# Patient Record
Sex: Female | Born: 1965 | Race: White | Hispanic: No | Smoking: Former smoker
Health system: Southern US, Community
[De-identification: ages and names within clinical notes are randomized; demographics above are authoritative.]

## PROBLEM LIST (undated history)

## (undated) DIAGNOSIS — N200 Calculus of kidney: Secondary | ICD-10-CM

## (undated) DIAGNOSIS — J45909 Unspecified asthma, uncomplicated: Secondary | ICD-10-CM

## (undated) DIAGNOSIS — J449 Chronic obstructive pulmonary disease, unspecified: Secondary | ICD-10-CM

## (undated) DIAGNOSIS — F329 Major depressive disorder, single episode, unspecified: Secondary | ICD-10-CM

## (undated) DIAGNOSIS — F32A Depression, unspecified: Secondary | ICD-10-CM

## (undated) DIAGNOSIS — K219 Gastro-esophageal reflux disease without esophagitis: Secondary | ICD-10-CM

## (undated) HISTORY — DX: Depression, unspecified: F32.A

## (undated) HISTORY — DX: Unspecified asthma, uncomplicated: J45.909

## (undated) HISTORY — DX: Gastro-esophageal reflux disease without esophagitis: K21.9

## (undated) HISTORY — DX: Chronic obstructive pulmonary disease, unspecified: J44.9

## (undated) HISTORY — DX: Calculus of kidney: N20.0

---
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
## 1998-01-10 ENCOUNTER — Other Ambulatory Visit: Admission: RE | Admit: 1998-01-10 | Discharge: 1998-01-10 | Payer: Self-pay | Admitting: Family Medicine

## 1999-02-14 ENCOUNTER — Other Ambulatory Visit: Admission: RE | Admit: 1999-02-14 | Discharge: 1999-02-14 | Payer: Self-pay

## 2000-03-15 ENCOUNTER — Other Ambulatory Visit: Admission: RE | Admit: 2000-03-15 | Discharge: 2000-03-15 | Payer: Self-pay

## 2001-03-17 ENCOUNTER — Other Ambulatory Visit: Admission: RE | Admit: 2001-03-17 | Discharge: 2001-03-17 | Payer: Self-pay | Admitting: Family Medicine

## 2004-07-03 ENCOUNTER — Other Ambulatory Visit: Admission: RE | Admit: 2004-07-03 | Discharge: 2004-07-03 | Payer: Self-pay | Admitting: Family Medicine

## 2004-07-03 ENCOUNTER — Ambulatory Visit: Payer: Self-pay | Admitting: Family Medicine

## 2004-12-01 ENCOUNTER — Ambulatory Visit: Payer: Self-pay | Admitting: Family Medicine

## 2004-12-26 ENCOUNTER — Ambulatory Visit: Payer: Self-pay | Admitting: Family Medicine

## 2005-09-23 ENCOUNTER — Ambulatory Visit: Payer: Self-pay | Admitting: Family Medicine

## 2005-09-23 ENCOUNTER — Other Ambulatory Visit: Admission: RE | Admit: 2005-09-23 | Discharge: 2005-09-23 | Payer: Self-pay | Admitting: Family Medicine

## 2012-08-17 HISTORY — PX: HERNIA REPAIR: SHX51

## 2018-08-23 DIAGNOSIS — F331 Major depressive disorder, recurrent, moderate: Secondary | ICD-10-CM | POA: Insufficient documentation

## 2018-08-23 HISTORY — DX: Major depressive disorder, recurrent, moderate: F33.1

## 2018-10-04 LAB — HM MAMMOGRAPHY: HM Mammogram: NORMAL (ref 0–4)

## 2018-12-14 DIAGNOSIS — E039 Hypothyroidism, unspecified: Secondary | ICD-10-CM

## 2018-12-14 DIAGNOSIS — K219 Gastro-esophageal reflux disease without esophagitis: Secondary | ICD-10-CM | POA: Insufficient documentation

## 2018-12-14 HISTORY — DX: Hypothyroidism, unspecified: E03.9

## 2018-12-14 HISTORY — DX: Gastro-esophageal reflux disease without esophagitis: K21.9

## 2019-09-08 DIAGNOSIS — F331 Major depressive disorder, recurrent, moderate: Secondary | ICD-10-CM

## 2019-09-08 DIAGNOSIS — E038 Other specified hypothyroidism: Secondary | ICD-10-CM

## 2019-09-26 ENCOUNTER — Ambulatory Visit: Payer: BC Managed Care – PPO | Admitting: Physician Assistant

## 2019-09-26 ENCOUNTER — Encounter: Payer: Self-pay | Admitting: Physician Assistant

## 2019-09-26 ENCOUNTER — Other Ambulatory Visit: Payer: Self-pay

## 2019-09-26 VITALS — BP 140/80 | HR 84 | Temp 97.5°F | Resp 16 | Ht 65.0 in | Wt 270.0 lb

## 2019-09-26 DIAGNOSIS — E782 Mixed hyperlipidemia: Secondary | ICD-10-CM

## 2019-09-26 DIAGNOSIS — F331 Major depressive disorder, recurrent, moderate: Secondary | ICD-10-CM

## 2019-09-26 DIAGNOSIS — K219 Gastro-esophageal reflux disease without esophagitis: Secondary | ICD-10-CM | POA: Diagnosis not present

## 2019-09-26 DIAGNOSIS — J4520 Mild intermittent asthma, uncomplicated: Secondary | ICD-10-CM

## 2019-09-26 DIAGNOSIS — Z1231 Encounter for screening mammogram for malignant neoplasm of breast: Secondary | ICD-10-CM

## 2019-09-26 DIAGNOSIS — E039 Hypothyroidism, unspecified: Secondary | ICD-10-CM | POA: Diagnosis not present

## 2019-09-26 DIAGNOSIS — Z1211 Encounter for screening for malignant neoplasm of colon: Secondary | ICD-10-CM

## 2019-09-26 HISTORY — DX: Mild intermittent asthma, uncomplicated: J45.20

## 2019-09-26 HISTORY — DX: Encounter for screening mammogram for malignant neoplasm of breast: Z12.31

## 2019-09-26 HISTORY — DX: Mixed hyperlipidemia: E78.2

## 2019-09-26 MED ORDER — ALBUTEROL SULFATE HFA 108 (90 BASE) MCG/ACT IN AERS: 2.0000 | INHALATION_SPRAY | Freq: Four times a day (QID) | RESPIRATORY_TRACT | 5 refills | Status: DC | PRN

## 2019-09-26 MED ORDER — BUDESONIDE-FORMOTEROL FUMARATE 160-4.5 MCG/ACT IN AERO: 2.0000 | INHALATION_SPRAY | Freq: Two times a day (BID) | RESPIRATORY_TRACT | 3 refills | Status: DC

## 2019-09-26 NOTE — Progress Notes (Signed)
Established Patient Office Visit  Subjective:  Patient ID: Kristen Vazquez, female    DOB: [birth year]  Age: 54 y.o. MRN: [id number]  CC:  Chief Complaint  Patient presents with  . Follow-up  . COPD  . Depression    HPI Kristen Vazquez presents for chronic follow up of depression, GERD, asthma , hyperlipidemia and hypothyroidism  Pt currently on levothyroxine 53mg - she is due for labwork - voices no problems or concerns  Pt taking lexapro for anxiety/depression - states the med working well for her   Pt taking omeprazole 243mqd for GERD - is having no breakthrough symptoms  Pt with history of asthma - she has been on inhalers in the past and requests refill because she has had some intermittent wheezing that has bothered her especially with the colder weather - no acute illness today  Past Medical History:  Diagnosis Date  . Asthma   . [place]  . Depression   . GERD (gastroesophageal reflux disease)   . Kidney stones     Past Surgical History:  Procedure Laterality Date  . HERNIA REPAIR  2014    Family History  Problem Relation Age of Onset  . Breast cancer Mother   . Leukemia Father   . Heart failure Brother   . Alcohol abuse Brother     Social History   Socioeconomic History  . Marital status: Widowed    Spouse name: Not on file  . Number of children: 1  . Years of education: Not on file  . Highest education level: Not on file  Occupational History  . Occupation: [employer]  Tobacco Use  . Smoking status: Former Smoker    Types: Cigarettes    Quit date: 2001    Years since quitting: 20.1  . Smokeless tobacco: Never Used  Substance and Sexual Activity  . Alcohol use: Yes    Comment: SOCIAL  . Drug use: Never  . Sexual activity: Not on file  Other Topics Concern  . Not on file  Social History Narrative  . Not on file   Social Determinants of Health   Financial Resource Strain:   .  Difficulty of Paying Living Expenses: Not on file  Food Insecurity:   . Worried About RuCharity fundraisern the Last Year: Not on file  . Ran Out of Food in the Last Year: Not on file  Transportation Needs:   . Lack of Transportation (Medical): Not on file  . Lack of Transportation (Non-Medical): Not on file  Physical Activity:   . Days of Exercise per Week: Not on file  . Minutes of Exercise per Session: Not on file  Stress:   . Feeling of Stress : Not on file  Social Connections:   . Frequency of Communication with Friends and Family: Not on file  . Frequency of Social Gatherings with Friends and Family: Not on file  . Attends Religious Services: Not on file  . Active Member of Clubs or Organizations: Not on file  . Attends ClArchivisteetings: Not on file  . Marital Status: Not on file  Intimate Partner Violence:   . Fear of Current or Ex-Partner: Not on file  . Emotionally Abused: Not on file  . Physically Abused: Not on file  . Sexually Abused: Not on file     Current Outpatient Medications:  .  escitalopram (LEXAPRO) 20 MG tablet, Take 20 mg by  mouth daily., Disp: , Rfl:  .  levothyroxine (SYNTHROID) 75 MCG tablet, Take 75 mcg by mouth daily before breakfast., Disp: , Rfl:  .  Melatonin 5 MG TABS, Take 1 tablet by mouth at bedtime., Disp: , Rfl:  .  meloxicam (MOBIC) 7.5 MG tablet, Take 7.5 mg by mouth daily. FOR FOOT PAIN, Disp: , Rfl:  .  omeprazole (PRILOSEC) 20 MG capsule, Take 20 mg by mouth daily., Disp: , Rfl:  .  albuterol (VENTOLIN HFA) 108 (90 Base) MCG/ACT inhaler, Inhale 2 puffs into the lungs every 6 (six) hours as needed for wheezing or shortness of breath., Disp: 8 g, Rfl: 5 .  budesonide-formoterol (SYMBICORT) 160-4.5 MCG/ACT inhaler, Inhale 2 puffs into the lungs 2 (two) times daily., Disp: 1 Inhaler, Rfl: 3   Allergies  Allergen Reactions  . Codeine   . Sulfa Antibiotics Rash    ROS CONSTITUTIONAL: Negative for chills, fatigue, fever,  unintentional weight gain and unintentional weight loss.  E/N/T: Negative for ear pain, nasal congestion and sore throat.  CARDIOVASCULAR: Negative for chest pain, dizziness, palpitations and pedal edema.  RESPIRATORY: Negative for recent cough and dyspnea. See HPI GASTROINTESTINAL: Negative for abdominal pain, acid reflux symptoms, constipation, diarrhea, nausea and vomiting.  MSK: Negative for arthralgias and myalgias.  INTEGUMENTARY: Negative for rash.  NEUROLOGICAL: Negative for dizziness and headaches.  PSYCHIATRIC: Negative for sleep disturbance and to question depression screen.  Negative for depression, negative for anhedonia.        Objective:    PHYSICAL EXAM:   VS: BP 140/80   Pulse 84   Temp (!) 97.5 F (36.4 C)   Resp 16   Ht 5' 5"  (1.651 m)   Wt 270 lb (122.5 kg)   SpO2 98%   BMI 44.93 kg/m   GEN: Well nourished, well developed, in no acute distress  HEENT: normal external ears and nose - normal external auditory canals and TMS - hearing grossly normal - normal nasal mucosa and septum - Lips, Teeth and Gums - normal  Oropharynx - normal mucosa, palate, and posterior pharynx Neck: no JVD or masses - no thyromegaly Cardiac: RRR; no murmurs, rubs, or gallops,no edema - no significant varicosities Respiratory:  normal respiratory rate and pattern with no distress - normal breath sounds with no rales, rhonchi, wheezes or rubs Skin: warm and dry, no rash  Neuro:  Alert and Oriented x 3, Strength and sensation are intact - CN II-Xii grossly intact Psych: euthymic mood, appropriate affect and demeanor  BP 140/80   Pulse 84   Temp (!) 97.5 F (36.4 C)   Resp 16   Ht 5' 5"  (1.651 m)   Wt 270 lb (122.5 kg)   SpO2 98%   BMI 44.93 kg/m  Wt Readings from Last 3 Encounters:  09/26/19 270 lb (122.5 kg)     Health Maintenance Due  Topic Date Due  . HIV Screening  02/14/1982  . TETANUS/TDAP  02/14/1986  . PAP SMEAR-Modifier  02/15/1988  . COLONOSCOPY  02/14/2017   . INFLUENZA VACCINE  03/18/2019    There are no preventive care reminders to display for this patient.  No results found for: TSH No results found for: WBC, HGB, HCT, MCV, PLT No results found for: NA, K, CHLORIDE, CO2, GLUCOSE, BUN, CREATININE, BILITOT, ALKPHOS, AST, ALT, PROT, ALBUMIN, CALCIUM, ANIONGAP, EGFR, GFR No results found for: CHOL No results found for: HDL No results found for: LDLCALC No results found for: TRIG No results found for: CHOLHDL No  results found for: HGBA1C    Assessment & Plan:   Problem List Items Addressed This Visit      Respiratory   Mild intermittent asthma   Relevant Medications   albuterol (VENTOLIN HFA) 108 (90 Base) MCG/ACT inhaler   budesonide-formoterol (SYMBICORT) 160-4.5 MCG/ACT inhaler     Digestive   Gastroesophageal reflux disease without esophagitis - Primary   Relevant Orders   CBC with Differential/Platelet   Comprehensive metabolic panel     Endocrine   Acquired hypothyroidism   Relevant Orders   TSH     Other   Major depressive disorder, recurrent episode, moderate (HCC)   Encounter for screening mammogram for breast cancer   Relevant Orders   MM Digital Screening   Mixed hyperlipidemia   Relevant Orders   CBC with Differential/Platelet   Comprehensive metabolic panel   Lipid panel      Meds ordered this encounter  Medications  . albuterol (VENTOLIN HFA) 108 (90 Base) MCG/ACT inhaler    Sig: Inhale 2 puffs into the lungs every 6 (six) hours as needed for wheezing or shortness of breath.    Dispense:  8 g    Refill:  5    Order Specific Question:   Supervising Provider    Answer:   Marge Duncans [id number]  . budesonide-formoterol (SYMBICORT) 160-4.5 MCG/ACT inhaler    Sig: Inhale 2 puffs into the lungs 2 (two) times daily.    Dispense:  1 Inhaler    Refill:  3    Order Specific Question:   Supervising Provider    AnswerMarge Duncans [id number]    Follow-up: Return in about 3 months (around 12/24/2019).     SARA R Josilynn Losh, PA-C

## 2019-09-26 NOTE — Assessment & Plan Note (Signed)
Continue lexapro 20mg  qd

## 2019-09-26 NOTE — Assessment & Plan Note (Signed)
TSH pending Continue current does of synthroid

## 2019-09-26 NOTE — Assessment & Plan Note (Signed)
rx for albuterol and symbicort - take as directed

## 2019-09-26 NOTE — Assessment & Plan Note (Signed)
Continue omeprazole 20 mg qd

## 2019-09-26 NOTE — Assessment & Plan Note (Signed)
Mammogram to be scheduled 

## 2019-09-26 NOTE — Assessment & Plan Note (Signed)
Recommend low fat/low chol diet Will evaluate to see if meds indicated when labwork back

## 2019-09-27 ENCOUNTER — Other Ambulatory Visit: Payer: Self-pay | Admitting: Physician Assistant

## 2019-09-27 DIAGNOSIS — E038 Other specified hypothyroidism: Secondary | ICD-10-CM

## 2019-09-27 LAB — CBC WITH DIFFERENTIAL/PLATELET
Basophils Absolute: 0.1 10*3/uL (ref 0.0–0.2)
Basos: 1 %
EOS (ABSOLUTE): 0.3 10*3/uL (ref 0.0–0.4)
Eos: 3 %
Hematocrit: 42.6 % (ref 34.0–46.6)
Hemoglobin: 13.3 g/dL (ref 11.1–15.9)
Immature Grans (Abs): 0.1 10*3/uL (ref 0.0–0.1)
Immature Granulocytes: 1 %
Lymphocytes Absolute: 2.7 10*3/uL (ref 0.7–3.1)
Lymphs: 24 %
MCH: 26.6 pg (ref 26.6–33.0)
MCHC: 31.2 g/dL — ABNORMAL LOW (ref 31.5–35.7)
MCV: 85 fL (ref 79–97)
Monocytes Absolute: 0.7 10*3/uL (ref 0.1–0.9)
Monocytes: 6 %
Neutrophils Absolute: 7.3 10*3/uL — ABNORMAL HIGH (ref 1.4–7.0)
Neutrophils: 65 %
Platelets: 254 10*3/uL (ref 150–450)
RBC: 5 x10E6/uL (ref 3.77–5.28)
RDW: 14 % (ref 11.7–15.4)
WBC: 11.1 10*3/uL — ABNORMAL HIGH (ref 3.4–10.8)

## 2019-09-27 LAB — COMPREHENSIVE METABOLIC PANEL
ALT: 60 IU/L — ABNORMAL HIGH (ref 0–32)
AST: 48 IU/L — ABNORMAL HIGH (ref 0–40)
Albumin/Globulin Ratio: 1.3 (ref 1.2–2.2)
Albumin: 3.9 g/dL (ref 3.8–4.9)
Alkaline Phosphatase: 140 IU/L — ABNORMAL HIGH (ref 39–117)
BUN/Creatinine Ratio: 16 (ref 9–23)
BUN: 10 mg/dL (ref 6–24)
Bilirubin Total: 0.2 mg/dL (ref 0.0–1.2)
CO2: 23 mmol/L (ref 20–29)
Calcium: 9.1 mg/dL (ref 8.7–10.2)
Chloride: 101 mmol/L (ref 96–106)
Creatinine, Ser: 0.63 mg/dL (ref 0.57–1.00)
GFR calc Af Amer: 119 mL/min/{1.73_m2} (ref >59)
GFR calc non Af Amer: 103 mL/min/{1.73_m2} (ref >59)
Globulin, Total: 3 g/dL (ref 1.5–4.5)
Glucose: 117 mg/dL — ABNORMAL HIGH (ref 65–99)
Potassium: 4.7 mmol/L (ref 3.5–5.2)
Sodium: 140 mmol/L (ref 134–144)
Total Protein: 6.9 g/dL (ref 6.0–8.5)

## 2019-09-27 LAB — CARDIOVASCULAR RISK ASSESSMENT

## 2019-09-27 LAB — LIPID PANEL
Chol/HDL Ratio: 4.2 ratio (ref 0.0–4.4)
Cholesterol, Total: 199 mg/dL (ref 100–199)
HDL: 47 mg/dL (ref >39)
LDL Chol Calc (NIH): 128 mg/dL — ABNORMAL HIGH (ref 0–99)
Triglycerides: 134 mg/dL (ref 0–149)
VLDL Cholesterol Cal: 24 mg/dL (ref 5–40)

## 2019-09-27 LAB — TSH: TSH: 6.02 u[IU]/mL — ABNORMAL HIGH (ref 0.450–4.500)

## 2019-09-27 MED ORDER — LEVOTHYROXINE SODIUM 88 MCG PO TABS: 88.0000 ug | ORAL_TABLET | Freq: Every day | ORAL | 0 refills | Status: DC

## 2019-12-26 ENCOUNTER — Other Ambulatory Visit: Payer: Self-pay | Admitting: Physician Assistant

## 2020-01-02 ENCOUNTER — Ambulatory Visit: Payer: BC Managed Care – PPO | Admitting: Physician Assistant

## 2020-01-02 ENCOUNTER — Encounter: Payer: Self-pay | Admitting: Physician Assistant

## 2020-01-02 ENCOUNTER — Other Ambulatory Visit: Payer: Self-pay

## 2020-01-02 VITALS — BP 136/78 | HR 73 | Temp 97.3°F | Resp 16 | Wt 267.0 lb

## 2020-01-02 DIAGNOSIS — E782 Mixed hyperlipidemia: Secondary | ICD-10-CM

## 2020-01-02 DIAGNOSIS — Z1231 Encounter for screening mammogram for malignant neoplasm of breast: Secondary | ICD-10-CM

## 2020-01-02 DIAGNOSIS — J060 Acute laryngopharyngitis: Secondary | ICD-10-CM

## 2020-01-02 DIAGNOSIS — E039 Hypothyroidism, unspecified: Secondary | ICD-10-CM | POA: Diagnosis not present

## 2020-01-02 DIAGNOSIS — K219 Gastro-esophageal reflux disease without esophagitis: Secondary | ICD-10-CM | POA: Diagnosis not present

## 2020-01-02 DIAGNOSIS — R799 Abnormal finding of blood chemistry, unspecified: Secondary | ICD-10-CM | POA: Insufficient documentation

## 2020-01-02 DIAGNOSIS — J4520 Mild intermittent asthma, uncomplicated: Secondary | ICD-10-CM

## 2020-01-02 HISTORY — DX: Acute laryngopharyngitis: J06.0

## 2020-01-02 HISTORY — DX: Abnormal finding of blood chemistry, unspecified: R79.9

## 2020-01-02 MED ORDER — ESCITALOPRAM OXALATE 20 MG PO TABS: 20.0000 mg | ORAL_TABLET | Freq: Every day | ORAL | 1 refills | Status: DC

## 2020-01-02 MED ORDER — AZITHROMYCIN 250 MG PO TABS: ORAL_TABLET | ORAL | 0 refills | Status: DC

## 2020-01-02 NOTE — Assessment & Plan Note (Signed)
labwork pending °Continue current meds °

## 2020-01-02 NOTE — Assessment & Plan Note (Signed)
Continue meds as directed

## 2020-01-02 NOTE — Assessment & Plan Note (Signed)
hgb a1c pending ?

## 2020-01-02 NOTE — Assessment & Plan Note (Signed)
otc decongestants rx for zpack as directed

## 2020-01-02 NOTE — Assessment & Plan Note (Signed)
Well controlled.  ?No changes to medicines.  ?Continue to work on eating a healthy diet and exercise.  ?Labs drawn today.  ?

## 2020-01-02 NOTE — Progress Notes (Signed)
Established Patient Office Visit  Subjective:  Patient ID: Kristen Vazquez, female    DOB: [birth year]  Age: 54 y.o. MRN: [id number]  CC:  Chief Complaint  Patient presents with  . Hypothyroidism  . Hyperlipidemia    HPI [name] presents for chronic follow up of depression, GERD, asthma , hyperlipidemia and hypothyroidism  Pt currently on levothyroxine - she is due for labwork - voices no problems or concerns besides concerned about weight gain  Pt taking lexapro 20mg  qd for anxiety/depression - states the med working well for her  And requests refill of med  Pt taking omeprazole 20mg  qd for GERD - is having no breakthrough symptoms  Pt states for the past few days she has had sore throat, ear congestion and pnd - denies fever or cough. No malaise  Past Medical History:  Diagnosis Date  . Asthma   . COPD (chronic obstructive pulmonary disease) (HCC)   . Depression   . GERD (gastroesophageal reflux disease)   . Kidney stones     Past Surgical History:  Procedure Laterality Date  . HERNIA REPAIR  2014    Family History  Problem Relation Age of Onset  . Breast cancer Mother   . Leukemia Father   . Heart failure Brother   . Alcohol abuse Brother     Social History   Socioeconomic History  . Marital status: Widowed    Spouse name: Not on file  . Number of children: 1  . Years of education: Not on file  . Highest education level: Not on file  Occupational History  . Occupation: [employer]  Tobacco Use  . Smoking status: Former Smoker    Types: Cigarettes    Quit date: 2001    Years since quitting: 20.3  . Smokeless tobacco: Never Used  Substance and Sexual Activity  . Alcohol use: Yes    Comment: SOCIAL  . Drug use: Never  . Sexual activity: Not on file  Other Topics Concern  . Not on file  Social History Narrative  . Not on file   Social Determinants of Health   Financial Resource Strain:   . Difficulty of Paying Living  Expenses:   Food Insecurity:   . Worried About Management consultant in the Last Year:   . 2002 in the Last Year:   Transportation Needs:   . Programme researcher, broadcasting/film/video (Medical):   Barista Lack of Transportation (Non-Medical):   Physical Activity:   . Days of Exercise per Week:   . Minutes of Exercise per Session:   Stress:   . Feeling of Stress :   Social Connections:   . Frequency of Communication with Friends and Family:   . Frequency of Social Gatherings with Friends and Family:   . Attends Religious Services:   . Active Member of Clubs or Organizations:   . Attends Freight forwarder Meetings:   Marland Kitchen Marital Status:   Intimate Partner Violence:   . Fear of Current or Ex-Partner:   . Emotionally Abused:   Banker Physically Abused:   . Sexually Abused:      Current Outpatient Medications:  .  albuterol (VENTOLIN HFA) 108 (90 Base) MCG/ACT inhaler, Inhale 2 puffs into the lungs every 6 (six) hours as needed for wheezing or shortness of breath., Disp: 8 g, Rfl: 5 .  azithromycin (ZITHROMAX) 250 MG tablet, 2 po day one then 1 po days 2-5, Disp: 6 tablet, Rfl:  0 .  escitalopram (LEXAPRO) 20 MG tablet, Take 1 tablet (20 mg total) by mouth daily., Disp: 90 tablet, Rfl: 1 .  levothyroxine (SYNTHROID) 88 MCG tablet, Take 1 tablet (88 mcg total) by mouth daily., Disp: 90 tablet, Rfl: 0 .  Melatonin 5 MG TABS, Take 1 tablet by mouth at bedtime., Disp: , Rfl:  .  meloxicam (MOBIC) 7.5 MG tablet, Take 7.5 mg by mouth daily. FOR FOOT PAIN, Disp: , Rfl:  .  omeprazole (PRILOSEC) 20 MG capsule, Take 20 mg by mouth daily., Disp: , Rfl:    Allergies  Allergen Reactions  . Codeine   . Sulfa Antibiotics Rash    ROS CONSTITUTIONAL: Negative for chills, fatigue, fever, unintentional weight gain and unintentional weight loss.  E/N/T:see HPI CARDIOVASCULAR: Negative for chest pain, dizziness, palpitations and pedal edema.  RESPIRATORY: Negative for recent cough and dyspnea. MSK: Negative for  arthralgias and myalgias.  INTEGUMENTARY: Negative for rash.    PSYCHIATRIC: Negative for sleep disturbance and to question depression screen.  Negative for depression, negative for anhedonia.        Objective:    PHYSICAL EXAM:   VS: BP 136/78   Pulse 73   Temp (!) 97.3 F (36.3 C)   Resp 16   Wt 267 lb (121.1 kg)   SpO2 98%   BMI 44.43 kg/m   GEN: Well nourished, well developed, in no acute distress  HEENT: normal external ears and nose - normal external auditory canals and TMS - hearing grossly normal - normal nasal mucosa and septum - Lips, Teeth and Gums - normal  Oropharynx -erythema/pnd  Cardiac: RRR; no murmurs, rubs, or gallops,no edema -  Respiratory:  normal respiratory rate and pattern with no distress - normal breath sounds with no rales, rhonchi, wheezes or rubs Skin: warm and dry, no rash  t Psych: euthymic mood, appropriate affect and demeanor  BP 136/78   Pulse 73   Temp (!) 97.3 F (36.3 C)   Resp 16   Wt 267 lb (121.1 kg)   SpO2 98%   BMI 44.43 kg/m  Wt Readings from Last 3 Encounters:  01/02/20 267 lb (121.1 kg)  09/26/19 270 lb (122.5 kg)     Health Maintenance Due  Topic Date Due  . HIV Screening  Never done  . TETANUS/TDAP  Never done  . PAP SMEAR-Modifier  Never done  . COLONOSCOPY  Never done    There are no preventive care reminders to display for this patient.  Lab Results  Component Value Date   TSH 6.020 (H) 09/26/2019   Lab Results  Component Value Date   WBC 11.1 (H) 09/26/2019   HGB 13.3 09/26/2019   HCT 42.6 09/26/2019   MCV 85 09/26/2019   PLT 254 09/26/2019   Lab Results  Component Value Date   NA 140 09/26/2019   K 4.7 09/26/2019   CO2 23 09/26/2019   GLUCOSE 117 (H) 09/26/2019   BUN 10 09/26/2019   CREATININE 0.63 09/26/2019   BILITOT 0.2 09/26/2019   ALKPHOS 140 (H) 09/26/2019   AST 48 (H) 09/26/2019   ALT 60 (H) 09/26/2019   PROT 6.9 09/26/2019   ALBUMIN 3.9 09/26/2019   CALCIUM 9.1 09/26/2019    Lab Results  Component Value Date   CHOL 199 09/26/2019   Lab Results  Component Value Date   HDL 47 09/26/2019   Lab Results  Component Value Date   LDLCALC 128 (H) 09/26/2019   Lab Results  Component  Value Date   TRIG 134 09/26/2019   Lab Results  Component Value Date   CHOLHDL 4.2 09/26/2019   No results found for: HGBA1C    Assessment & Plan:   Problem List Items Addressed This Visit      Respiratory   Mild intermittent asthma    Continue meds as directed      Acute laryngopharyngitis    otc decongestants rx for zpack as directed        Digestive   Gastroesophageal reflux disease without esophagitis    Continue meds as directed        Endocrine   Acquired hypothyroidism - Primary    labwork pending Continue current meds      Relevant Orders   CBC with Differential/Platelet   Comprehensive metabolic panel   TSH     Other   Mixed hyperlipidemia    Well controlled.  No changes to medicines.  Continue to work on eating a healthy diet and exercise.  Labs drawn today.        Relevant Orders   CBC with Differential/Platelet   Comprehensive metabolic panel   Lipid panel   Abnormal blood chemistry    hgb a1c pending      Relevant Orders   Hemoglobin A1c    Other Visit Diagnoses    Visit for screening mammogram       Relevant Orders   MM Digital Screening      Meds ordered this encounter  Medications  . escitalopram (LEXAPRO) 20 MG tablet    Sig: Take 1 tablet (20 mg total) by mouth daily.    Dispense:  90 tablet    Refill:  1    Order Specific Question:   Supervising Provider    AnswerRochel Brome [id number]  . azithromycin (ZITHROMAX) 250 MG tablet    Sig: 2 po day one then 1 po days 2-5    Dispense:  6 tablet    Refill:  0    Order Specific Question:   Supervising Provider    AnswerShelton Silvas    Follow-up: Return in about 6 months (around 07/04/2020) for chronic fasting follow up.    SARA R DAVIS,  PA-C

## 2020-01-03 ENCOUNTER — Other Ambulatory Visit: Payer: Self-pay | Admitting: Physician Assistant

## 2020-01-03 DIAGNOSIS — E039 Hypothyroidism, unspecified: Secondary | ICD-10-CM

## 2020-01-03 DIAGNOSIS — R799 Abnormal finding of blood chemistry, unspecified: Secondary | ICD-10-CM

## 2020-01-03 LAB — HEMOGLOBIN A1C
Est. average glucose Bld gHb Est-mCnc: 128 mg/dL
Hgb A1c MFr Bld: 6.1 % — ABNORMAL HIGH (ref 4.8–5.6)

## 2020-01-03 LAB — CBC WITH DIFFERENTIAL/PLATELET
Basophils Absolute: 0 10*3/uL (ref 0.0–0.2)
Basos: 1 %
EOS (ABSOLUTE): 0.2 10*3/uL (ref 0.0–0.4)
Eos: 3 %
Hematocrit: 42.7 % (ref 34.0–46.6)
Hemoglobin: 13.2 g/dL (ref 11.1–15.9)
Immature Grans (Abs): 0 10*3/uL (ref 0.0–0.1)
Immature Granulocytes: 0 %
Lymphocytes Absolute: 2 10*3/uL (ref 0.7–3.1)
Lymphs: 23 %
MCH: 26.4 pg — ABNORMAL LOW (ref 26.6–33.0)
MCHC: 30.9 g/dL — ABNORMAL LOW (ref 31.5–35.7)
MCV: 85 fL (ref 79–97)
Monocytes Absolute: 0.7 10*3/uL (ref 0.1–0.9)
Monocytes: 8 %
Neutrophils Absolute: 5.8 10*3/uL (ref 1.4–7.0)
Neutrophils: 65 %
Platelets: 273 10*3/uL (ref 150–450)
RBC: 5 x10E6/uL (ref 3.77–5.28)
RDW: 14.1 % (ref 11.7–15.4)
WBC: 8.8 10*3/uL (ref 3.4–10.8)

## 2020-01-03 LAB — CARDIOVASCULAR RISK ASSESSMENT

## 2020-01-03 LAB — COMPREHENSIVE METABOLIC PANEL
ALT: 48 IU/L — ABNORMAL HIGH (ref 0–32)
AST: 41 IU/L — ABNORMAL HIGH (ref 0–40)
Albumin/Globulin Ratio: 1.4 (ref 1.2–2.2)
Albumin: 4 g/dL (ref 3.8–4.9)
Alkaline Phosphatase: 133 IU/L — ABNORMAL HIGH (ref 48–121)
BUN/Creatinine Ratio: 16 (ref 9–23)
BUN: 9 mg/dL (ref 6–24)
Bilirubin Total: 0.2 mg/dL (ref 0.0–1.2)
CO2: 24 mmol/L (ref 20–29)
Calcium: 8.9 mg/dL (ref 8.7–10.2)
Chloride: 97 mmol/L (ref 96–106)
Creatinine, Ser: 0.58 mg/dL (ref 0.57–1.00)
GFR calc Af Amer: 123 mL/min/{1.73_m2} (ref >59)
GFR calc non Af Amer: 106 mL/min/{1.73_m2} (ref >59)
Globulin, Total: 2.9 g/dL (ref 1.5–4.5)
Glucose: 112 mg/dL — ABNORMAL HIGH (ref 65–99)
Potassium: 4.2 mmol/L (ref 3.5–5.2)
Sodium: 135 mmol/L (ref 134–144)
Total Protein: 6.9 g/dL (ref 6.0–8.5)

## 2020-01-03 LAB — TSH: TSH: 4.55 u[IU]/mL — ABNORMAL HIGH (ref 0.450–4.500)

## 2020-01-03 LAB — LIPID PANEL
Chol/HDL Ratio: 4.2 ratio (ref 0.0–4.4)
Cholesterol, Total: 207 mg/dL — ABNORMAL HIGH (ref 100–199)
HDL: 49 mg/dL (ref >39)
LDL Chol Calc (NIH): 138 mg/dL — ABNORMAL HIGH (ref 0–99)
Triglycerides: 114 mg/dL (ref 0–149)
VLDL Cholesterol Cal: 20 mg/dL (ref 5–40)

## 2020-01-03 MED ORDER — LEVOTHYROXINE SODIUM 100 MCG PO TABS: 100.0000 ug | ORAL_TABLET | Freq: Every day | ORAL | 0 refills | Status: DC

## 2020-02-13 ENCOUNTER — Encounter: Payer: Self-pay | Admitting: Physician Assistant

## 2020-02-13 LAB — COLOGUARD: Cologuard: NEGATIVE

## 2020-02-16 LAB — COLOGUARD: COLOGUARD: NEGATIVE

## 2020-03-26 ENCOUNTER — Encounter: Payer: Self-pay | Admitting: Legal Medicine

## 2020-03-26 ENCOUNTER — Other Ambulatory Visit: Payer: Self-pay

## 2020-03-26 ENCOUNTER — Ambulatory Visit: Payer: BC Managed Care – PPO | Admitting: Legal Medicine

## 2020-03-26 VITALS — BP 110/70 | HR 90 | Temp 97.4°F | Resp 17 | Ht 65.5 in | Wt 270.0 lb

## 2020-03-26 DIAGNOSIS — E039 Hypothyroidism, unspecified: Secondary | ICD-10-CM | POA: Diagnosis not present

## 2020-03-26 DIAGNOSIS — F331 Major depressive disorder, recurrent, moderate: Secondary | ICD-10-CM

## 2020-03-26 DIAGNOSIS — N951 Menopausal and female climacteric states: Secondary | ICD-10-CM

## 2020-03-26 MED ORDER — ZOLPIDEM TARTRATE 10 MG PO TABS: 10.0000 mg | ORAL_TABLET | Freq: Every evening | ORAL | 2 refills | Status: DC | PRN

## 2020-03-26 MED ORDER — ESCITALOPRAM OXALATE 20 MG PO TABS: 20.0000 mg | ORAL_TABLET | Freq: Every day | ORAL | 2 refills | Status: DC

## 2020-03-26 NOTE — Progress Notes (Signed)
Subjective:  Patient ID: Kristen Vazquez, female    DOB: Jul 15, 1966  Age: 54 y.o. MRN: [id number]  Chief Complaint  Patient presents with  . Anxiety    She feels very anxious lately.    HPI: patient is feeling bad and missed work.  Husband diet 2 years ago.  Last month feels overwhelmed.  She is getting anxiety attack.    She feels out of control.  She is having vivid dreams.  She did hope therapy.    Patient has HYPOTHYROIDISM.  Diagnosed 10 years ago.  Patient has stable thyroid readings.  Patient is having no symptoms.  Last TSH was normal.  continue dosage of thyroid medicine.  This patient has major depression for 2 years.  PHQ9 =17.  Patient is having more anhedonia.  The patient has less future plans and prospects.  The depression is worse with stress.  The patient is is not exercising and working on behavior to improve mental health.  Patient is not seeing a therapist or psychiatrist.  na  Patient is on lexapro.   Current Outpatient Medications on File Prior to Visit  Medication Sig Dispense Refill  . albuterol (VENTOLIN HFA) 108 (90 Base) MCG/ACT inhaler Inhale 2 puffs into the lungs every 6 (six) hours as needed for wheezing or shortness of breath. 8 g 5  . levothyroxine (SYNTHROID) 100 MCG tablet Take 1 tablet (100 mcg total) by mouth daily. 90 tablet 0  . Melatonin 5 MG TABS Take 1 tablet by mouth at bedtime.    . meloxicam (MOBIC) 7.5 MG tablet Take 7.5 mg by mouth daily. FOR FOOT PAIN    . omeprazole (PRILOSEC) 20 MG capsule Take 20 mg by mouth daily.     No current facility-administered medications on file prior to visit.   Past Medical History:  Diagnosis Date  . Asthma   . COPD (chronic obstructive pulmonary disease) (HCC)   . Depression   . GERD (gastroesophageal reflux disease)   . Kidney stones    Past Surgical History:  Procedure Laterality Date  . HERNIA REPAIR  2014    Family History  Problem Relation Age of Onset  . Breast cancer Mother   .  Leukemia Father   . Heart failure Brother   . Alcohol abuse Brother    Social History   Socioeconomic History  . Marital status: Widowed    Spouse name: Not on file  . Number of children: 1  . Years of education: Not on file  . Highest education level: Not on file  Occupational History  . Occupation: [employer]  Tobacco Use  . Smoking status: Former Smoker    Types: Cigarettes    Quit date: 2001    Years since quitting: 20.6  . Smokeless tobacco: Never Used  Vaping Use  . Vaping Use: Never used  Substance and Sexual Activity  . Alcohol use: Yes    Alcohol/week: 2.0 standard drinks    Types: 2 Glasses of wine per week    Comment: SOCIAL  . Drug use: Never  . Sexual activity: Not Currently  Other Topics Concern  . Not on file  Social History Narrative  . Not on file   Social Determinants of Health   Financial Resource Strain:   . Difficulty of Paying Living Expenses:   Food Insecurity:   . Worried About Programme researcher, broadcasting/film/video in the Last Year:   . [employer] of Food in the Last Year:  Transportation Needs:   . Freight forwarder (Medical):   Marland Kitchen Lack of Transportation (Non-Medical):   Physical Activity:   . Days of Exercise per Week:   . Minutes of Exercise per Session:   Stress:   . Feeling of Stress :   Social Connections:   . Frequency of Communication with Friends and Family:   . Frequency of Social Gatherings with Friends and Family:   . Attends Religious Services:   . Active Member of Clubs or Organizations:   . Attends Banker Meetings:   Marland Kitchen Marital Status:     Review of Systems  Constitutional: Negative.   HENT: Negative.   Eyes: Negative.   Respiratory: Negative.   Cardiovascular: Negative.   Gastrointestinal: Negative.   Genitourinary: Negative.   Musculoskeletal: Negative.   Skin: Negative.   Neurological: Negative.   Psychiatric/Behavioral: Positive for agitation and dysphoric mood.     Objective:  BP 110/70 (BP  Location: Right Arm, Patient Position: Sitting)   Pulse 90   Temp (!) 97.4 F (36.3 C) (Temporal)   Resp 17   Ht 5' 5.5" (1.664 m)   Wt 270 lb (122.5 kg)   SpO2 97%   BMI 44.25 kg/m   BP/Weight 03/26/2020 01/02/2020 09/26/2019  Systolic BP 110 136 140  Diastolic BP 70 78 80  Wt. (Lbs) 270 267 270  BMI 44.25 44.43 44.93    Physical Exam Vitals reviewed.  Constitutional:      Appearance: Normal appearance.  HENT:     Head: Normocephalic and atraumatic.     Right Ear: Tympanic membrane normal.     Left Ear: Tympanic membrane normal.     Mouth/Throat:     Mouth: Mucous membranes are moist.     Pharynx: Oropharynx is clear.  Eyes:     Conjunctiva/sclera: Conjunctivae normal.     Pupils: Pupils are equal, round, and reactive to light.  Cardiovascular:     Rate and Rhythm: Normal rate and regular rhythm.     Pulses: Normal pulses.     Heart sounds: Normal heart sounds.  Pulmonary:     Effort: Pulmonary effort is normal.     Breath sounds: Normal breath sounds.  Abdominal:     General: Abdomen is flat. Bowel sounds are normal.     Palpations: Abdomen is soft.  Musculoskeletal:     Cervical back: Normal range of motion and neck supple.     Comments: Leg muscle weakness  Skin:    General: Skin is warm and dry.     Capillary Refill: Capillary refill takes less than 2 seconds.  Neurological:     Mental Status: She is alert and oriented to person, place, and time.  Psychiatric:        Mood and Affect: Mood normal.        Thought Content: Thought content normal.       Lab Results  Component Value Date   WBC 8.8 01/02/2020   HGB 13.2 01/02/2020   HCT 42.7 01/02/2020   PLT 273 01/02/2020   GLUCOSE 112 (H) 01/02/2020   CHOL 207 (H) 01/02/2020   TRIG 114 01/02/2020   HDL 49 01/02/2020   LDLCALC 138 (H) 01/02/2020   ALT 48 (H) 01/02/2020   AST 41 (H) 01/02/2020   NA 135 01/02/2020   K 4.2 01/02/2020   CL 97 01/02/2020   CREATININE 0.58 01/02/2020   BUN 9 01/02/2020    CO2 24 01/02/2020   TSH 4.550 (H) 01/02/2020  HGBA1C 6.1 (H) 01/02/2020      Assessment & Plan:   1. Perimenopausal - FSH/LH Patient is having irregular periods, and is perimenopausal  2. Acquired hypothyroidism - TSH Patient is known to have hypothyroidism and is n treatment with levothyroidism.  Patient was diagnosed 10 years ago.  Other treatment includes none.  Patient is compliant with medicines and last TSH 6 months ago.  Last TSH was normal.  3. Major depressive disorder, recurrent episode, moderate (HCC) - escitalopram (LEXAPRO) 20 MG tablet; Take 1 tablet (20 mg total) by mouth daily.  Dispense: 90 tablet; Refill: 2 - zolpidem (AMBIEN) 10 MG tablet; Take 1 tablet (10 mg total) by mouth at bedtime as needed.  Dispense: 15 tablet; Refill: 2 Patient's depression is poorly controlled with lexapro.   Anhedonia worse.  PHQ 9 was performed score 17. An individual care plan was established or reinforced today.  The patient's disease status was assessed using clinical findings on exam, labs, and or other diagnostic testing to determine patient's success in meeting treatment goals based on disease specific evidence-based guidelines and found to be worsening Recommendations include stay on medicine    Meds ordered this encounter  Medications  . escitalopram (LEXAPRO) 20 MG tablet    Sig: Take 1 tablet (20 mg total) by mouth daily.    Dispense:  90 tablet    Refill:  2  . zolpidem (AMBIEN) 10 MG tablet    Sig: Take 1 tablet (10 mg total) by mouth at bedtime as needed.    Dispense:  15 tablet    Refill:  2    Orders Placed This Encounter  Procedures  . TSH  . FSH/LH     Follow-up: Return in about 2 weeks (around 04/09/2020) for depression.  An After Visit Summary was printed and given to the patient.  Brent Bulla [facility] [phone]

## 2020-03-27 ENCOUNTER — Other Ambulatory Visit: Payer: Self-pay | Admitting: Legal Medicine

## 2020-03-27 DIAGNOSIS — E039 Hypothyroidism, unspecified: Secondary | ICD-10-CM

## 2020-03-27 LAB — FSH/LH
FSH: 18.1 m[IU]/mL
LH: 6.6 m[IU]/mL

## 2020-03-27 LAB — TSH: TSH: 12.4 u[IU]/mL — ABNORMAL HIGH (ref 0.450–4.500)

## 2020-03-27 MED ORDER — LEVOTHYROXINE SODIUM 125 MCG PO TABS: 125.0000 ug | ORAL_TABLET | Freq: Every day | ORAL | 2 refills | Status: DC

## 2020-03-27 MED ORDER — LEVOTHYROXINE SODIUM 125 MCG PO TABS: 125.0000 ug | ORAL_TABLET | Freq: Every day | ORAL | 3 refills | Status: DC

## 2020-03-27 NOTE — Progress Notes (Signed)
TSH high 12.4 you need more thyroid, called in, recheck 2 months

## 2020-04-02 ENCOUNTER — Other Ambulatory Visit: Payer: Self-pay

## 2020-04-02 DIAGNOSIS — E039 Hypothyroidism, unspecified: Secondary | ICD-10-CM

## 2020-04-02 NOTE — Progress Notes (Signed)
t

## 2020-04-09 ENCOUNTER — Encounter: Payer: Self-pay | Admitting: Physician Assistant

## 2020-04-09 ENCOUNTER — Ambulatory Visit: Payer: BC Managed Care – PPO | Admitting: Physician Assistant

## 2020-04-09 ENCOUNTER — Other Ambulatory Visit: Payer: Self-pay

## 2020-04-09 VITALS — BP 130/82 | HR 76 | Temp 97.5°F | Ht 65.0 in | Wt 269.0 lb

## 2020-04-09 DIAGNOSIS — E039 Hypothyroidism, unspecified: Secondary | ICD-10-CM

## 2020-04-09 DIAGNOSIS — F419 Anxiety disorder, unspecified: Secondary | ICD-10-CM

## 2020-04-09 DIAGNOSIS — Z1231 Encounter for screening mammogram for malignant neoplasm of breast: Secondary | ICD-10-CM

## 2020-04-09 HISTORY — DX: Anxiety disorder, unspecified: F41.9

## 2020-04-09 MED ORDER — LORAZEPAM 0.5 MG PO TABS: 0.5000 mg | ORAL_TABLET | Freq: Every day | ORAL | 1 refills | Status: DC | PRN

## 2020-04-09 NOTE — Assessment & Plan Note (Signed)
Mammogram ordered

## 2020-04-09 NOTE — Progress Notes (Signed)
Established Patient Office Visit  Subjective:  Patient ID: Kristen Vazquez, female    DOB: Dec 25, 1965  Age: 54 y.o. MRN: [id number]  CC:  Chief Complaint  Patient presents with  . Anxiety    2 week follow up    HPI [name] presents for chronic follow up of depression, GERD, asthma , hyperlipidemia and hypothyroidism  Pt currently on levothyroxine - this dose was changed recently and states she is feeling some better - she will be due to check labwork 10/13  Pt taking lexapro 20mg  qd for anxiety/depression - states the med working well for her but having breakthrough anxiety symptoms - she had seen Dr recently and he put her on Marina Goodell but she states she is not taking this because she is not really having problems with sleep - more breakthrough anxiety symptoms    Past Medical History:  Diagnosis Date  . Asthma   . COPD (chronic obstructive pulmonary disease) (HCC)   . Depression   . GERD (gastroesophageal reflux disease)   . Kidney stones     Past Surgical History:  Procedure Laterality Date  . HERNIA REPAIR  2014    Family History  Problem Relation Age of Onset  . Breast cancer Mother   . Leukemia Father   . Heart failure Brother   . Alcohol abuse Brother     Social History   Socioeconomic History  . Marital status: Widowed    Spouse name: Not on file  . Number of children: 1  . Years of education: Not on file  . Highest education level: Not on file  Occupational History  . Occupation: [employer] [place]  Tobacco Use  . Smoking status: Former Smoker    Types: Cigarettes    Quit date: 2001    Years since quitting: 20.6  . Smokeless tobacco: Never Used  Vaping Use  . Vaping Use: Never used  Substance and Sexual Activity  . Alcohol use: Yes    Alcohol/week: 2.0 standard drinks    Types: 2 Glasses of wine per week    Comment: SOCIAL  . Drug use: Never  . Sexual activity: Not Currently  Other Topics Concern  . Not on file   Social History Narrative  . Not on file   Social Determinants of Health   Financial Resource Strain:   . Difficulty of Paying Living Expenses: Not on file  Food Insecurity:   . Worried About 2002 in the Last Year: Not on file  . Ran Out of Food in the Last Year: Not on file  Transportation Needs:   . Lack of Transportation (Medical): Not on file  . Lack of Transportation (Non-Medical): Not on file  Physical Activity:   . Days of Exercise per Week: Not on file  . Minutes of Exercise per Session: Not on file  Stress:   . Feeling of Stress : Not on file  Social Connections:   . Frequency of Communication with Friends and Family: Not on file  . Frequency of Social Gatherings with Friends and Family: Not on file  . Attends Religious Services: Not on file  . Active Member of Clubs or Organizations: Not on file  . Attends Programme researcher, broadcasting/film/video Meetings: Not on file  . Marital Status: Not on file  Intimate Partner Violence:   . Fear of Current or Ex-Partner: Not on file  . Emotionally Abused: Not on file  . Physically Abused: Not on file  .  Sexually Abused: Not on file     Current Outpatient Medications:  .  albuterol (VENTOLIN HFA) 108 (90 Base) MCG/ACT inhaler, Inhale 2 puffs into the lungs every 6 (six) hours as needed for wheezing or shortness of breath., Disp: 8 g, Rfl: 5 .  escitalopram (LEXAPRO) 20 MG tablet, Take 1 tablet (20 mg total) by mouth daily., Disp: 90 tablet, Rfl: 2 .  levothyroxine (SYNTHROID) 125 MCG tablet, Take 1 tablet (125 mcg total) by mouth daily., Disp: 30 tablet, Rfl: 2 .  meloxicam (MOBIC) 7.5 MG tablet, Take 7.5 mg by mouth daily. FOR FOOT PAIN, Disp: , Rfl:  .  omeprazole (PRILOSEC) 20 MG capsule, Take 20 mg by mouth daily., Disp: , Rfl:  .  zolpidem (AMBIEN) 10 MG tablet, Take 1 tablet (10 mg total) by mouth at bedtime as needed., Disp: 15 tablet, Rfl: 2 .  LORazepam (ATIVAN) 0.5 MG tablet, Take 1 tablet (0.5 mg total) by mouth daily as  needed for anxiety., Disp: 30 tablet, Rfl: 1   Allergies  Allergen Reactions  . Codeine   . Sulfa Antibiotics Rash    ROS CONSTITUTIONAL: Negative for chills, fatigue, fever, unintentional weight gain and unintentional weight loss.   CARDIOVASCULAR: Negative for chest pain, dizziness, palpitations and pedal edema.  RESPIRATORY: Negative for recent cough and dyspnea. MSK: Negative for arthralgias and myalgias.  INTEGUMENTARY: Negative for rash.    PSYCHIATRIC: see HPI       Objective:    PHYSICAL EXAM:   VS: BP 130/82 (BP Location: Left Arm, Patient Position: Sitting)   Pulse 76   Temp (!) 97.5 F (36.4 C) (Temporal)   Ht 5\' 5"  (1.651 m)   Wt 269 lb (122 kg)   SpO2 98%   BMI 44.76 kg/m   GEN: Well nourished, well developed, in no acute distress  Cardiac: RRR; no murmurs, rubs, or gallops,no edema -  Respiratory:  normal respiratory rate and pattern with no distress - normal breath sounds with no rales, rhonchi, wheezes or rubs Skin: warm and dry, no rash  Psych: euthymic mood, appropriate affect and demeanor  BP 130/82 (BP Location: Left Arm, Patient Position: Sitting)   Pulse 76   Temp (!) 97.5 F (36.4 C) (Temporal)   Ht 5\' 5"  (1.651 m)   Wt 269 lb (122 kg)   SpO2 98%   BMI 44.76 kg/m  Wt Readings from Last 3 Encounters:  04/09/20 269 lb (122 kg)  03/26/20 270 lb (122.5 kg)  01/02/20 267 lb (121.1 kg)     Health Maintenance Due  Topic Date Due  . Hepatitis C Screening  Never done  . HIV Screening  Never done  . TETANUS/TDAP  Never done  . PAP SMEAR-Modifier  Never done  . INFLUENZA VACCINE  03/17/2020    There are no preventive care reminders to display for this patient.  Lab Results  Component Value Date   TSH 12.400 (H) 03/26/2020   Lab Results  Component Value Date   WBC 8.8 01/02/2020   HGB 13.2 01/02/2020   HCT 42.7 01/02/2020   MCV 85 01/02/2020   PLT 273 01/02/2020   Lab Results  Component Value Date   NA 135 01/02/2020   K  4.2 01/02/2020   CO2 24 01/02/2020   GLUCOSE 112 (H) 01/02/2020   BUN 9 01/02/2020   CREATININE 0.58 01/02/2020   BILITOT 0.2 01/02/2020   ALKPHOS 133 (H) 01/02/2020   AST 41 (H) 01/02/2020   ALT 48 (H) 01/02/2020  PROT 6.9 01/02/2020   ALBUMIN 4.0 01/02/2020   CALCIUM 8.9 01/02/2020   Lab Results  Component Value Date   CHOL 207 (H) 01/02/2020   Lab Results  Component Value Date   HDL 49 01/02/2020   Lab Results  Component Value Date   LDLCALC 138 (H) 01/02/2020   Lab Results  Component Value Date   TRIG 114 01/02/2020   Lab Results  Component Value Date   CHOLHDL 4.2 01/02/2020   Lab Results  Component Value Date   HGBA1C 6.1 (H) 01/02/2020      Assessment & Plan:   Problem List Items Addressed This Visit      Endocrine   Acquired hypothyroidism - Primary    Continue current med and get labwork as scheduled        Other   Encounter for screening mammogram for breast cancer    Mammogram ordered      Relevant Orders   MM Digital Screening   Anxiety    Trial ativan as directed      Relevant Medications   LORazepam (ATIVAN) 0.5 MG tablet      Meds ordered this encounter  Medications  . LORazepam (ATIVAN) 0.5 MG tablet    Sig: Take 1 tablet (0.5 mg total) by mouth daily as needed for anxiety.    Dispense:  30 tablet    Refill:  1    Order Specific Question:   Supervising Provider    AnswerCorey Harold    Follow-up: Return in about 3 months (around 07/10/2020) for chronic fasting follow up.    SARA R Pamela Intrieri, PA-C

## 2020-04-09 NOTE — Assessment & Plan Note (Signed)
Trial ativan as directed

## 2020-04-09 NOTE — Assessment & Plan Note (Signed)
Continue current med and get labwork as scheduled

## 2020-05-29 ENCOUNTER — Other Ambulatory Visit: Payer: Self-pay

## 2020-05-29 ENCOUNTER — Other Ambulatory Visit: Payer: BC Managed Care – PPO

## 2020-05-29 DIAGNOSIS — E039 Hypothyroidism, unspecified: Secondary | ICD-10-CM

## 2020-05-30 ENCOUNTER — Other Ambulatory Visit: Payer: Self-pay | Admitting: Legal Medicine

## 2020-05-30 DIAGNOSIS — E039 Hypothyroidism, unspecified: Secondary | ICD-10-CM

## 2020-05-30 LAB — TSH: TSH: 0.355 u[IU]/mL — ABNORMAL LOW (ref 0.450–4.500)

## 2020-05-30 MED ORDER — LEVOTHYROXINE SODIUM 112 MCG PO TABS: 112.0000 ug | ORAL_TABLET | Freq: Every day | ORAL | 3 refills | Status: DC

## 2020-05-30 NOTE — Progress Notes (Signed)
TSH is now too low meaning too much thyroid, I suggest compromising and going on 112 MCG a day and recheck in 8 weeks TSH lp

## 2020-08-06 ENCOUNTER — Ambulatory Visit: Payer: BC Managed Care – PPO | Admitting: Physician Assistant

## 2020-09-23 ENCOUNTER — Telehealth: Payer: Self-pay

## 2020-09-23 NOTE — Telephone Encounter (Signed)
Pt called fell outside in her yard, sts her shoulder is hurting and her fingers are a little numb, she can move it up and down but not front to back. Wanted to know if she could take Mobic.  Per Carollee Herter, I Told pt it was ok for her to take Mobic and alternate with tylenol but do not use ibuprofen. Also rest, ice, compress, and elevate the shoulder.   Pt has appt 09/24/2020 1100.

## 2020-09-24 ENCOUNTER — Other Ambulatory Visit: Payer: Self-pay

## 2020-09-24 ENCOUNTER — Encounter: Payer: Self-pay | Admitting: Nurse Practitioner

## 2020-09-24 ENCOUNTER — Ambulatory Visit: Payer: BC Managed Care – PPO | Admitting: Nurse Practitioner

## 2020-09-24 VITALS — BP 142/78 | HR 78 | Temp 97.2°F | Ht 65.0 in | Wt 266.0 lb

## 2020-09-24 DIAGNOSIS — R7989 Other specified abnormal findings of blood chemistry: Secondary | ICD-10-CM | POA: Diagnosis not present

## 2020-09-24 DIAGNOSIS — G8911 Acute pain due to trauma: Secondary | ICD-10-CM

## 2020-09-24 DIAGNOSIS — W19XXXA Unspecified fall, initial encounter: Secondary | ICD-10-CM

## 2020-09-24 DIAGNOSIS — M25511 Pain in right shoulder: Secondary | ICD-10-CM

## 2020-09-24 DIAGNOSIS — E039 Hypothyroidism, unspecified: Secondary | ICD-10-CM | POA: Diagnosis not present

## 2020-09-24 DIAGNOSIS — Y92009 Unspecified place in unspecified non-institutional (private) residence as the place of occurrence of the external cause: Secondary | ICD-10-CM

## 2020-09-24 MED ORDER — CYCLOBENZAPRINE HCL 5 MG PO TABS: 5.0000 mg | ORAL_TABLET | Freq: Three times a day (TID) | ORAL | 0 refills | Status: DC | PRN

## 2020-09-24 MED ORDER — KETOROLAC TROMETHAMINE 60 MG/2ML IM SOLN
60.0000 mg | Freq: Once | INTRAMUSCULAR | Status: AC
  Administered 2020-09-24: 60 mg via INTRAMUSCULAR

## 2020-09-24 NOTE — Patient Instructions (Addendum)
Obtain right shoulder/arm x-ray at [facility] Take Tylenol as needed for pain May take Mobic 7.5 mg daily May take Flexeril 5 mg for muscle spasms  Rest, ice, elevate right arm Perform right shoulder exercises No work for 2 weeks  RICE Therapy for Routine Care of Injuries Many injuries can be cared for with rest, ice, compression, and elevation (RICE therapy). This includes:  Resting the injured body part.  Putting ice on the injury.  Putting pressure (compression) on the injury.  Raising the injured part (elevation). Using RICE therapy can help to lessen pain and swelling. Supplies needed:  Ice.  Plastic bag.  Towel.  Elastic bandage.  Pillow or pillows to raise your injured body part. How to care for your injury with RICE therapy Rest Try to rest the injured part of your body. You can go back to your normal activities when your doctor says it is okay to do them and when you can do them without pain. If you rest the injury too much, it may not heal as well. Some injuries heal better with early movement instead of resting for too long. Ask your doctor if you should do exercises to help your injury get better. Ice  If told, put ice on the injured area. To do this: ? Put ice in a plastic bag. ? Place a towel between your skin and the bag. ? Leave the ice on for 20 minutes, 2-3 times a day. ? Take off the ice if your skin turns bright red. This is very important. If you cannot feel pain, heat, or cold, you have a greater risk of damage to the area.  Do not put ice on your bare skin. Use ice for as many days as your doctor tells you to use it.   Compression Put pressure on the injured area. This can be done with an elastic bandage. If this type of bandage has been put on your injury:  Follow instructions on the package the bandage came in about how to use it.  Do not wrap the bandage too tightly. ? Wrap the bandage more loosely if part of your body beyond the  bandage is blue, swollen, cold, painful, or loses feeling.  Take off the bandage and put it on again every 3-4 hours or as told by your doctor.  See your doctor if the bandage seems to make your problems worse.   Elevation Raise the injured area above the level of your heart while you are sitting or lying down. Follow these instructions at home:  If your symptoms get worse or last a long time, make a follow-up appointment with your doctor. You may need to have imaging tests, such as X-rays or an MRI.  If you have imaging tests, ask how to get your results when they are ready.  Return to your normal activities when your doctor says that it is safe.  Keep all follow-up visits. Contact a doctor if:  You keep having pain and swelling.  Your symptoms get worse. Get help right away if:  You have sudden, very bad pain at your injury or lower than your injury.  You have redness or more swelling around your injury.  You have tingling or numbness at your injury or lower than your injury, and it does not go away when you take off the bandage. Summary  Many injuries can be cared for using rest, ice, compression, and elevation (RICE therapy).  You can go back to your normal activities  when your doctor says it is okay and when you can do them without pain.  Put ice on the injured area as told by your doctor.  Get help if your symptoms get worse or if you keep having pain and swelling. This information is not intended to replace advice given to you by your health care provider. Make sure you discuss any questions you have with your health care provider. Document Revised: 05/23/2020 Document Reviewed: 05/23/2020 Elsevier Patient Education  2021 [employer] Shoulder Exercises Ask your health care provider which exercises are safe for you. Do exercises exactly as told by your health care provider and adjust them as directed. It is normal to feel mild stretching, pulling, tightness, or  discomfort as you do these exercises. Stop right away if you feel sudden pain or your pain gets worse. Do not begin these exercises until told by your health care provider. Stretching exercises External rotation and abduction This exercise is sometimes called corner stretch. This exercise rotates your arm outward (external rotation) and moves your arm out from your body (abduction). 1. Stand in a doorway with one of your feet slightly in front of the other. This is called a staggered stance. If you cannot reach your forearms to the door frame, stand facing a corner of a room. 2. Choose one of the following positions as told by your health care provider: ? Place your hands and forearms on the door frame above your head. ? Place your hands and forearms on the door frame at the height of your head. ? Place your hands on the door frame at the height of your elbows. 3. Slowly move your weight onto your front foot until you feel a stretch across your chest and in the front of your shoulders. Keep your head and chest upright and keep your abdominal muscles tight. 4. Hold for __________ seconds. 5. To release the stretch, shift your weight to your back foot. Repeat __________ times. Complete this exercise __________ times a day.   Extension, standing 1. Stand and hold a broomstick, a cane, or a similar object behind your back. ? Your hands should be a little wider than shoulder width apart. ? Your palms should face away from your back. 2. Keeping your elbows straight and your shoulder muscles relaxed, move the stick away from your body until you feel a stretch in your shoulders (extension). ? Avoid shrugging your shoulders while you move the stick. Keep your shoulder blades tucked down toward the middle of your back. 3. Hold for __________ seconds. 4. Slowly return to the starting position. Repeat __________ times. Complete this exercise __________ times a day. Range-of-motion  exercises Pendulum 1. Stand near a wall or a surface that you can hold onto for balance. 2. Bend at the waist and let your left / right arm hang straight down. Use your other arm to support you. Keep your back straight and do not lock your knees. 3. Relax your left / right arm and shoulder muscles, and move your hips and your trunk so your left / right arm swings freely. Your arm should swing because of the motion of your body, not because you are using your arm or shoulder muscles. 4. Keep moving your hips and trunk so your arm swings in the following directions, as told by your health care provider: ? Side to side. ? Forward and backward. ? In clockwise and counterclockwise circles. 5. Continue each motion for __________ seconds, or for as long as told  by your health care provider. 6. Slowly return to the starting position. Repeat __________ times. Complete this exercise __________ times a day.   Shoulder flexion, standing 1. Stand and hold a broomstick, a cane, or a similar object. Place your hands a little more than shoulder width apart on the object. Your left / right hand should be palm up, and your other hand should be palm down. 2. Keep your elbow straight and your shoulder muscles relaxed. Push the stick up with your healthy arm to raise your left / right arm in front of your body, and then over your head until you feel a stretch in your shoulder (flexion). ? Avoid shrugging your shoulder while you raise your arm. Keep your shoulder blade tucked down toward the middle of your back. 3. Hold for __________ seconds. 4. Slowly return to the starting position. Repeat __________ times. Complete this exercise __________ times a day.   Shoulder abduction, standing 1. Stand and hold a broomstick, a cane, or a similar object. Place your hands a little more than shoulder width apart on the object. Your left / right hand should be palm up, and your other hand should be palm down. 2. Keep your elbow  straight and your shoulder muscles relaxed. Push the object across your body toward your left / right side. Raise your left / right arm to the side of your body (abduction) until you feel a stretch in your shoulder. ? Do not raise your arm above shoulder height unless your health care provider tells you to do that. ? If directed, raise your arm over your head. ? Avoid shrugging your shoulder while you raise your arm. Keep your shoulder blade tucked down toward the middle of your back. 3. Hold for __________ seconds. 4. Slowly return to the starting position. Repeat __________ times. Complete this exercise __________ times a day. Internal rotation 1. Place your left / right hand behind your back, palm up. 2. Use your other hand to dangle an exercise band, a towel, or a similar object over your shoulder. Grasp the band with your left / right hand so you are holding on to both ends. 3. Gently pull up on the band until you feel a stretch in the front of your left / right shoulder. The movement of your arm toward the center of your body is called internal rotation. ? Avoid shrugging your shoulder while you raise your arm. Keep your shoulder blade tucked down toward the middle of your back. 4. Hold for __________ seconds. 5. Release the stretch by letting go of the band and lowering your hands. Repeat __________ times. Complete this exercise __________ times a day.   Strengthening exercises External rotation 1. Sit in a stable chair without armrests. 2. Secure an exercise band to a stable object at elbow height on your left / right side. 3. Place a soft object, such as a folded towel or a small pillow, between your left / right upper arm and your body to move your elbow about 4 inches (10 cm) away from your side. 4. Hold the end of the exercise band so it is tight and there is no slack. 5. Keeping your elbow pressed against the soft object, slowly move your forearm out, away from your abdomen  (external rotation). Keep your body steady so only your forearm moves. 6. Hold for __________ seconds. 7. Slowly return to the starting position. Repeat __________ times. Complete this exercise __________ times a day.   Shoulder abduction 1.  Sit in a stable chair without armrests, or stand up. 2. Hold a __________ weight in your left / right hand, or hold an exercise band with both hands. 3. Start with your arms straight down and your left / right palm facing in, toward your body. 4. Slowly lift your left / right hand out to your side (abduction). Do not lift your hand above shoulder height unless your health care provider tells you that this is safe. ? Keep your arms straight. ? Avoid shrugging your shoulder while you do this movement. Keep your shoulder blade tucked down toward the middle of your back. 5. Hold for __________ seconds. 6. Slowly lower your arm, and return to the starting position. Repeat __________ times. Complete this exercise __________ times a day.   Shoulder extension 1. Sit in a stable chair without armrests, or stand up. 2. Secure an exercise band to a stable object in front of you so it is at shoulder height. 3. Hold one end of the exercise band in each hand. Your palms should face each other. 4. Straighten your elbows and lift your hands up to shoulder height. 5. Step back, away from the secured end of the exercise band, until the band is tight and there is no slack. 6. Squeeze your shoulder blades together as you pull your hands down to the sides of your thighs (extension). Stop when your hands are straight down by your sides. Do not let your hands go behind your body. 7. Hold for __________ seconds. 8. Slowly return to the starting position. Repeat __________ times. Complete this exercise __________ times a day. Shoulder row 1. Sit in a stable chair without armrests, or stand up. 2. Secure an exercise band to a stable object in front of you so it is at waist  height. 3. Hold one end of the exercise band in each hand. Position your palms so that your thumbs are facing the ceiling (neutral position). 4. Bend each of your elbows to a 90-degree angle (right angle) and keep your upper arms at your sides. 5. Step back until the band is tight and there is no slack. 6. Slowly pull your elbows back behind you. 7. Hold for __________ seconds. 8. Slowly return to the starting position. Repeat __________ times. Complete this exercise __________ times a day. Shoulder press-ups 1. Sit in a stable chair that has armrests. Sit upright, with your feet flat on the floor. 2. Put your hands on the armrests so your elbows are bent and your fingers are pointing forward. Your hands should be about even with the sides of your body. 3. Push down on the armrests and use your arms to lift yourself off the chair. Straighten your elbows and lift yourself up as much as you comfortably can. ? Move your shoulder blades down, and avoid letting your shoulders move up toward your ears. ? Keep your feet on the ground. As you get stronger, your feet should support less of your body weight as you lift yourself up. 4. Hold for __________ seconds. 5. Slowly lower yourself back into the chair. Repeat __________ times. Complete this exercise __________ times a day.   Wall push-ups 1. Stand so you are facing a stable wall. Your feet should be about one arm-length away from the wall. 2. Lean forward and place your palms on the wall at shoulder height. 3. Keep your feet flat on the floor as you bend your elbows and lean forward toward the wall. 4. Hold for __________  seconds. 5. Straighten your elbows to push yourself back to the starting position. Repeat __________ times. Complete this exercise __________ times a day.   This information is not intended to replace advice given to you by your health care provider. Make sure you discuss any questions you have with your health care  provider. Document Revised: 11/25/2018 Document Reviewed: 09/02/2018 Elsevier Patient Education  2021 [employer] Inc. Shoulder Pain Many things can cause shoulder pain, including:  An injury.  Moving the shoulder in the same way again and again (overuse).  Joint pain (arthritis). Pain can come from:  Swelling and irritation (inflammation) of any part of the shoulder.  An injury to the shoulder joint.  An injury to: ? Tissues that connect muscle to bone (tendons). ? Tissues that connect bones to each other (ligaments). ? Bones. Follow these instructions at home: Watch for changes in your symptoms. Let your doctor know about them. Follow these instructions to help with your pain. If you have a sling:  Wear the sling as told by your doctor. Remove it only as told by your doctor.  Loosen the sling if your fingers: ? Tingle. ? Become numb. ? Turn cold and blue.  Keep the sling clean.  If the sling is not waterproof: ? Do not let it get wet. ? Take the sling off when you shower or bathe. Managing pain, stiffness, and swelling  If told, put ice on the painful area: ? Put ice in a plastic bag. ? Place a towel between your skin and the bag. ? Leave the ice on for 20 minutes, 2-3 times a day. Stop putting ice on if it does not help with the pain.  Squeeze a soft ball or a foam pad as much as possible. This prevents swelling in the shoulder. It also helps to strengthen the arm.   General instructions  Take over-the-counter and prescription medicines only as told by your doctor.  Keep all follow-up visits as told by your doctor. This is important. Contact a doctor if:  Your pain gets worse.  Medicine does not help your pain.  You have new pain in your arm, hand, or fingers. Get help right away if:  Your arm, hand, or fingers: ? Tingle. ? Are numb. ? Are swollen. ? Are painful. ? Turn white or blue. Summary  Shoulder pain can be caused by many things. These  include injury, moving the shoulder in the same away again and again, and joint pain.  Watch for changes in your symptoms. Let your doctor know about them.  This condition may be treated with a sling, ice, and pain medicine.  Contact your doctor if the pain gets worse or you have new pain. Get help right away if your arm, hand, or fingers tingle or get numb, swollen, or painful.  Keep all follow-up visits as told by your doctor. This is important. This information is not intended to replace advice given to you by your health care provider. Make sure you discuss any questions you have with your health care provider. Document Revised: 02/15/2018 Document Reviewed: 02/15/2018 Elsevier Patient Education  2021 [employer]. Fall Prevention in the Home, Adult Falls can cause injuries and can happen to people of all ages. There are many things you can do to make your home safe and to help prevent falls. Ask for help when making these changes. What actions can I take to prevent falls? General Instructions  Use good lighting in all rooms. Replace any light bulbs  that burn out.  Turn on the lights in dark areas. Use night-lights.  Keep items that you use often in easy-to-reach places. Lower the shelves around your home if needed.  Set up your furniture so you have a clear path. Avoid moving your furniture around.  Do not have throw rugs or other things on the floor that can make you trip.  Avoid walking on wet floors.  If any of your floors are uneven, fix them.  Add color or contrast paint or tape to clearly mark and help you see: ? Grab bars or handrails. ? First and last steps of staircases. ? Where the edge of each step is.  If you use a stepladder: ? Make sure that it is fully opened. Do not climb a closed stepladder. ? Make sure the sides of the stepladder are locked in place. ? Ask someone to hold the stepladder while you use it.  Know where your pets are when moving through  your home. What can I do in the bathroom?  Keep the floor dry. Clean up any water on the floor right away.  Remove soap buildup in the tub or shower.  Use nonskid mats or decals on the floor of the tub or shower.  Attach bath mats securely with double-sided, nonslip rug tape.  If you need to sit down in the shower, use a plastic, nonslip stool.  Install grab bars by the toilet and in the tub and shower. Do not use towel bars as grab bars.      What can I do in the bedroom?  Make sure that you have a light by your bed that is easy to reach.  Do not use any sheets or blankets for your bed that hang to the floor.  Have a firm chair with side arms that you can use for support when you get dressed. What can I do in the kitchen?  Clean up any spills right away.  If you need to reach something above you, use a step stool with a grab bar.  Keep electrical cords out of the way.  Do not use floor polish or wax that makes floors slippery. What can I do with my stairs?  Do not leave any items on the stairs.  Make sure that you have a light switch at the top and the bottom of the stairs.  Make sure that there are handrails on both sides of the stairs. Fix handrails that are broken or loose.  Install nonslip stair treads on all your stairs.  Avoid having throw rugs at the top or bottom of the stairs.  Choose a carpet that does not hide the edge of the steps on the stairs.  Check carpeting to make sure that it is firmly attached to the stairs. Fix carpet that is loose or worn. What can I do on the outside of my home?  Use bright outdoor lighting.  Fix the edges of walkways and driveways and fix any cracks.  Remove anything that might make you trip as you walk through a door, such as a raised step or threshold.  Trim any bushes or trees on paths to your home.  Check to see if handrails are loose or broken and that both sides of all steps have handrails.  Install guardrails  along the edges of any raised decks and porches.  Clear paths of anything that can make you trip, such as tools or rocks.  Have leaves, snow, or ice cleared regularly.  Use sand or salt on paths during winter.  Clean up any spills in your garage right away. This includes grease or oil spills. What other actions can I take?  Wear shoes that: ? Have a low heel. Do not wear high heels. ? Have rubber bottoms. ? Feel good on your feet and fit well. ? Are closed at the toe. Do not wear open-toe sandals.  Use tools that help you move around if needed. These include: ? Canes. ? Walkers. ? Scooters. ? Crutches.  Review your medicines with your doctor. Some medicines can make you feel dizzy. This can increase your chance of falling. Ask your doctor what else you can do to help prevent falls. Where to find more information  Centers for Disease Control and Prevention, [employer]: FootballExhibition.com.br  [employer] on Aging: https://walker.com/ Contact a doctor if:  You are afraid of falling at home.  You feel weak, drowsy, or dizzy at home.  You fall at home. Summary  There are many simple things that you can do to make your home safe and to help prevent falls.  Ways to make your home safe include removing things that can make you trip and installing grab bars in the bathroom.  Ask for help when making these changes in your home. This information is not intended to replace advice given to you by your health care provider. Make sure you discuss any questions you have with your health care provider. Document Revised: 03/06/2020 Document Reviewed: 03/06/2020 Elsevier Patient Education  2021 Elsevier Inc. Cyclobenzaprine tablets What is this medicine? CYCLOBENZAPRINE (sye kloe BEN za preen) is a muscle relaxer. It is used to treat muscle pain, spasms, and stiffness. This medicine may be used for other purposes; ask your health care provider or pharmacist if you have questions. COMMON BRAND  NAME(S): Fexmid, Flexeril What should I tell my health care provider before I take this medicine? They need to know if you have any of these conditions:  heart disease, irregular heartbeat, or previous heart attack  liver disease  thyroid problem  an unusual or allergic reaction to cyclobenzaprine, tricyclic antidepressants, lactose, other medicines, foods, dyes, or preservatives  pregnant or trying to get pregnant  breast-feeding How should I use this medicine? Take this medicine by mouth with a glass of water. Follow the directions on the prescription label. If this medicine upsets your stomach, take it with food or milk. Take your medicine at regular intervals. Do not take it more often than directed. Talk to your pediatrician regarding the use of this medicine in children. Special care may be needed. Overdosage: If you think you have taken too much of this medicine contact a poison control center or emergency room at once. NOTE: This medicine is only for you. Do not share this medicine with others. What if I miss a dose? If you miss a dose, take it as soon as you can. If it is almost time for your next dose, take only that dose. Do not take double or extra doses. What may interact with this medicine? Do not take this medicine with any of the following medications:  MAOIs like Carbex, Eldepryl, Marplan, Nardil, and Parnate  narcotic medicines for cough  safinamide This medicine may also interact with the following medications:  alcohol  bupropion  antihistamines for allergy, cough and cold  certain medicines for anxiety or sleep  certain medicines for bladder problems like oxybutynin, tolterodine  certain medicines for depression like amitriptyline, fluoxetine, sertraline  certain medicines  for Parkinson's disease like benztropine, trihexyphenidyl  certain medicines for seizures like phenobarbital, primidone  certain medicines for stomach problems like dicyclomine,  hyoscyamine  certain medicines for travel sickness like scopolamine  general anesthetics like halothane, isoflurane, methoxyflurane, propofol  ipratropium  local anesthetics like lidocaine, pramoxine, tetracaine  medicines that relax muscles for surgery  narcotic medicines for pain  phenothiazines like chlorpromazine, mesoridazine, prochlorperazine, thioridazine  verapamil This list may not describe all possible interactions. Give your health care provider a list of all the medicines, herbs, non-prescription drugs, or dietary supplements you use. Also tell them if you smoke, drink alcohol, or use illegal drugs. Some items may interact with your medicine. What should I watch for while using this medicine? Tell your doctor or health care professional if your symptoms do not start to get better or if they get worse. You may get drowsy or dizzy. Do not drive, use machinery, or do anything that needs mental alertness until you know how this medicine affects you. Do not stand or sit up quickly, especially if you are an older patient. This reduces the risk of dizzy or fainting spells. Alcohol may interfere with the effect of this medicine. Avoid alcoholic drinks. If you are taking another medicine that also causes drowsiness, you may have more side effects. Give your health care provider a list of all medicines you use. Your doctor will tell you how much medicine to take. Do not take more medicine than directed. Call emergency for help if you have problems breathing or unusual sleepiness. Your mouth may get dry. Chewing sugarless gum or sucking hard candy, and drinking plenty of water may help. Contact your doctor if the problem does not go away or is severe. What side effects may I notice from receiving this medicine? Side effects that you should report to your doctor or health care professional as soon as possible:  allergic reactions like skin rash, itching or hives, swelling of the face, lips,  or tongue  breathing problems  chest pain  fast, irregular heartbeat  hallucinations  seizures  unusually weak or tired Side effects that usually do not require medical attention (report to your doctor or health care professional if they continue or are bothersome):  headache  nausea, vomiting This list may not describe all possible side effects. Call your doctor for medical advice about side effects. You may report side effects to FDA at 1-800-FDA-1088. Where should I keep my medicine? Keep out of the reach of children. Store at room temperature between 15 and 30 degrees C (59 and 86 degrees F). Keep container tightly closed. Throw away any unused medicine after the expiration date. NOTE: This sheet is a summary. It may not cover all possible information. If you have questions about this medicine, talk to your doctor, pharmacist, or health care provider.  2021 Elsevier/Gold Standard (2018-07-06 12:49:26)

## 2020-09-24 NOTE — Progress Notes (Signed)
Acute Office Visit  Subjective:    Patient ID: Kristen Vazquez, female    DOB: April 22, 1966, 55 y.o.   MRN: [id number]  Chief Complaint  Patient presents with  . Right shoulder pain s/p fall        HPI Patient is in today for right shoulder pain after sustaining a fall yesterday.She is right-hand dominant. Capri states she slipped in mud with her right foot and landed on her right elbow. She immediately felt pain radiated superiorly to right shoulder. Rates pain as 10/10, throbbing in nature, and has experienced decreased ROM.She tells me she had numbness and tingling to right hand and forearm immediately after fall but it has resolved.  Treatment has included Tylenol 650 mg and Mobic 7.5 mg. She has applied ice and elevated right arm on pillows. She denies previous injury, trauma, or surgery to right shoulder. She has requested a refill of Levothyroxine 112 mcg. She missed her last appt in Dec due to a family emergency. Last labs on 05/29/20 reveal TSH 0.3555.   Past Medical History:  Diagnosis Date  . Asthma   . COPD (chronic obstructive pulmonary disease) (HCC)   . Depression   . GERD (gastroesophageal reflux disease)   . Kidney stones     Past Surgical History:  Procedure Laterality Date  . HERNIA REPAIR  2014    Family History  Problem Relation Age of Onset  . Breast cancer Mother   . Leukemia Father   . Heart failure Brother   . Alcohol abuse Brother     Social History   Socioeconomic History  . Marital status: Widowed    Spouse name: Not on file  . Number of children: 1  . Years of education: Not on file  . Highest education level: Not on file  Occupational History  . Occupation: [employer]  Tobacco Use  . Smoking status: Former Smoker    Types: Cigarettes    Quit date: 2001    Years since quitting: 21.1  . Smokeless tobacco: Never Used  Vaping Use  . Vaping Use: Never used  Substance and Sexual Activity  . Alcohol use: Yes    Alcohol/week:  2.0 standard drinks    Types: 2 Glasses of wine per week    Comment: SOCIAL  . Drug use: Never  . Sexual activity: Not Currently  Other Topics Concern  . Not on file  Social History Narrative  . Not on file   Social Determinants of Health   Financial Resource Strain: Not on file  Food Insecurity: Not on file  Transportation Needs: Not on file  Physical Activity: Not on file  Stress: Not on file  Social Connections: Not on file  Intimate Partner Violence: Not on file    Outpatient Medications Prior to Visit  Medication Sig Dispense Refill  . albuterol (VENTOLIN HFA) 108 (90 Base) MCG/ACT inhaler Inhale 2 puffs into the lungs every 6 (six) hours as needed for wheezing or shortness of breath. 8 g 5  . escitalopram (LEXAPRO) 20 MG tablet Take 1 tablet (20 mg total) by mouth daily. 90 tablet 2  . levothyroxine (SYNTHROID) 112 MCG tablet Take 1 tablet (112 mcg total) by mouth daily. 30 tablet 3  . LORazepam (ATIVAN) 0.5 MG tablet Take 1 tablet (0.5 mg total) by mouth daily as needed for anxiety. 30 tablet 1  . meloxicam (MOBIC) 7.5 MG tablet Take 7.5 mg by mouth daily. FOR FOOT PAIN    . omeprazole (  PRILOSEC) 20 MG capsule Take 20 mg by mouth daily.    Marland Kitchen zolpidem (AMBIEN) 10 MG tablet Take 1 tablet (10 mg total) by mouth at bedtime as needed. 15 tablet 2   No facility-administered medications prior to visit.    Allergies  Allergen Reactions  . Codeine   . Sulfa Antibiotics Rash    Review of Systems  Constitutional: Negative for fatigue.  Respiratory: Negative for shortness of breath.   Cardiovascular: Negative for chest pain and palpitations.  Gastrointestinal: Negative for abdominal pain, constipation, diarrhea, nausea and vomiting.  Endocrine: Negative for cold intolerance, heat intolerance, polydipsia, polyphagia and polyuria.  Musculoskeletal: Positive for arthralgias (right shoulder and humerus), joint swelling (right shoulder) and myalgias (right arm/shoulder).        Muscle spasms to posterior right shoulder/scapula area  Neurological: Positive for numbness (right hand, forearm post-fall, resolved). Negative for dizziness and weakness.       Objective:    Physical Exam Vitals reviewed.  Constitutional:      Appearance: She is obese.  HENT:     Head: Normocephalic.  Cardiovascular:     Rate and Rhythm: Normal rate and regular rhythm.     Pulses: Normal pulses.     Heart sounds: Normal heart sounds.  Pulmonary:     Breath sounds: Normal breath sounds.  Abdominal:     General: Bowel sounds are normal.     Palpations: Abdomen is soft.  Musculoskeletal:        General: Swelling and tenderness present.     Right shoulder: Swelling and tenderness present. Decreased range of motion. Decreased strength. Normal pulse.     Left shoulder: Normal. No swelling or tenderness. Normal range of motion. Normal strength. Normal pulse.  Skin:    General: Skin is warm and dry.     Capillary Refill: Capillary refill takes less than 2 seconds.  Neurological:     General: No focal deficit present.     Mental Status: She is alert and oriented to person, place, and time.  Psychiatric:        Mood and Affect: Mood normal.        Behavior: Behavior normal.     BP (!) 142/78 (BP Location: Left Arm, Patient Position: Sitting)   Pulse 78   Temp (!) 97.2 F (36.2 C) (Temporal)   Ht 5\' 5"  (1.651 m)   Wt 266 lb (120.7 kg)   SpO2 99%   BMI 44.26 kg/m  Wt Readings from Last 3 Encounters:  09/24/20 266 lb (120.7 kg)  04/09/20 269 lb (122 kg)  03/26/20 270 lb (122.5 kg)    Health Maintenance Due  Topic Date Due  . Hepatitis C Screening  Never done  . HIV Screening  Never done  . TETANUS/TDAP  Never done  . PAP SMEAR-Modifier  Never done       Lab Results  Component Value Date   TSH 0.355 (L) 05/29/2020   Lab Results  Component Value Date   WBC 8.8 01/02/2020   HGB 13.2 01/02/2020   HCT 42.7 01/02/2020   MCV 85 01/02/2020   PLT 273 01/02/2020    Lab Results  Component Value Date   NA 135 01/02/2020   K 4.2 01/02/2020   CO2 24 01/02/2020   GLUCOSE 112 (H) 01/02/2020   BUN 9 01/02/2020   CREATININE 0.58 01/02/2020   BILITOT 0.2 01/02/2020   ALKPHOS 133 (H) 01/02/2020   AST 41 (H) 01/02/2020   ALT 48 (H) 01/02/2020  PROT 6.9 01/02/2020   ALBUMIN 4.0 01/02/2020   CALCIUM 8.9 01/02/2020   Lab Results  Component Value Date   CHOL 207 (H) 01/02/2020   Lab Results  Component Value Date   HDL 49 01/02/2020   Lab Results  Component Value Date   LDLCALC 138 (H) 01/02/2020   Lab Results  Component Value Date   TRIG 114 01/02/2020   Lab Results  Component Value Date   CHOLHDL 4.2 01/02/2020   Lab Results  Component Value Date   HGBA1C 6.1 (H) 01/02/2020       Assessment & Plan:   1. Acute pain of right shoulder due to trauma - ketorolac (TORADOL) injection 60 mg - cyclobenzaprine (FLEXERIL) 5 MG tablet; Take 1 tablet (5 mg total) by mouth 3 (three) times daily as needed for muscle spasms.  Dispense: 30 tablet; Refill: 0 - DG Shoulder Right  2. Fall at home, initial encounter  3. Acquired hypothyroidism - TSH  4. Low serum thyroid stimulating hormone (TSH) - TSH   Obtain right shoulder/arm x-ray at [facility] Take Tylenol as needed for pain May take Mobic 7.5 mg daily May take Flexeril 5 mg for muscle spasms  Rest, ice, elevate right arm Perform right shoulder exercises No work for 2 weeks    Problem List Items Addressed This Visit      Endocrine   Acquired hypothyroidism   Relevant Orders   TSH    Other Visit Diagnoses    Acute pain of right shoulder due to trauma    -  Primary   Fall at home, initial encounter           Follow-up: 2 weeks     Janie Morning, NP

## 2020-09-24 NOTE — Progress Notes (Deleted)
Acute Office Visit  Subjective:    Patient ID: Kristen Vazquez, female    DOB: 03-10-1966, 55 y.o.   MRN: [id number]  Chief Complaint  Patient presents with  . Right shoulder pain    Fell on elbow and jarred her shoulder    HPI Patient is in today for ***  Past Medical History:  Diagnosis Date  . Asthma   . COPD (chronic obstructive pulmonary disease) (HCC)   . Depression   . GERD (gastroesophageal reflux disease)   . Kidney stones     Past Surgical History:  Procedure Laterality Date  . HERNIA REPAIR  2014    Family History  Problem Relation Age of Onset  . Breast cancer Mother   . Leukemia Father   . Heart failure Brother   . Alcohol abuse Brother     Social History   Socioeconomic History  . Marital status: Widowed    Spouse name: Not on file  . Number of children: 1  . Years of education: Not on file  . Highest education level: Not on file  Occupational History  . Occupation: [employer]  Tobacco Use  . Smoking status: Former Smoker    Types: Cigarettes    Quit date: 2001    Years since quitting: 21.1  . Smokeless tobacco: Never Used  Vaping Use  . Vaping Use: Never used  Substance and Sexual Activity  . Alcohol use: Yes    Alcohol/week: 2.0 standard drinks    Types: 2 Glasses of wine per week    Comment: SOCIAL  . Drug use: Never  . Sexual activity: Not Currently  Other Topics Concern  . Not on file  Social History Narrative  . Not on file   Social Determinants of Health   Financial Resource Strain: Not on file  Food Insecurity: Not on file  Transportation Needs: Not on file  Physical Activity: Not on file  Stress: Not on file  Social Connections: Not on file  Intimate Partner Violence: Not on file    Outpatient Medications Prior to Visit  Medication Sig Dispense Refill  . albuterol (VENTOLIN HFA) 108 (90 Base) MCG/ACT inhaler Inhale 2 puffs into the lungs every 6 (six) hours as needed for wheezing or shortness of breath.  8 g 5  . escitalopram (LEXAPRO) 20 MG tablet Take 1 tablet (20 mg total) by mouth daily. 90 tablet 2  . levothyroxine (SYNTHROID) 112 MCG tablet Take 1 tablet (112 mcg total) by mouth daily. 30 tablet 3  . LORazepam (ATIVAN) 0.5 MG tablet Take 1 tablet (0.5 mg total) by mouth daily as needed for anxiety. 30 tablet 1  . meloxicam (MOBIC) 7.5 MG tablet Take 7.5 mg by mouth daily. FOR FOOT PAIN    . omeprazole (PRILOSEC) 20 MG capsule Take 20 mg by mouth daily.    Marland Kitchen zolpidem (AMBIEN) 10 MG tablet Take 1 tablet (10 mg total) by mouth at bedtime as needed. 15 tablet 2   No facility-administered medications prior to visit.    Allergies  Allergen Reactions  . Codeine   . Sulfa Antibiotics Rash    Review of Systems  Constitutional: Negative for fatigue and fever.  HENT: Negative for congestion, ear pain, sinus pressure and sore throat.   Eyes: Negative for pain.  Respiratory: Negative for cough, chest tightness, shortness of breath and wheezing.   Cardiovascular: Negative for chest pain and palpitations.  Gastrointestinal: Negative for abdominal pain, constipation, diarrhea, nausea and vomiting.  Genitourinary: Negative for dysuria and hematuria.  Musculoskeletal: Positive for myalgias (Pain in right shoulder). Negative for arthralgias, back pain and joint swelling.  Skin: Negative for rash.  Neurological: Negative for dizziness, weakness and headaches.  Psychiatric/Behavioral: Negative for dysphoric mood. The patient is not nervous/anxious.        Objective:    Physical Exam Constitutional:      Appearance: Normal appearance.  HENT:     Right Ear: Tympanic membrane, ear canal and external ear normal.     Left Ear: Tympanic membrane, ear canal and external ear normal.     Nose: Nose normal.     Mouth/Throat:     Mouth: Mucous membranes are moist.  Cardiovascular:     Rate and Rhythm: Normal rate and regular rhythm.     Pulses: Normal pulses.     Heart sounds: Normal heart  sounds.  Pulmonary:     Effort: Pulmonary effort is normal.     Breath sounds: Normal breath sounds.  Abdominal:     Palpations: Abdomen is soft.  Musculoskeletal:        General: Normal range of motion.     Cervical back: Normal range of motion.  Skin:    General: Skin is warm and dry.  Neurological:     Mental Status: She is oriented to person, place, and time.  Psychiatric:        Mood and Affect: Mood normal.        Behavior: Behavior normal.        Thought Content: Thought content normal.        Judgment: Judgment normal.     BP (!) 142/78 (BP Location: Left Arm, Patient Position: Sitting)   Pulse 78   Temp (!) 97.2 F (36.2 C) (Temporal)   Ht 5\' 5"  (1.651 m)   Wt 266 lb (120.7 kg)   SpO2 99%   BMI 44.26 kg/m  Wt Readings from Last 3 Encounters:  09/24/20 266 lb (120.7 kg)  04/09/20 269 lb (122 kg)  03/26/20 270 lb (122.5 kg)    Health Maintenance Due  Topic Date Due  . Hepatitis C Screening  Never done  . HIV Screening  Never done  . TETANUS/TDAP  Never done  . PAP SMEAR-Modifier  Never done    There are no preventive care reminders to display for this patient.   Lab Results  Component Value Date   TSH 0.355 (L) 05/29/2020   Lab Results  Component Value Date   WBC 8.8 01/02/2020   HGB 13.2 01/02/2020   HCT 42.7 01/02/2020   MCV 85 01/02/2020   PLT 273 01/02/2020   Lab Results  Component Value Date   NA 135 01/02/2020   K 4.2 01/02/2020   CO2 24 01/02/2020   GLUCOSE 112 (H) 01/02/2020   BUN 9 01/02/2020   CREATININE 0.58 01/02/2020   BILITOT 0.2 01/02/2020   ALKPHOS 133 (H) 01/02/2020   AST 41 (H) 01/02/2020   ALT 48 (H) 01/02/2020   PROT 6.9 01/02/2020   ALBUMIN 4.0 01/02/2020   CALCIUM 8.9 01/02/2020   Lab Results  Component Value Date   CHOL 207 (H) 01/02/2020   Lab Results  Component Value Date   HDL 49 01/02/2020   Lab Results  Component Value Date   LDLCALC 138 (H) 01/02/2020   Lab Results  Component Value Date   TRIG  114 01/02/2020   Lab Results  Component Value Date   CHOLHDL 4.2 01/02/2020   Lab  Results  Component Value Date   HGBA1C 6.1 (H) 01/02/2020         Assessment & Plan:   There are no diagnoses linked to this encounter.   No orders of the defined types were placed in this encounter.    Precious Reel, CMA

## 2020-09-25 ENCOUNTER — Other Ambulatory Visit: Payer: Self-pay | Admitting: Nurse Practitioner

## 2020-09-25 DIAGNOSIS — E039 Hypothyroidism, unspecified: Secondary | ICD-10-CM

## 2020-09-25 LAB — TSH: TSH: 1.23 u[IU]/mL (ref 0.450–4.500)

## 2020-09-25 MED ORDER — LEVOTHYROXINE SODIUM 112 MCG PO TABS: 112.0000 ug | ORAL_TABLET | Freq: Every day | ORAL | 0 refills | Status: DC

## 2020-10-08 ENCOUNTER — Encounter: Payer: Self-pay | Admitting: Nurse Practitioner

## 2020-10-08 ENCOUNTER — Other Ambulatory Visit: Payer: Self-pay

## 2020-10-08 ENCOUNTER — Ambulatory Visit: Payer: BC Managed Care – PPO | Admitting: Nurse Practitioner

## 2020-10-08 VITALS — BP 158/80 | HR 71 | Temp 97.8°F | Ht 65.0 in | Wt 268.0 lb

## 2020-10-08 DIAGNOSIS — Y92009 Unspecified place in unspecified non-institutional (private) residence as the place of occurrence of the external cause: Secondary | ICD-10-CM

## 2020-10-08 DIAGNOSIS — M25511 Pain in right shoulder: Secondary | ICD-10-CM | POA: Diagnosis not present

## 2020-10-08 DIAGNOSIS — G8911 Acute pain due to trauma: Secondary | ICD-10-CM

## 2020-10-08 DIAGNOSIS — W19XXXD Unspecified fall, subsequent encounter: Secondary | ICD-10-CM | POA: Diagnosis not present

## 2020-10-08 MED ORDER — MELOXICAM 15 MG PO TABS: 15.0000 mg | ORAL_TABLET | Freq: Every day | ORAL | 0 refills | Status: DC

## 2020-10-08 NOTE — Patient Instructions (Signed)
Increase Mobic to 15 mg daily (with food)   Practice daily  range of motion exercises with right arm/shoulder to prevent frozen shoulder   Referral to PT and right shoulder MRI sent, we will call you with appointment    Rotate heat and ice 20 minutes on/ 20 minutes off for pain   No work for 2 weeks   Return in 2 weeks for follow-up    Shoulder Range of Motion Exercises Shoulder range of motion (ROM) exercises are done to keep the shoulder moving freely or to increase movement. They are often recommended for people who have shoulder pain or stiffness or who are recovering from a shoulder surgery. Phase 1 exercises When you are able, do this exercise 1-2 times per day for 30-60 seconds in each direction, or as directed by your health care provider. Pendulum exercise To do this exercise while sitting: 1. Sit in a chair or at the edge of your bed with your feet flat on the floor. 2. Let your affected arm hang down in front of you over the edge of the bed or chair. 3. Relax your shoulder, arm, and hand. 4. Rock your body so your arm gently swings in small circles. You can also use your unaffected arm to start the motion. 5. Repeat changing the direction of the circles, swinging your arm left and right, and swinging your arm forward and back. To do this exercise while standing: 1. Stand next to a sturdy chair or table, and hold on to it with your hand on your unaffected side. 2. Bend forward at the waist. 3. Bend your knees slightly. 4. Relax your shoulder, arm, and hand. 5. While keeping your shoulder relaxed, use body motion to swing your arm in small circles. 6. Repeat changing the direction of the circles, swinging your arm left and right, and swinging your arm forward and back. 7. Between exercises, stand up tall and take a short break to relax your lower back.   Phase 2 exercises Do these exercises 1-2 times per day or as told by your health care provider. Hold each stretch  for 30 seconds, and repeat 3 times. Do the exercises with one or both arms as instructed by your health care provider. For these exercises, sit at a table with your hand and arm supported by the table. A chair that slides easily or has wheels can be helpful. External rotation 1. Turn your chair so that your affected side is nearest to the table. 2. Place your forearm on the table to your side. Bend your elbow about 90 at the elbow (right angle) and place your hand palm facing down on the table. Your elbow should be about 6 inches away from your side. 3. Keeping your arm on the table, lean your body forward. Abduction 1. Turn your chair so that your affected side is nearest to the table. 2. Place your forearm and hand on the table so that your thumb points toward the ceiling and your arm is straight out to your side. 3. Slide your hand out to the side and away from you, using your unaffected arm to do the work. 4. To increase the stretch, you can slide your chair away from the table. Flexion: forward stretch 1. Sit facing the table. Place your hand and elbow on the table in front of you. 2. Slide your hand forward and away from you, using your unaffected arm to do the work. 3. To increase the stretch, you can  slide your chair backward. Phase 3 exercises Do these exercises 1-2 times per day or as told by your health care provider. Hold each stretch for 30 seconds, and repeat 3 times. Do the exercises with one or both arms as instructed by your health care provider. Cross-body stretch: posterior capsule stretch 1. Lift your arm straight out in front of you. 2. Bend your arm 90 at the elbow (right angle) so your forearm moves across your body. 3. Use your other arm to gently pull the elbow across your body, toward your other shoulder. Wall climbs 1. Stand with your affected arm extended out to the side with your hand resting on a door frame. 2. Slide your hand slowly up the door frame. 3. To  increase the stretch, step through the door frame. Keep your body upright and do not lean. Wand exercises You will need a cane, a piece of PVC pipe, or a sturdy wooden dowel for wand exercises. Flexion To do this exercise while standing: 1. Hold the wand with both of your hands, palms down. 2. Using the other arm to help, lift your arms up and over your head, if able. 3. Push upward with your other arm to gently increase the stretch. To do this exercise while lying down: 1. Lie on your back with your elbows resting on the floor and the wand in both your hands. Your hands will be palm down, or pointing toward your feet. 2. Lift your hands toward the ceiling, using your unaffected arm to help if needed. 3. Bring your arms overhead as able, using your unaffected arm to help if needed. Internal rotation 1. Stand while holding the wand behind you with both hands. Your unaffected arm should be extended above your head with the arm of the affected side extended behind you at the level of your waist. The wand should be pointing straight up and down as you hold it. 2. Slowly pull the wand up behind your back by straightening the elbow of your unaffected arm and bending the elbow of your affected arm. External rotation 1. Lie on your back with your affected upper arm supported on a small pillow or rolled towel. When you first do this exercise, keep your upper arm close to your body. Over time, bring your arm up to a 90 angle out to the side. 2. Hold the wand across your stomach and with both hands palm up. Your elbow on your affected side should be bent at a 90 angle. 3. Use your unaffected side to help push your forearm away from you and toward the floor. Keep your elbow on your affected side bent at a 90 angle. Contact a health care provider if you have:  New or increasing pain.  New numbness, tingling, weakness, or discoloration in your arm or hand. This information is not intended to replace  advice given to you by your health care provider. Make sure you discuss any questions you have with your health care provider. Document Revised: 09/15/2017 Document Reviewed: 09/15/2017 Elsevier Patient Education  2021 [employer]  Shoulder Pain Many things can cause shoulder pain, including:  An injury.  Moving the shoulder in the same way again and again (overuse).  Joint pain (arthritis). Pain can come from:  Swelling and irritation (inflammation) of any part of the shoulder.  An injury to the shoulder joint.  An injury to: ? Tissues that connect muscle to bone (tendons). ? Tissues that connect bones to each other (ligaments). ? Bones.  Follow these instructions at home: Watch for changes in your symptoms. Let your doctor know about them. Follow these instructions to help with your pain. If you have a sling:  Wear the sling as told by your doctor. Remove it only as told by your doctor.  Loosen the sling if your fingers: ? Tingle. ? Become numb. ? Turn cold and blue.  Keep the sling clean.  If the sling is not waterproof: ? Do not let it get wet. ? Take the sling off when you shower or bathe. Managing pain, stiffness, and swelling  If told, put ice on the painful area: ? Put ice in a plastic bag. ? Place a towel between your skin and the bag. ? Leave the ice on for 20 minutes, 2-3 times a day. Stop putting ice on if it does not help with the pain.  Squeeze a soft ball or a foam pad as much as possible. This prevents swelling in the shoulder. It also helps to strengthen the arm.   General instructions  Take over-the-counter and prescription medicines only as told by your doctor.  Keep all follow-up visits as told by your doctor. This is important. Contact a doctor if:  Your pain gets worse.  Medicine does not help your pain.  You have new pain in your arm, hand, or fingers. Get help right away if:  Your arm, hand, or fingers: ? Tingle. ? Are  numb. ? Are swollen. ? Are painful. ? Turn white or blue. Summary  Shoulder pain can be caused by many things. These include injury, moving the shoulder in the same away again and again, and joint pain.  Watch for changes in your symptoms. Let your doctor know about them.  This condition may be treated with a sling, ice, and pain medicine.  Contact your doctor if the pain gets worse or you have new pain. Get help right away if your arm, hand, or fingers tingle or get numb, swollen, or painful.  Keep all follow-up visits as told by your doctor. This is important. This information is not intended to replace advice given to you by your health care provider. Make sure you discuss any questions you have with your health care provider. Document Revised: 02/15/2018 Document Reviewed: 02/15/2018 Elsevier Patient Education  2021 [employer].  Adhesive Capsulitis  Adhesive capsulitis, also called frozen shoulder, causes the shoulder to become stiff and painful to move. This condition happens when there is inflammation of the tendons and ligaments that surround the shoulder joint (shoulder capsule). What are the causes? This condition may be caused by:  An injury to your shoulder joint.  Straining your shoulder.  Not moving your shoulder for a period of time. This can happen if your arm was injured or in a sling.  Long-standing conditions, such as: ? Diabetes. ? Thyroid problems. ? Heart disease. ? Stroke. ? Rheumatoid arthritis. ? Lung disease. In some cases, the cause is not known. What increases the risk? You are more likely to develop this condition if you are:  A woman.  Older than 54 years of age. What are the signs or symptoms? Symptoms of this condition include:  Pain in your shoulder when you move your arm. There may also be pain when parts of your shoulder are touched. The pain may be worse at night or when you are resting.  A sore or aching shoulder.  The  inability to move your shoulder normally.  Muscle spasms. How is this diagnosed? This  condition is diagnosed with a physical exam and imaging tests, such as an X-ray or MRI. How is this treated? This condition may be treated with:  Treatment of the underlying cause or condition.  Medicine. Medicine may be given to relieve pain, inflammation, or muscle spasms.  Steroid injections into the shoulder joint.  Physical therapy. This involves performing exercises to get the shoulder moving again.  Acupuncture. This is a type of treatment that involves stimulating specific points on your body by inserting thin needles through your skin.  Shoulder manipulation. This is a procedure to move the shoulder into another position. It is done after you are given a medicine to make you fall asleep (general anesthetic). The joint may also be injected with salt water at high pressure to break down scarring.  Surgery. This may be done in severe cases when other treatments have failed. Although most people recover completely from adhesive capsulitis, some may not regain full shoulder movement. Follow these instructions at home: Managing pain, stiffness, and swelling  If directed, put ice on the injured area: ? Put ice in a plastic bag. ? Place a towel between your skin and the bag. ? Leave the ice on for 20 minutes, 2-3 times per day.  If directed, apply heat to the affected area before you exercise. Use the heat source that your health care provider recommends, such as a moist heat pack or a heating pad. ? Place a towel between your skin and the heat source. ? Leave the heat on for 20-30 minutes. ? Remove the heat if your skin turns bright red. This is especially important if you are unable to feel pain, heat, or cold. You may have a greater risk of getting burned.      General instructions  Take over-the-counter and prescription medicines only as told by your health care provider.  If you are  being treated with physical therapy, follow instructions from your physical therapist.  Avoid exercises that put a lot of demand on your shoulder, such as throwing. These exercises can make pain worse.  Keep all follow-up visits as told by your health care provider. This is important. Contact a health care provider if:  You develop new symptoms.  Your symptoms get worse. Summary  Adhesive capsulitis, also called frozen shoulder, causes the shoulder to become stiff and painful to move.  You are more likely to have this condition if you are a woman and over age 11.  It is treated with physical therapy, medicines, and sometimes surgery. This information is not intended to replace advice given to you by your health care provider. Make sure you discuss any questions you have with your health care provider. Document Revised: 01/07/2018 Document Reviewed: 01/07/2018 Elsevier Patient Education  2021 [employer].

## 2020-10-08 NOTE — Progress Notes (Signed)
Acute Office Visit  Subjective:    Patient ID: Kristen Vazquez, female    DOB: 1966/05/29, 55 y.o.   MRN: [id number]  Chief Complaint  Patient presents with  . Right shoulder pain    2 week recheck    HPI Patient is in today for right shoulder pain. She sustained a fall at home on 09/23/20 by slipping in mud, landing on her right forearm. She has experienced right shoulder pain and decreased ROM since fall.  X-ray revealed no fracture. Conservative treatment have failed including Mobic 7.5 mg daily, Toradol 60 mg IM injection, Flexeril 5 mg for muscle spasms, and range-of-motion exercises. She continues to experience pain and limited ROM to right arm/shoulder. She is right hand dominant. She tells me that she has financial concerns because she has missed two-weeks of work.   Past Medical History:  Diagnosis Date  . Asthma   . COPD (chronic obstructive pulmonary disease) (HCC)   . Depression   . GERD (gastroesophageal reflux disease)   . Kidney stones     Past Surgical History:  Procedure Laterality Date  . HERNIA REPAIR  2014    Family History  Problem Relation Age of Onset  . Breast cancer Mother   . Leukemia Father   . Heart failure Brother   . Alcohol abuse Brother     Social History   Socioeconomic History  . Marital status: Widowed    Spouse name: Not on file  . Number of children: 1  . Years of education: Not on file  . Highest education level: Not on file  Occupational History  . Occupation: [employer]  Tobacco Use  . Smoking status: Former Smoker    Types: Cigarettes    Quit date: 2001    Years since quitting: 21.1  . Smokeless tobacco: Never Used  Vaping Use  . Vaping Use: Never used  Substance and Sexual Activity  . Alcohol use: Yes    Alcohol/week: 2.0 standard drinks    Types: 2 Glasses of wine per week    Comment: SOCIAL  . Drug use: Never  . Sexual activity: Not Currently  Other Topics Concern  . Not on file  Social History  Narrative  . Not on file   Social Determinants of Health   Financial Resource Strain: Not on file  Food Insecurity: Not on file  Transportation Needs: Not on file  Physical Activity: Not on file  Stress: Not on file  Social Connections: Not on file  Intimate Partner Violence: Not on file    Outpatient Medications Prior to Visit  Medication Sig Dispense Refill  . albuterol (VENTOLIN HFA) 108 (90 Base) MCG/ACT inhaler Inhale 2 puffs into the lungs every 6 (six) hours as needed for wheezing or shortness of breath. 8 g 5  . cyclobenzaprine (FLEXERIL) 5 MG tablet Take 1 tablet (5 mg total) by mouth 3 (three) times daily as needed for muscle spasms. 30 tablet 0  . escitalopram (LEXAPRO) 20 MG tablet Take 1 tablet (20 mg total) by mouth daily. 90 tablet 2  . levothyroxine (SYNTHROID) 112 MCG tablet Take 1 tablet (112 mcg total) by mouth daily. 90 tablet 0  . LORazepam (ATIVAN) 0.5 MG tablet Take 1 tablet (0.5 mg total) by mouth daily as needed for anxiety. 30 tablet 1  . meloxicam (MOBIC) 7.5 MG tablet Take 7.5 mg by mouth daily. FOR FOOT PAIN    . omeprazole (PRILOSEC) 20 MG capsule Take 20 mg by mouth  daily.    . zolpidem (AMBIEN) 10 MG tablet Take 1 tablet (10 mg total) by mouth at bedtime as needed. 15 tablet 2   No facility-administered medications prior to visit.    Allergies  Allergen Reactions  . Codeine   . Sulfa Antibiotics Rash    Review of Systems  Constitutional: Negative for fatigue and fever.  HENT: Negative for congestion, ear pain, sinus pressure and sore throat.   Eyes: Negative for pain.  Respiratory: Negative for cough, chest tightness, shortness of breath and wheezing.   Cardiovascular: Negative for chest pain and palpitations.  Gastrointestinal: Negative for abdominal pain, constipation, diarrhea, nausea and vomiting.  Genitourinary: Negative for dysuria and hematuria.  Musculoskeletal: Negative for arthralgias, back pain, joint swelling and myalgias.        Right shoulder pain due to trauma still not better.  Skin: Negative for rash.  Neurological: Negative for dizziness, weakness and headaches.  Psychiatric/Behavioral: Negative for dysphoric mood. The patient is not nervous/anxious.        Objective:    Physical Exam Vitals reviewed.  Constitutional:      Appearance: Normal appearance.  HENT:     Head: Normocephalic.  Cardiovascular:     Rate and Rhythm: Normal rate and regular rhythm.     Pulses: Normal pulses.     Heart sounds: Normal heart sounds.  Pulmonary:     Effort: Pulmonary effort is normal.     Breath sounds: Normal breath sounds.  Abdominal:     General: Bowel sounds are normal.     Palpations: Abdomen is soft.  Musculoskeletal:        General: Tenderness present.     Right shoulder: Tenderness and bony tenderness present. Decreased range of motion. Decreased strength. Normal pulse.     Cervical back: Normal range of motion.  Skin:    General: Skin is warm and dry.     Capillary Refill: Capillary refill takes less than 2 seconds.  Neurological:     General: No focal deficit present.     Mental Status: She is alert and oriented to person, place, and time.  Psychiatric:        Mood and Affect: Mood normal.        Behavior: Behavior normal.     BP (!) 158/80 (BP Location: Left Arm, Patient Position: Sitting)   Pulse 71   Temp 97.8 F (36.6 C) (Temporal)   Ht 5\' 5"  (1.651 m)   Wt 268 lb (121.6 kg)   SpO2 98%   BMI 44.60 kg/m  Wt Readings from Last 3 Encounters:  10/08/20 268 lb (121.6 kg)  09/24/20 266 lb (120.7 kg)  04/09/20 269 lb (122 kg)    Health Maintenance Due  Topic Date Due  . Hepatitis C Screening  Never done  . HIV Screening  Never done  . TETANUS/TDAP  Never done  . PAP SMEAR-Modifier  Never done  . MAMMOGRAM  10/04/2020       Lab Results  Component Value Date   TSH 1.230 09/24/2020   Lab Results  Component Value Date   WBC 8.8 01/02/2020   HGB 13.2 01/02/2020   HCT 42.7  01/02/2020   MCV 85 01/02/2020   PLT 273 01/02/2020   Lab Results  Component Value Date   NA 135 01/02/2020   K 4.2 01/02/2020   CO2 24 01/02/2020   GLUCOSE 112 (H) 01/02/2020   BUN 9 01/02/2020   CREATININE 0.58 01/02/2020   BILITOT 0.2 01/02/2020  ALKPHOS 133 (H) 01/02/2020   AST 41 (H) 01/02/2020   ALT 48 (H) 01/02/2020   PROT 6.9 01/02/2020   ALBUMIN 4.0 01/02/2020   CALCIUM 8.9 01/02/2020   Lab Results  Component Value Date   CHOL 207 (H) 01/02/2020   Lab Results  Component Value Date   HDL 49 01/02/2020   Lab Results  Component Value Date   LDLCALC 138 (H) 01/02/2020   Lab Results  Component Value Date   TRIG 114 01/02/2020   Lab Results  Component Value Date   CHOLHDL 4.2 01/02/2020   Lab Results  Component Value Date   HGBA1C 6.1 (H) 01/02/2020         Assessment & Plan:   1. Acute pain of right shoulder due to trauma - Ambulatory referral to Physical Therapy - meloxicam (MOBIC) 15 MG tablet; Take 1 tablet (15 mg total) by mouth daily.  Dispense: 30 tablet; Refill: 0 - MR Shoulder Right Wo Contrast  2. Fall at home, subsequent encounter    Increase Mobic to 15 mg daily (with food)  Practice daily  range of motion exercises with right arm/shoulder to prevent frozen shoulder  Referral to PT and right shoulder MRI sent, we will call you with appointment  Rotate heat and ice 20 minutes on/ 20 minutes off for pain  No work for 2 weeks  Return in 2 weeks for follow-up    Follow-up: 2 weeks  Signed, Flonnie Hailstone, DNP

## 2020-10-14 ENCOUNTER — Telehealth: Payer: Self-pay

## 2020-10-14 NOTE — Telephone Encounter (Signed)
I have not been seeing this patient for that --- you need to send to [facility]

## 2020-10-14 NOTE — Telephone Encounter (Signed)
Kennon Rounds pt's insurance is requiring a peer to peer for pt to get her MRI done on her shoulder. They told me to tell you that The ordering provider can call [phone] for a peer-to-peer discussion with an [employer] physician reviewer.

## 2020-10-16 ENCOUNTER — Telehealth: Payer: Self-pay

## 2020-10-16 ENCOUNTER — Telehealth: Payer: Self-pay | Admitting: Nurse Practitioner

## 2020-10-16 NOTE — Telephone Encounter (Signed)
Kristen Vazquez please advise pt's insurance is requiring a peer to peer for pt to get her MRI done on her shoulder. They told me to tell you that The ordering provider can call [phone] for a peer-to-peer discussion with an [employer] physician reviewer.

## 2020-10-23 ENCOUNTER — Encounter: Payer: Self-pay | Admitting: Physician Assistant

## 2020-10-23 ENCOUNTER — Telehealth: Payer: Self-pay | Admitting: Nurse Practitioner

## 2020-10-23 ENCOUNTER — Other Ambulatory Visit: Payer: Self-pay

## 2020-10-23 ENCOUNTER — Ambulatory Visit: Payer: BC Managed Care – PPO | Admitting: Physician Assistant

## 2020-10-23 VITALS — BP 126/82 | HR 75 | Temp 96.8°F | Ht 65.0 in | Wt 267.8 lb

## 2020-10-23 DIAGNOSIS — M25511 Pain in right shoulder: Secondary | ICD-10-CM

## 2020-10-23 DIAGNOSIS — G8911 Acute pain due to trauma: Secondary | ICD-10-CM

## 2020-10-23 DIAGNOSIS — M67911 Unspecified disorder of synovium and tendon, right shoulder: Secondary | ICD-10-CM

## 2020-10-23 DIAGNOSIS — M79601 Pain in right arm: Secondary | ICD-10-CM

## 2020-10-23 HISTORY — DX: Pain in right arm: M79.601

## 2020-10-23 HISTORY — DX: Acute pain due to trauma: M25.511

## 2020-10-23 HISTORY — DX: Pain in right shoulder: G89.11

## 2020-10-23 NOTE — Progress Notes (Signed)
Acute Office Visit  Subjective:    Patient ID: Kristen Vazquez, female    DOB: 09-16-1965, 55 y.o.   MRN: [id number]  Chief Complaint  Patient presents with  . Arm Pain  .     Patient is in today for follow up of  right shoulder and right arm  pain. She sustained a fall at home on 09/23/20 by slipping in mud, landing on her right elbow. She has experienced right shoulder pain /right upper arm pain and decreased ROM since fall.  X-ray of shoulder revealed no fracture.MRI was done which showed severe rotator cuff tendonosis -  Conservative treatment have failed including Mobic 7.5 mg daily, Toradol 60 mg IM injection, Flexeril 5 mg for muscle spasms, and range-of-motion exercises including seeing physical therapy last week She continues to experience pain and limited ROM to right arm/shoulder. She is right hand dominant.   Past Medical History:  Diagnosis Date  . Asthma   . COPD (chronic obstructive pulmonary disease) (HCC)   . Depression   . GERD (gastroesophageal reflux disease)   . Kidney stones     Past Surgical History:  Procedure Laterality Date  . HERNIA REPAIR  2014    Family History  Problem Relation Age of Onset  . Breast cancer Mother   . Leukemia Father   . Heart failure Brother   . Alcohol abuse Brother     Social History   Socioeconomic History  . Marital status: Widowed    Spouse name: Not on file  . Number of children: 1  . Years of education: Not on file  . Highest education level: Not on file  Occupational History  . Occupation: [employer]  Tobacco Use  . Smoking status: Former Smoker    Types: Cigarettes    Quit date: 2001    Years since quitting: 21.1  . Smokeless tobacco: Never Used  Vaping Use  . Vaping Use: Never used  Substance and Sexual Activity  . Alcohol use: Yes    Alcohol/week: 2.0 standard drinks    Types: 2 Glasses of wine per week    Comment: SOCIAL  . Drug use: Never  . Sexual activity: Not Currently  Other  Topics Concern  . Not on file  Social History Narrative  . Not on file   Social Determinants of Health   Financial Resource Strain: Not on file  Food Insecurity: Not on file  Transportation Needs: Not on file  Physical Activity: Not on file  Stress: Not on file  Social Connections: Not on file  Intimate Partner Violence: Not on file    Outpatient Medications Prior to Visit  Medication Sig Dispense Refill  . albuterol (VENTOLIN HFA) 108 (90 Base) MCG/ACT inhaler Inhale 2 puffs into the lungs every 6 (six) hours as needed for wheezing or shortness of breath. 8 g 5  . cyclobenzaprine (FLEXERIL) 5 MG tablet Take 1 tablet (5 mg total) by mouth 3 (three) times daily as needed for muscle spasms. 30 tablet 0  . escitalopram (LEXAPRO) 20 MG tablet Take 1 tablet (20 mg total) by mouth daily. 90 tablet 2  . levothyroxine (SYNTHROID) 112 MCG tablet Take 1 tablet (112 mcg total) by mouth daily. 90 tablet 0  . LORazepam (ATIVAN) 0.5 MG tablet Take 1 tablet (0.5 mg total) by mouth daily as needed for anxiety. 30 tablet 1  . meloxicam (MOBIC) 15 MG tablet Take 1 tablet (15 mg total) by mouth daily. 30 tablet 0  .  omeprazole (PRILOSEC) 20 MG capsule Take 20 mg by mouth daily.    Marland Kitchen zolpidem (AMBIEN) 10 MG tablet Take 1 tablet (10 mg total) by mouth at bedtime as needed. 15 tablet 2   No facility-administered medications prior to visit.    Allergies  Allergen Reactions  . Codeine   . Sulfa Antibiotics Rash    .CONSTITUTIONAL: Negative for chills, fatigue, fever, unintentional weight gain and unintentional weight loss.  CARDIOVASCULAR: Negative for chest pain, dizziness, palpitations and pedal edema.  RESPIRATORY: Negative for recent cough and dyspnea.  MSK:see HPI INTEGUMENTARY: Negative for rash.          Objective:     BP 126/82 (BP Location: Left Arm, Patient Position: Sitting, Cuff Size: Large)   Pulse 75   Temp (!) 96.8 F (36 C) (Temporal)   Ht 5\' 5"  (1.651 m)   Wt 267 lb 12.8  oz (121.5 kg)   SpO2 96%   BMI 44.56 kg/m  Wt Readings from Last 3 Encounters:  10/23/20 267 lb 12.8 oz (121.5 kg)  10/08/20 268 lb (121.6 kg)  09/24/20 266 lb (120.7 kg)   .CONSTITUTIONAL: Negative for chills, fatigue, fever, unintentional weight gain and unintentional weight loss.  CARDIOVASCULAR: Negative for chest pain, dizziness, palpitations and pedal edema.  RESPIRATORY: Negative for recent cough and dyspnea.  MSK: pt has moderate pain to palpation across right shoulder but most of pain is actually in her right upper arm - swelling is noted above right elbow and along upper shoulder She has decreased rom in her arm/shoulder INTEGUMENTARY: Negative for rash.  Health Maintenance Due  Topic Date Due  . Hepatitis C Screening  Never done  . HIV Screening  Never done  . TETANUS/TDAP  Never done  . PAP SMEAR-Modifier  Never done  . MAMMOGRAM  10/04/2020       Lab Results  Component Value Date   TSH 1.230 09/24/2020   Lab Results  Component Value Date   WBC 8.8 01/02/2020   HGB 13.2 01/02/2020   HCT 42.7 01/02/2020   MCV 85 01/02/2020   PLT 273 01/02/2020   Lab Results  Component Value Date   NA 135 01/02/2020   K 4.2 01/02/2020   CO2 24 01/02/2020   GLUCOSE 112 (H) 01/02/2020   BUN 9 01/02/2020   CREATININE 0.58 01/02/2020   BILITOT 0.2 01/02/2020   ALKPHOS 133 (H) 01/02/2020   AST 41 (H) 01/02/2020   ALT 48 (H) 01/02/2020   PROT 6.9 01/02/2020   ALBUMIN 4.0 01/02/2020   CALCIUM 8.9 01/02/2020   Lab Results  Component Value Date   CHOL 207 (H) 01/02/2020   Lab Results  Component Value Date   HDL 49 01/02/2020   Lab Results  Component Value Date   LDLCALC 138 (H) 01/02/2020   Lab Results  Component Value Date   TRIG 114 01/02/2020   Lab Results  Component Value Date   CHOLHDL 4.2 01/02/2020   Lab Results  Component Value Date   HGBA1C 6.1 (H) 01/02/2020         Assessment & Plan:   1. Acute pain of right shoulder due to trauma  2.  Pain of right upper extremity   Pt to continue meds as directed but at this juncture recommend ortho referral -- pt was set up to see Dr 01/04/2020 on 3/11 at 10 am

## 2020-11-05 ENCOUNTER — Encounter: Payer: Self-pay | Admitting: Physician Assistant

## 2020-11-05 ENCOUNTER — Telehealth: Payer: Self-pay

## 2020-11-05 NOTE — Telephone Encounter (Signed)
Called pt. Pt states she received cortisone injection at ortho on Tuesday. Red, hot, itchy circle started Thursday. Pt did not know whether it was injection or a bite of some sort. She stated she had a low grade 99 temperature yesterday but has resolved. Pt states she took benadryl last night and was able to sleep on it and neosporin has been helping. Pt decided to hold off on appointment as symptoms are easing with benadryl and neosporin. Advised if symptoms maintain or worsen to make an appointment. Pt VU.   Lorita Officer, CCMA 11/05/20 10:07 AM

## 2021-01-03 ENCOUNTER — Other Ambulatory Visit: Payer: Self-pay | Admitting: Legal Medicine

## 2021-01-03 ENCOUNTER — Other Ambulatory Visit: Payer: Self-pay | Admitting: Nurse Practitioner

## 2021-01-03 DIAGNOSIS — F331 Major depressive disorder, recurrent, moderate: Secondary | ICD-10-CM

## 2021-01-03 DIAGNOSIS — E039 Hypothyroidism, unspecified: Secondary | ICD-10-CM

## 2021-01-06 ENCOUNTER — Other Ambulatory Visit: Payer: Self-pay

## 2021-01-06 ENCOUNTER — Other Ambulatory Visit: Payer: Self-pay | Admitting: Physician Assistant

## 2021-01-06 ENCOUNTER — Ambulatory Visit
Admission: RE | Admit: 2021-01-06 | Discharge: 2021-01-06 | Disposition: A | Discharge status: Home / Self Care | Payer: BC Managed Care – PPO | Source: Ambulatory Visit | Attending: Physician Assistant | Admitting: Physician Assistant

## 2021-01-06 DIAGNOSIS — Z1231 Encounter for screening mammogram for malignant neoplasm of breast: Secondary | ICD-10-CM

## 2021-01-06 MED ORDER — LEVOTHYROXINE SODIUM 112 MCG PO TABS: 112.0000 ug | ORAL_TABLET | Freq: Every day | ORAL | 1 refills | Status: DC

## 2021-01-29 ENCOUNTER — Ambulatory Visit: Payer: BC Managed Care – PPO | Admitting: Physician Assistant

## 2021-02-12 NOTE — Telephone Encounter (Deleted)
Error

## 2021-02-12 NOTE — Telephone Encounter (Signed)
error 

## 2021-02-12 NOTE — Telephone Encounter (Signed)
Error

## 2021-03-05 ENCOUNTER — Encounter: Payer: Self-pay | Admitting: Physician Assistant

## 2021-03-05 ENCOUNTER — Ambulatory Visit: Payer: BC Managed Care – PPO | Admitting: Physician Assistant

## 2021-03-05 ENCOUNTER — Other Ambulatory Visit: Payer: Self-pay

## 2021-03-05 VITALS — BP 132/78 | HR 78 | Temp 97.0°F | Ht 65.0 in | Wt 284.0 lb

## 2021-03-05 DIAGNOSIS — E039 Hypothyroidism, unspecified: Secondary | ICD-10-CM

## 2021-03-05 DIAGNOSIS — E782 Mixed hyperlipidemia: Secondary | ICD-10-CM

## 2021-03-05 DIAGNOSIS — F419 Anxiety disorder, unspecified: Secondary | ICD-10-CM | POA: Diagnosis not present

## 2021-03-05 DIAGNOSIS — K219 Gastro-esophageal reflux disease without esophagitis: Secondary | ICD-10-CM

## 2021-03-05 DIAGNOSIS — N921 Excessive and frequent menstruation with irregular cycle: Secondary | ICD-10-CM

## 2021-03-05 DIAGNOSIS — F331 Major depressive disorder, recurrent, moderate: Secondary | ICD-10-CM

## 2021-03-05 DIAGNOSIS — R252 Cramp and spasm: Secondary | ICD-10-CM

## 2021-03-05 MED ORDER — ESCITALOPRAM OXALATE 20 MG PO TABS
20.0000 mg | ORAL_TABLET | Freq: Every day | ORAL | 1 refills | Status: DC
Start: 2021-03-05 — End: 2021-06-10

## 2021-03-05 MED ORDER — LORAZEPAM 0.5 MG PO TABS: 0.5000 mg | ORAL_TABLET | Freq: Every day | ORAL | 1 refills | Status: DC | PRN

## 2021-03-05 NOTE — Progress Notes (Signed)
Established Patient Office Visit  Subjective:  Patient ID: Kristen Vazquez, female    DOB: January 18, 1966  Age: 55 y.o. MRN: [id number]  CC:  Chief Complaint  Patient presents with   Hypothyroidism    HPI Kristen Vazquez presents for chronic follow up of depression, GERD, asthma , hyperlipidemia and hypothyroidism  Pt currently on levothyroxine - is due for labwork and voices no problems with this  Pt taking lexapro 20mg  qd for anxiety/depression - states the med working well for her - she is using ativan as needed and that is doing well  Pt with history of GERD - currently on prilosec 20mg  qd controlling symptoms  Pt states that for the past several months she has had irregular menstrual cycles with significant bleeding - is overdue for pap as well and would like referral to GYN  Pt has been having some leg cramping - states happens all times during day - she does have flexeril at home - mentioned she could try to take that at night to see if helpful   Past Medical History:  Diagnosis Date   Asthma    COPD (chronic obstructive pulmonary disease) (HCC)    Depression    GERD (gastroesophageal reflux disease)    Kidney stones     Past Surgical History:  Procedure Laterality Date   HERNIA REPAIR  2014    Family History  Problem Relation Age of Onset   Breast cancer Mother    Leukemia Father    Heart failure Brother    Alcohol abuse Brother     Social History   Socioeconomic History   Marital status: Widowed    Spouse name: Not on file   Number of children: 1   Years of education: Not on file   Highest education level: Not on file  Occupational History   Occupation: [employer]  Tobacco Use   Smoking status: Former    Types: Cigarettes    Quit date: 2001    Years since quitting: 21.5   Smokeless tobacco: Never  Vaping Use   Vaping Use: Never used  Substance and Sexual Activity   Alcohol use: Yes    Alcohol/week: 2.0 standard drinks     Types: 2 Glasses of wine per week    Comment: SOCIAL   Drug use: Never   Sexual activity: Not Currently  Other Topics Concern   Not on file  Social History Narrative   Not on file   Social Determinants of Health   Financial Resource Strain: Not on file  Food Insecurity: Not on file  Transportation Needs: Not on file  Physical Activity: Not on file  Stress: Not on file  Social Connections: Not on file  Intimate Partner Violence: Not on file     Current Outpatient Medications:    albuterol (VENTOLIN HFA) 108 (90 Base) MCG/ACT inhaler, Inhale 2 puffs into the lungs every 6 (six) hours as needed for wheezing or shortness of breath., Disp: 8 g, Rfl: 5   cyclobenzaprine (FLEXERIL) 5 MG tablet, Take 1 tablet (5 mg total) by mouth 3 (three) times daily as needed for muscle spasms., Disp: 30 tablet, Rfl: 0   levothyroxine (SYNTHROID) 112 MCG tablet, Take 1 tablet (112 mcg total) by mouth daily., Disp: 90 tablet, Rfl: 1   meloxicam (MOBIC) 15 MG tablet, Take 1 tablet (15 mg total) by mouth daily., Disp: 30 tablet, Rfl: 0   omeprazole (PRILOSEC) 20 MG capsule, Take 20 mg by mouth  daily., Disp: , Rfl:    zolpidem (AMBIEN) 10 MG tablet, Take 1 tablet (10 mg total) by mouth at bedtime as needed., Disp: 15 tablet, Rfl: 2   escitalopram (LEXAPRO) 20 MG tablet, Take 1 tablet (20 mg total) by mouth daily., Disp: 90 tablet, Rfl: 1   LORazepam (ATIVAN) 0.5 MG tablet, Take 1 tablet (0.5 mg total) by mouth daily as needed for anxiety., Disp: 30 tablet, Rfl: 1   Allergies  Allergen Reactions   Codeine    Sulfa Antibiotics Rash    ROS CONSTITUTIONAL: Negative for chills, fatigue, fever, unintentional weight gain and unintentional weight loss.  CARDIOVASCULAR: Negative for chest pain, dizziness, palpitations and pedal edema.  RESPIRATORY: Negative for recent cough and dyspnea.  GASTROINTESTINAL: Negative for abdominal pain, acid reflux symptoms, constipation, diarrhea, nausea and vomiting.  GU -  see HPI PSYCHIATRIC: Negative for sleep disturbance and to question depression screen.  Negative for depression, negative for anhedonia.        Objective:   PHYSICAL EXAM:   VS: BP 132/78 (BP Location: Left Arm, Patient Position: Sitting, Cuff Size: Normal)   Pulse 78   Temp (!) 97 F (36.1 C) (Temporal)   Ht 5\' 5"  (1.651 m)   Wt 284 lb (128.8 kg)   SpO2 94%   BMI 47.26 kg/m   GEN: Well nourished, well developed, in no acute distress  Cardiac: RRR; no murmurs, rubs, or gallops, Respiratory:  normal respiratory rate and pattern with no distress - normal breath sounds with no rales, rhonchi, wheezes or rubs Skin: warm and dry, no rash  Neuro:  Alert and Oriented x 3, Strength and sensation are intact - CN II-Xii grossly intact Psych: euthymic mood, appropriate affect and demeanor  Wt Readings from Last 3 Encounters:  03/05/21 284 lb (128.8 kg)  10/23/20 267 lb 12.8 oz (121.5 kg)  10/08/20 268 lb (121.6 kg)     Health Maintenance Due  Topic Date Due   PAP SMEAR-Modifier  Never done    There are no preventive care reminders to display for this patient.  Lab Results  Component Value Date   TSH 1.230 09/24/2020   Lab Results  Component Value Date   WBC 8.8 01/02/2020   HGB 13.2 01/02/2020   HCT 42.7 01/02/2020   MCV 85 01/02/2020   PLT 273 01/02/2020   Lab Results  Component Value Date   NA 135 01/02/2020   K 4.2 01/02/2020   CO2 24 01/02/2020   GLUCOSE 112 (H) 01/02/2020   BUN 9 01/02/2020   CREATININE 0.58 01/02/2020   BILITOT 0.2 01/02/2020   ALKPHOS 133 (H) 01/02/2020   AST 41 (H) 01/02/2020   ALT 48 (H) 01/02/2020   PROT 6.9 01/02/2020   ALBUMIN 4.0 01/02/2020   CALCIUM 8.9 01/02/2020   Lab Results  Component Value Date   CHOL 207 (H) 01/02/2020   Lab Results  Component Value Date   HDL 49 01/02/2020   Lab Results  Component Value Date   LDLCALC 138 (H) 01/02/2020   Lab Results  Component Value Date   TRIG 114 01/02/2020   Lab Results   Component Value Date   CHOLHDL 4.2 01/02/2020   Lab Results  Component Value Date   HGBA1C 6.1 (H) 01/02/2020      Assessment & Plan:   Problem List Items Addressed This Visit       Digestive   Gastroesophageal reflux disease without esophagitis Continue meds as directed     Endocrine  Acquired hypothyroidism - Primary   Relevant Orders   TSH Continue meds     Other   Major depressive disorder, recurrent episode, moderate (HCC)   Relevant Medications   escitalopram (LEXAPRO) 20 MG tablet   LORazepam (ATIVAN) 0.5 MG tablet   Mixed hyperlipidemia   Relevant Orders   CBC with Differential/Platelet   Comprehensive metabolic panel   Lipid panel   Anxiety   Relevant Medications   escitalopram (LEXAPRO) 20 MG tablet   LORazepam (ATIVAN) 0.5 MG tablet   Other Visit Diagnoses     Leg cramps       Relevant Orders   Comprehensive metabolic panel   Magnesium   Menorrhagia with irregular cycle       Relevant Orders   Ambulatory referral to Gynecology       Meds ordered this encounter  Medications   escitalopram (LEXAPRO) 20 MG tablet    Sig: Take 1 tablet (20 mg total) by mouth daily.    Dispense:  90 tablet    Refill:  1    Order Specific Question:   Supervising Provider    Answer:   COX, Aniceto Boss   LORazepam (ATIVAN) 0.5 MG tablet    Sig: Take 1 tablet (0.5 mg total) by mouth daily as needed for anxiety.    Dispense:  30 tablet    Refill:  1    Order Specific Question:   Supervising Provider    AnswerCorey Harold    Follow-up: Return in about 3 months (around 06/05/2021) for chronic fasting.    SARA R Lillian Ballester, PA-C

## 2021-03-06 ENCOUNTER — Other Ambulatory Visit: Payer: Self-pay | Admitting: Physician Assistant

## 2021-03-06 DIAGNOSIS — E039 Hypothyroidism, unspecified: Secondary | ICD-10-CM

## 2021-03-06 LAB — CBC WITH DIFFERENTIAL/PLATELET
Basophils Absolute: 0.1 10*3/uL (ref 0.0–0.2)
Basos: 1 %
EOS (ABSOLUTE): 0.2 10*3/uL (ref 0.0–0.4)
Eos: 2 %
Hematocrit: 42.4 % (ref 34.0–46.6)
Hemoglobin: 13.6 g/dL (ref 11.1–15.9)
Immature Grans (Abs): 0.1 10*3/uL (ref 0.0–0.1)
Immature Granulocytes: 1 %
Lymphocytes Absolute: 2.7 10*3/uL (ref 0.7–3.1)
Lymphs: 25 %
MCH: 28 pg (ref 26.6–33.0)
MCHC: 32.1 g/dL (ref 31.5–35.7)
MCV: 87 fL (ref 79–97)
Monocytes Absolute: 0.8 10*3/uL (ref 0.1–0.9)
Monocytes: 7 %
Neutrophils Absolute: 6.9 10*3/uL (ref 1.4–7.0)
Neutrophils: 64 %
Platelets: 281 10*3/uL (ref 150–450)
RBC: 4.86 x10E6/uL (ref 3.77–5.28)
RDW: 13.1 % (ref 11.7–15.4)
WBC: 10.7 10*3/uL (ref 3.4–10.8)

## 2021-03-06 LAB — COMPREHENSIVE METABOLIC PANEL
ALT: 27 IU/L (ref 0–32)
AST: 19 IU/L (ref 0–40)
Albumin/Globulin Ratio: 1.5 (ref 1.2–2.2)
Albumin: 4 g/dL (ref 3.8–4.9)
Alkaline Phosphatase: 116 IU/L (ref 44–121)
BUN/Creatinine Ratio: 16 (ref 9–23)
BUN: 11 mg/dL (ref 6–24)
Bilirubin Total: <0.2 mg/dL (ref 0.0–1.2)
CO2: 27 mmol/L (ref 20–29)
Calcium: 9.3 mg/dL (ref 8.7–10.2)
Chloride: 102 mmol/L (ref 96–106)
Creatinine, Ser: 0.7 mg/dL (ref 0.57–1.00)
Globulin, Total: 2.6 g/dL (ref 1.5–4.5)
Glucose: 113 mg/dL — ABNORMAL HIGH (ref 65–99)
Potassium: 4.8 mmol/L (ref 3.5–5.2)
Sodium: 144 mmol/L (ref 134–144)
Total Protein: 6.6 g/dL (ref 6.0–8.5)
eGFR: 103 mL/min/{1.73_m2} (ref >59)

## 2021-03-06 LAB — LIPID PANEL
Chol/HDL Ratio: 4 ratio (ref 0.0–4.4)
Cholesterol, Total: 208 mg/dL — ABNORMAL HIGH (ref 100–199)
HDL: 52 mg/dL (ref >39)
LDL Chol Calc (NIH): 132 mg/dL — ABNORMAL HIGH (ref 0–99)
Triglycerides: 132 mg/dL (ref 0–149)
VLDL Cholesterol Cal: 24 mg/dL (ref 5–40)

## 2021-03-06 LAB — CARDIOVASCULAR RISK ASSESSMENT

## 2021-03-06 LAB — MAGNESIUM: Magnesium: 2.2 mg/dL (ref 1.6–2.3)

## 2021-03-06 LAB — TSH: TSH: 5.21 u[IU]/mL — ABNORMAL HIGH (ref 0.450–4.500)

## 2021-03-06 MED ORDER — LEVOTHYROXINE SODIUM 125 MCG PO TABS: 125.0000 ug | ORAL_TABLET | Freq: Every day | ORAL | 2 refills | Status: DC

## 2021-04-01 DIAGNOSIS — N8110 Cystocele, unspecified: Secondary | ICD-10-CM | POA: Insufficient documentation

## 2021-04-01 DIAGNOSIS — N921 Excessive and frequent menstruation with irregular cycle: Secondary | ICD-10-CM

## 2021-04-01 HISTORY — DX: Excessive and frequent menstruation with irregular cycle: N92.1

## 2021-04-01 HISTORY — DX: Cystocele, unspecified: N81.10

## 2021-05-06 DIAGNOSIS — N939 Abnormal uterine and vaginal bleeding, unspecified: Secondary | ICD-10-CM | POA: Diagnosis not present

## 2021-05-06 DIAGNOSIS — Z803 Family history of malignant neoplasm of breast: Secondary | ICD-10-CM | POA: Diagnosis not present

## 2021-05-06 DIAGNOSIS — Z87891 Personal history of nicotine dependence: Secondary | ICD-10-CM | POA: Diagnosis not present

## 2021-05-06 DIAGNOSIS — N858 Other specified noninflammatory disorders of uterus: Secondary | ICD-10-CM | POA: Diagnosis not present

## 2021-05-06 DIAGNOSIS — N812 Incomplete uterovaginal prolapse: Secondary | ICD-10-CM | POA: Diagnosis not present

## 2021-05-06 DIAGNOSIS — N840 Polyp of corpus uteri: Secondary | ICD-10-CM | POA: Diagnosis not present

## 2021-05-06 DIAGNOSIS — J449 Chronic obstructive pulmonary disease, unspecified: Secondary | ICD-10-CM | POA: Diagnosis not present

## 2021-05-21 DIAGNOSIS — Z9889 Other specified postprocedural states: Secondary | ICD-10-CM

## 2021-05-21 HISTORY — DX: Other specified postprocedural states: Z98.890

## 2021-06-10 ENCOUNTER — Other Ambulatory Visit: Payer: Self-pay

## 2021-06-10 ENCOUNTER — Encounter: Payer: Self-pay | Admitting: Physician Assistant

## 2021-06-10 ENCOUNTER — Ambulatory Visit: Payer: BC Managed Care – PPO | Admitting: Physician Assistant

## 2021-06-10 VITALS — BP 118/76 | HR 95 | Temp 97.2°F | Ht 65.0 in | Wt 279.0 lb

## 2021-06-10 DIAGNOSIS — Z23 Encounter for immunization: Secondary | ICD-10-CM | POA: Diagnosis not present

## 2021-06-10 DIAGNOSIS — J4520 Mild intermittent asthma, uncomplicated: Secondary | ICD-10-CM

## 2021-06-10 DIAGNOSIS — F419 Anxiety disorder, unspecified: Secondary | ICD-10-CM | POA: Diagnosis not present

## 2021-06-10 DIAGNOSIS — F331 Major depressive disorder, recurrent, moderate: Secondary | ICD-10-CM

## 2021-06-10 DIAGNOSIS — E039 Hypothyroidism, unspecified: Secondary | ICD-10-CM | POA: Diagnosis not present

## 2021-06-10 DIAGNOSIS — R739 Hyperglycemia, unspecified: Secondary | ICD-10-CM

## 2021-06-10 DIAGNOSIS — E782 Mixed hyperlipidemia: Secondary | ICD-10-CM | POA: Diagnosis not present

## 2021-06-10 DIAGNOSIS — G43009 Migraine without aura, not intractable, without status migrainosus: Secondary | ICD-10-CM

## 2021-06-10 MED ORDER — LORAZEPAM 0.5 MG PO TABS: 0.5000 mg | ORAL_TABLET | Freq: Every day | ORAL | 1 refills | Status: DC | PRN

## 2021-06-10 MED ORDER — ELETRIPTAN HYDROBROMIDE 20 MG PO TABS
20.0000 mg | ORAL_TABLET | ORAL | 2 refills | Status: DC | PRN
Start: 2021-06-10 — End: 2021-09-16

## 2021-06-10 MED ORDER — ESCITALOPRAM OXALATE 20 MG PO TABS: 20.0000 mg | ORAL_TABLET | Freq: Every day | ORAL | 1 refills | Status: DC

## 2021-06-10 MED ORDER — ALBUTEROL SULFATE HFA 108 (90 BASE) MCG/ACT IN AERS: 2.0000 | INHALATION_SPRAY | Freq: Four times a day (QID) | RESPIRATORY_TRACT | 5 refills | Status: DC | PRN

## 2021-06-10 NOTE — Progress Notes (Signed)
Subjective:  Patient ID: Kristen Vazquez, female    DOB: 11/24/1965  Age: 55 y.o. MRN: [id number]  Chief Complaint  Patient presents with   Hypothyroidism    HPI  Pt with history of hypothyroidism - currently on synthroid - due for repeat labwork   Mixed hyperlipidemia  Pt presents with hyperlipidemia. - at last visit her chol and LDL were elevated  -she did not start medication but states she is trying to watch her diet - due to recheck labwork  Pt with history of anxiety - states symptoms are stable on lexapro and ativan - voices no problems or concerns  Pt with history of GERD - symptoms controlled with otc prilosec  Pt states that she used to have migraine headaches in the past - she usually takes excedrin which helps but lately that does not completely resolve headaches - would like to try another medication Describes headaches that can be on either side of head - at times causes nausea and photophobia - has to lay down / rest for them to resolve Current Outpatient Medications on File Prior to Visit  Medication Sig Dispense Refill   cyclobenzaprine (FLEXERIL) 5 MG tablet Take 1 tablet (5 mg total) by mouth 3 (three) times daily as needed for muscle spasms. 30 tablet 0   levothyroxine (SYNTHROID) 125 MCG tablet Take 1 tablet (125 mcg total) by mouth daily before breakfast. 30 tablet 2   meloxicam (MOBIC) 15 MG tablet Take 1 tablet (15 mg total) by mouth daily. 30 tablet 0   omeprazole (PRILOSEC) 20 MG capsule Take 20 mg by mouth daily.     No current facility-administered medications on file prior to visit.   Past Medical History:  Diagnosis Date   Asthma    COPD (chronic obstructive pulmonary disease) (HCC)    Depression    GERD (gastroesophageal reflux disease)    Kidney stones    Past Surgical History:  Procedure Laterality Date   HERNIA REPAIR  2014    Family History  Problem Relation Age of Onset   Breast cancer Mother    Leukemia Father    Heart  failure Brother    Alcohol abuse Brother    Social History   Socioeconomic History   Marital status: Widowed    Spouse name: Not on file   Number of children: 1   Years of education: Not on file   Highest education level: Not on file  Occupational History   Occupation: [employer]  Tobacco Use   Smoking status: Former    Types: Cigarettes    Quit date: 2001    Years since quitting: 21.8   Smokeless tobacco: Never  Vaping Use   Vaping Use: Never used  Substance and Sexual Activity   Alcohol use: Yes    Alcohol/week: 2.0 standard drinks    Types: 2 Glasses of wine per week    Comment: SOCIAL   Drug use: Never   Sexual activity: Not Currently  Other Topics Concern   Not on file  Social History Narrative   Not on file   Social Determinants of Health   Financial Resource Strain: Not on file  Food Insecurity: Not on file  Transportation Needs: Not on file  Physical Activity: Not on file  Stress: Not on file  Social Connections: Not on file    Review of Systems  CONSTITUTIONAL: Negative for chills, fatigue, fever, unintentional weight gain and unintentional weight loss.  E/N/T: Negative for ear  pain, nasal congestion and sore throat.  CARDIOVASCULAR: Negative for chest pain, dizziness, palpitations and pedal edema.  RESPIRATORY: Negative for recent cough and dyspnea.  GASTROINTESTINAL: Negative for abdominal pain, acid reflux symptoms, constipation, diarrhea, nausea and vomiting.  MSK: Negative for arthralgias and myalgias.  INTEGUMENTARY: Negative for rash.  NEUROLOGICAL: see HPI PSYCHIATRIC: Negative for sleep disturbance and to question depression screen.  Negative for depression, negative for anhedonia.      Objective:  BP 118/76 (BP Location: Left Arm, Patient Position: Sitting, Cuff Size: Normal)   Pulse 95   Temp (!) 97.2 F (36.2 C) (Temporal)   Ht 5\' 5"  (1.651 m)   Wt 279 lb (126.6 kg)   SpO2 96%   BMI 46.43 kg/m   BP/Weight 06/10/2021  03/05/2021 10/23/2020  Systolic BP 118 132 126  Diastolic BP 76 78 82  Wt. (Lbs) 279 284 267.8  BMI 46.43 47.26 44.56    Physical Exam PHYSICAL EXAM:   VS: BP 118/76 (BP Location: Left Arm, Patient Position: Sitting, Cuff Size: Normal)   Pulse 95   Temp (!) 97.2 F (36.2 C) (Temporal)   Ht 5\' 5"  (1.651 m)   Wt 279 lb (126.6 kg)   SpO2 96%   BMI 46.43 kg/m   GEN: Well nourished, well developed, in no acute distress  Cardiac: RRR; no murmurs, rubs, or gallops,no edema -  Respiratory:  normal respiratory rate and pattern with no distress - normal breath sounds with no rales, rhonchi, wheezes or rubs MS: no deformity or atrophy  Skin: warm and dry, no rash  Neuro:  Alert and Oriented x 3,  CN II-Xii grossly intact Psych: euthymic mood, appropriate affect and demeanor  Diabetic Foot Exam - Simple   No data filed      Lab Results  Component Value Date   WBC 10.7 03/05/2021   HGB 13.6 03/05/2021   HCT 42.4 03/05/2021   PLT 281 03/05/2021   GLUCOSE 113 (H) 03/05/2021   CHOL 208 (H) 03/05/2021   TRIG 132 03/05/2021   HDL 52 03/05/2021   LDLCALC 132 (H) 03/05/2021   ALT 27 03/05/2021   AST 19 03/05/2021   NA 144 03/05/2021   K 4.8 03/05/2021   CL 102 03/05/2021   CREATININE 0.70 03/05/2021   BUN 11 03/05/2021   CO2 27 03/05/2021   TSH 5.210 (H) 03/05/2021   HGBA1C 6.1 (H) 01/02/2020      Assessment & Plan:   Problem List Items Addressed This Visit       Respiratory   Mild intermittent asthma   Relevant Medications   albuterol (VENTOLIN HFA) 108 (90 Base) MCG/ACT inhaler     Endocrine   Acquired hypothyroidism - Primary   Relevant Orders   TSH Continue meds     Other   Major depressive disorder, recurrent episode, moderate (HCC)   Relevant Medications   escitalopram (LEXAPRO) 20 MG tablet   LORazepam (ATIVAN) 0.5 MG tablet Continue meds   Mixed hyperlipidemia   Relevant Orders   CBC with Differential/Platelet   Comprehensive metabolic panel    Lipid panel Watch diet   Anxiety   Relevant Medications   escitalopram (LEXAPRO) 20 MG tablet   LORazepam (ATIVAN) 0.5 MG tablet Continue meds   Other Visit Diagnoses     Hyperglycemia       Relevant Orders   CBC with Differential/Platelet   Comprehensive metabolic panel   Hemoglobin A1c   Need for Tdap vaccination  Relevant Orders   Tdap vaccine greater than or equal to 7yo IM   Migraine without aura and without status migrainosus, not intractable       Relevant Medications   escitalopram (LEXAPRO) 20 MG tablet   eletriptan (RELPAX) 20 MG tablet     .  Meds ordered this encounter  Medications   albuterol (VENTOLIN HFA) 108 (90 Base) MCG/ACT inhaler    Sig: Inhale 2 puffs into the lungs every 6 (six) hours as needed for wheezing or shortness of breath.    Dispense:  8 g    Refill:  5    Order Specific Question:   Supervising Provider    Answer:   COX, Aniceto Boss   escitalopram (LEXAPRO) 20 MG tablet    Sig: Take 1 tablet (20 mg total) by mouth daily.    Dispense:  90 tablet    Refill:  1    Order Specific Question:   Supervising Provider    Answer:   COX, Aniceto Boss   LORazepam (ATIVAN) 0.5 MG tablet    Sig: Take 1 tablet (0.5 mg total) by mouth daily as needed for anxiety.    Dispense:  30 tablet    Refill:  1    Order Specific Question:   Supervising Provider    Answer:   Corey Harold   eletriptan (RELPAX) 20 MG tablet    Sig: Take 1 tablet (20 mg total) by mouth as needed for migraine or headache. May repeat in 2 hours if headache persists or recurs.    Dispense:  10 tablet    Refill:  2    Order Specific Question:   Supervising Provider    AnswerCorey Harold    Orders Placed This Encounter  Procedures   Tdap vaccine greater than or equal to 7yo IM   CBC with Differential/Platelet   Comprehensive metabolic panel   Hemoglobin A1c   TSH   Lipid panel     Follow-up: Return in about 3 months (around 09/10/2021) for  chronic fasting follow up.  An After Visit Summary was printed and given to the patient.  Jettie Pagan [facility] [phone]

## 2021-06-11 LAB — CBC WITH DIFFERENTIAL/PLATELET
Basophils Absolute: 0.1 10*3/uL (ref 0.0–0.2)
Basos: 1 %
EOS (ABSOLUTE): 0.3 10*3/uL (ref 0.0–0.4)
Eos: 3 %
Hematocrit: 39.6 % (ref 34.0–46.6)
Hemoglobin: 12.2 g/dL (ref 11.1–15.9)
Immature Grans (Abs): 0.1 10*3/uL (ref 0.0–0.1)
Immature Granulocytes: 1 %
Lymphocytes Absolute: 3 10*3/uL (ref 0.7–3.1)
Lymphs: 29 %
MCH: 26.1 pg — ABNORMAL LOW (ref 26.6–33.0)
MCHC: 30.8 g/dL — ABNORMAL LOW (ref 31.5–35.7)
MCV: 85 fL (ref 79–97)
Monocytes Absolute: 0.8 10*3/uL (ref 0.1–0.9)
Monocytes: 8 %
Neutrophils Absolute: 6.2 10*3/uL (ref 1.4–7.0)
Neutrophils: 58 %
Platelets: 281 10*3/uL (ref 150–450)
RBC: 4.67 x10E6/uL (ref 3.77–5.28)
RDW: 13.3 % (ref 11.7–15.4)
WBC: 10.4 10*3/uL (ref 3.4–10.8)

## 2021-06-11 LAB — COMPREHENSIVE METABOLIC PANEL
ALT: 47 IU/L — ABNORMAL HIGH (ref 0–32)
AST: 45 IU/L — ABNORMAL HIGH (ref 0–40)
Albumin/Globulin Ratio: 1.7 (ref 1.2–2.2)
Albumin: 4.1 g/dL (ref 3.8–4.9)
Alkaline Phosphatase: 128 IU/L — ABNORMAL HIGH (ref 44–121)
BUN/Creatinine Ratio: 16 (ref 9–23)
BUN: 10 mg/dL (ref 6–24)
Bilirubin Total: 0.3 mg/dL (ref 0.0–1.2)
CO2: 28 mmol/L (ref 20–29)
Calcium: 9.2 mg/dL (ref 8.7–10.2)
Chloride: 99 mmol/L (ref 96–106)
Creatinine, Ser: 0.62 mg/dL (ref 0.57–1.00)
Globulin, Total: 2.4 g/dL (ref 1.5–4.5)
Glucose: 100 mg/dL — ABNORMAL HIGH (ref 70–99)
Potassium: 4.5 mmol/L (ref 3.5–5.2)
Sodium: 140 mmol/L (ref 134–144)
Total Protein: 6.5 g/dL (ref 6.0–8.5)
eGFR: 106 mL/min/{1.73_m2} (ref >59)

## 2021-06-11 LAB — LIPID PANEL
Chol/HDL Ratio: 4 ratio (ref 0.0–4.4)
Cholesterol, Total: 178 mg/dL (ref 100–199)
HDL: 45 mg/dL (ref >39)
LDL Chol Calc (NIH): 108 mg/dL — ABNORMAL HIGH (ref 0–99)
Triglycerides: 142 mg/dL (ref 0–149)
VLDL Cholesterol Cal: 25 mg/dL (ref 5–40)

## 2021-06-11 LAB — HEMOGLOBIN A1C
Est. average glucose Bld gHb Est-mCnc: 134 mg/dL
Hgb A1c MFr Bld: 6.3 % — ABNORMAL HIGH (ref 4.8–5.6)

## 2021-06-11 LAB — CARDIOVASCULAR RISK ASSESSMENT

## 2021-06-11 LAB — TSH: TSH: 1.87 u[IU]/mL (ref 0.450–4.500)

## 2021-06-19 ENCOUNTER — Other Ambulatory Visit: Payer: Self-pay

## 2021-06-19 DIAGNOSIS — E039 Hypothyroidism, unspecified: Secondary | ICD-10-CM

## 2021-06-19 MED ORDER — LEVOTHYROXINE SODIUM 125 MCG PO TABS: 125.0000 ug | ORAL_TABLET | Freq: Every day | ORAL | 1 refills | Status: DC

## 2021-08-13 ENCOUNTER — Telehealth: Payer: Self-pay

## 2021-08-13 NOTE — Telephone Encounter (Addendum)
Patient took at home covid test today it is positive. Her symptoms started Monday; cough, fever, congestion, sneezing, fatigue, body aches. Taking tylenol, robitussin DM, and alkaseltzer plus. No available appointments today. Awaiting provider advisement.   Terrill Mohr 08/13/21 9:36 AM  Patient unable to receive antiviral medications. Advised to continue OTC medications, hydration, and rest. If she becomes ShOB or symptoms significantly worsen to call clinic or proceed to ED. Pt VU. COVID precautions given to pt.   Lorita Officer, [place] 08/13/21 10:28 AM

## 2021-08-19 ENCOUNTER — Encounter: Payer: Self-pay | Admitting: Legal Medicine

## 2021-08-19 ENCOUNTER — Telehealth (INDEPENDENT_AMBULATORY_CARE_PROVIDER_SITE_OTHER): Payer: BC Managed Care – PPO | Admitting: Legal Medicine

## 2021-08-19 DIAGNOSIS — J019 Acute sinusitis, unspecified: Secondary | ICD-10-CM

## 2021-08-19 HISTORY — DX: Acute sinusitis, unspecified: J01.90

## 2021-08-19 MED ORDER — PREDNISONE 10 MG (21) PO TBPK: ORAL_TABLET | ORAL | 0 refills | Status: DC

## 2021-08-19 MED ORDER — AZITHROMYCIN 250 MG PO TABS: ORAL_TABLET | ORAL | 0 refills | Status: AC

## 2021-08-19 MED ORDER — BENZONATATE 100 MG PO CAPS: 100.0000 mg | ORAL_CAPSULE | Freq: Two times a day (BID) | ORAL | 2 refills | Status: DC | PRN

## 2021-08-19 NOTE — Progress Notes (Signed)
Virtual Visit via Video Note   This visit type was conducted due to national recommendations for restrictions regarding the COVID-19 Pandemic (e.g. social distancing) in an effort to limit this patient's exposure and mitigate transmission in our community.  Due to her co-morbid illnesses, this patient is at least at moderate risk for complications without adequate follow up.  This format is felt to be most appropriate for this patient at this time.  All issues noted in this document were discussed and addressed.  A limited physical exam was performed with this format.  A verbal consent was obtained for the virtual visit.   Date:  08/19/2021   ID:  Kristen Vazquez, DOB 04/19/1966, MRN [id number]  Patient Location: Home Provider Location: Office/Clinic  PCP:  Marge Duncans, PA-C   Evaluation Performed:  Follow-Up Visit  Chief Complaint:  continued symptoms after Covid  History of Present Illness:    Kristen Vazquez is a 56 y.o. female with covid last week, got better, now coughing. Up al night.  The patient does not have symptoms concerning for COVID-19 infection (fever, chills, cough, or new shortness of breath).    Past Medical History:  Diagnosis Date   Asthma    COPD (chronic obstructive pulmonary disease) (HCC)    Depression    GERD (gastroesophageal reflux disease)    Kidney stones     Past Surgical History:  Procedure Laterality Date   HERNIA REPAIR  2014    Family History  Problem Relation Age of Onset   Breast cancer Mother    Leukemia Father    Heart failure Brother    Alcohol abuse Brother     Social History   Socioeconomic History   Marital status: Widowed    Spouse name: Not on file   Number of children: 1   Years of education: Not on file   Highest education level: Not on file  Occupational History   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  Tobacco Use   Smoking status: Former    Types: Cigarettes    Quit date: 2001    Years since quitting: 22.0    Smokeless tobacco: Never  Vaping Use   Vaping Use: Never used  Substance and Sexual Activity   Alcohol use: Yes    Alcohol/week: 2.0 standard drinks    Types: 2 Glasses of wine per week    Comment: SOCIAL   Drug use: Never   Sexual activity: Not Currently  Other Topics Concern   Not on file  Social History Narrative   Not on file   Social Determinants of Health   Financial Resource Strain: Not on file  Food Insecurity: Not on file  Transportation Needs: Not on file  Physical Activity: Not on file  Stress: Not on file  Social Connections: Not on file  Intimate Partner Violence: Not on file    Outpatient Medications Prior to Visit  Medication Sig Dispense Refill   albuterol (VENTOLIN HFA) 108 (90 Base) MCG/ACT inhaler Inhale 2 puffs into the lungs every 6 (six) hours as needed for wheezing or shortness of breath. 8 g 5   cyclobenzaprine (FLEXERIL) 5 MG tablet Take 1 tablet (5 mg total) by mouth 3 (three) times daily as needed for muscle spasms. 30 tablet 0   eletriptan (RELPAX) 20 MG tablet Take 1 tablet (20 mg total) by mouth as needed for migraine or headache. May repeat in 2 hours if headache persists or recurs. 10 tablet 2   escitalopram (LEXAPRO) 20  MG tablet Take 1 tablet (20 mg total) by mouth daily. 90 tablet 1   levothyroxine (SYNTHROID) 125 MCG tablet Take 1 tablet (125 mcg total) by mouth daily before breakfast. 30 tablet 1   LORazepam (ATIVAN) 0.5 MG tablet Take 1 tablet (0.5 mg total) by mouth daily as needed for anxiety. 30 tablet 1   meloxicam (MOBIC) 15 MG tablet Take 1 tablet (15 mg total) by mouth daily. 30 tablet 0   omeprazole (PRILOSEC) 20 MG capsule Take 20 mg by mouth daily.     No facility-administered medications prior to visit.    Allergies:   Codeine and Sulfa antibiotics   Social History   Tobacco Use   Smoking status: Former    Types: Cigarettes    Quit date: 2001    Years since quitting: 22.0   Smokeless tobacco: Never  Vaping Use    Vaping Use: Never used  Substance Use Topics   Alcohol use: Yes    Alcohol/week: 2.0 standard drinks    Types: 2 Glasses of wine per week    Comment: SOCIAL   Drug use: Never     Review of Systems  Constitutional:  Positive for fever and malaise/fatigue. Negative for chills.  HENT:  Positive for congestion.   Respiratory:  Positive for cough.   Cardiovascular:  Negative for palpitations.  Musculoskeletal:  Positive for myalgias.  Neurological:  Positive for headaches.    Labs/Other Tests and Data Reviewed:    Recent Labs: 03/05/2021: Magnesium 2.2 06/10/2021: ALT 47; BUN 10; Creatinine, Ser 0.62; Hemoglobin 12.2; Platelets 281; Potassium 4.5; Sodium 140; TSH 1.870   Recent Lipid Panel Lab Results  Component Value Date/Time   CHOL 178 06/10/2021 09:16 AM   TRIG 142 06/10/2021 09:16 AM   HDL 45 06/10/2021 09:16 AM   CHOLHDL 4.0 06/10/2021 09:16 AM   LDLCALC 108 (H) 06/10/2021 09:16 AM    Wt Readings from Last 3 Encounters:  06/10/21 279 lb (126.6 kg)  03/05/21 284 lb (128.8 kg)  10/23/20 267 lb 12.8 oz (121.5 kg)     Objective:    Vital Signs:  BP 134/86    Pulse 85    SpO2 97%    Physical Exam reviewed  ASSESSMENT & PLAN:   Diagnoses and all orders for this visit: Acute non-recurrent sinusitis, unspecified location -     azithromycin (ZITHROMAX) 250 MG tablet; Take 2 tablets on day 1, then 1 tablet daily on days 2 through 5 -     predniSONE (STERAPRED UNI-PAK 21 TAB) 10 MG (21) TBPK tablet; Take 6ills first day , then 5 pills day 2 and then cut down one pill day until gone -     benzonatate (TESSALON) 100 MG capsule; Take 1 capsule (100 mg total) by mouth 2 (two) times daily as needed for cough. Treat sinus infection with z-pack, prednisone         COVID-19 Education: The signs and symptoms of COVID-19 were discussed with the patient and how to seek care for testing (follow up with PCP or arrange E-visit). The importance of social distancing was discussed  today.   I spent 67minutes dedicated to the care of this patient on the date of this encounter to include face-to-face time with the patient, as well as:   Follow Up:  In Person prn  Signed, Reinaldo Meeker, MD  08/19/2021 9:56 AM    [place]

## 2021-09-16 ENCOUNTER — Encounter: Payer: Self-pay | Admitting: Physician Assistant

## 2021-09-16 ENCOUNTER — Other Ambulatory Visit: Payer: Self-pay

## 2021-09-16 ENCOUNTER — Ambulatory Visit: Payer: BC Managed Care – PPO | Admitting: Physician Assistant

## 2021-09-16 VITALS — BP 116/70 | HR 82 | Temp 98.0°F | Ht 65.0 in | Wt 273.4 lb

## 2021-09-16 DIAGNOSIS — E039 Hypothyroidism, unspecified: Secondary | ICD-10-CM | POA: Diagnosis not present

## 2021-09-16 DIAGNOSIS — Z23 Encounter for immunization: Secondary | ICD-10-CM

## 2021-09-16 DIAGNOSIS — E782 Mixed hyperlipidemia: Secondary | ICD-10-CM | POA: Diagnosis not present

## 2021-09-16 DIAGNOSIS — R739 Hyperglycemia, unspecified: Secondary | ICD-10-CM

## 2021-09-16 DIAGNOSIS — G43009 Migraine without aura, not intractable, without status migrainosus: Secondary | ICD-10-CM

## 2021-09-16 DIAGNOSIS — F419 Anxiety disorder, unspecified: Secondary | ICD-10-CM

## 2021-09-16 MED ORDER — LORAZEPAM 0.5 MG PO TABS: 0.5000 mg | ORAL_TABLET | Freq: Every day | ORAL | 1 refills | Status: DC | PRN

## 2021-09-16 MED ORDER — ELETRIPTAN HYDROBROMIDE 20 MG PO TABS: 20.0000 mg | ORAL_TABLET | ORAL | 2 refills | Status: DC | PRN

## 2021-09-16 NOTE — Progress Notes (Signed)
Subjective:  Patient ID: Kristen Vazquez, female    DOB: 04/12/1966  Age: 56 y.o. MRN: [id number]  Chief Complaint  Patient presents with   Hypothyroidism   Hyperlipidemia    .sd.  Hyperlipidemia   Pt with history of hypothyroidism - currently on synthroid - due for repeat labwork   Mixed hyperlipidemia  Pt presents with hyperlipidemia. - at last visit her chol and LDL were elevated  -she did not start medication but states she is trying to watch her diet - due to recheck labwork  Pt with history of anxiety - states symptoms are stable on lexapro and ativan - voices no problems or concerns  Pt with history of GERD - symptoms controlled with otc prilosec  Pt with history of migraines - recently tried relpax and states medication is working well for her   Would like flu shot today Current Outpatient Medications on File Prior to Visit  Medication Sig Dispense Refill   albuterol (VENTOLIN HFA) 108 (90 Base) MCG/ACT inhaler Inhale 2 puffs into the lungs every 6 (six) hours as needed for wheezing or shortness of breath. 8 g 5   cyclobenzaprine (FLEXERIL) 5 MG tablet Take 1 tablet (5 mg total) by mouth 3 (three) times daily as needed for muscle spasms. 30 tablet 0   escitalopram (LEXAPRO) 20 MG tablet Take 1 tablet (20 mg total) by mouth daily. 90 tablet 1   levothyroxine (SYNTHROID) 125 MCG tablet Take 1 tablet (125 mcg total) by mouth daily before breakfast. 30 tablet 1   meloxicam (MOBIC) 15 MG tablet Take 1 tablet (15 mg total) by mouth daily. 30 tablet 0   omeprazole (PRILOSEC) 20 MG capsule Take 20 mg by mouth daily.     No current facility-administered medications on file prior to visit.   Past Medical History:  Diagnosis Date   Asthma    COPD (chronic obstructive pulmonary disease) (HCC)    Depression    GERD (gastroesophageal reflux disease)    Kidney stones    Past Surgical History:  Procedure Laterality Date   HERNIA REPAIR  2014    Family History   Problem Relation Age of Onset   Breast cancer Mother    Leukemia Father    Heart failure Brother    Alcohol abuse Brother    Social History   Socioeconomic History   Marital status: Widowed    Spouse name: Not on file   Number of children: 1   Years of education: Not on file   Highest education level: Not on file  Occupational History   Occupation: [employer]  Tobacco Use   Smoking status: Former    Types: Cigarettes    Quit date: 2001    Years since quitting: 22.0   Smokeless tobacco: Never  Vaping Use   Vaping Use: Never used  Substance and Sexual Activity   Alcohol use: Yes    Alcohol/week: 2.0 standard drinks    Types: 2 Glasses of wine per week    Comment: SOCIAL   Drug use: Never   Sexual activity: Not Currently  Other Topics Concern   Not on file  Social History Narrative   Not on file   Social Determinants of Health   Financial Resource Strain: Not on file  Food Insecurity: Not on file  Transportation Needs: Not on file  Physical Activity: Not on file  Stress: Not on file  Social Connections: Not on file  CONSTITUTIONAL: Negative for chills, fatigue, fever,  unintentional weight gain and unintentional weight loss.  E/N/T: Negative for ear pain, nasal congestion and sore throat.  CARDIOVASCULAR: Negative for chest pain, dizziness, palpitations and pedal edema.  RESPIRATORY: Negative for recent cough and dyspnea.  GASTROINTESTINAL: Negative for abdominal pain, acid reflux symptoms, constipation, diarrhea, nausea and vomiting.  MSK: Negative for arthralgias and myalgias.  INTEGUMENTARY: Negative for rash.  NEUROLOGICAL: Negative for dizziness and headaches.  PSYCHIATRIC: Negative for sleep disturbance and to question depression screen.  Negative for depression, negative for anhedonia.       Objective:  PHYSICAL EXAM:   VS: BP 116/70    Pulse 82    Temp 98 F (36.7 C)    Ht 5\' 5"  (1.651 m)    Wt 273 lb 6.4 oz (124 kg)    SpO2 97%    BMI 45.50  kg/m   GEN: Well nourished, well developed, in no acute distress  Cardiac: RRR; no murmurs, rubs, or gallops,no edema -  Respiratory:  normal respiratory rate and pattern with no distress - normal breath sounds with no rales, rhonchi, wheezes or rubs GI: normal bowel sounds, no masses or tenderness MS: no deformity or atrophy  Skin: warm and dry, no rash  Psych: euthymic mood, appropriate affect and demeanor   Lab Results  Component Value Date   WBC 10.4 06/10/2021   HGB 12.2 06/10/2021   HCT 39.6 06/10/2021   PLT 281 06/10/2021   GLUCOSE 100 (H) 06/10/2021   CHOL 178 06/10/2021   TRIG 142 06/10/2021   HDL 45 06/10/2021   LDLCALC 108 (H) 06/10/2021   ALT 47 (H) 06/10/2021   AST 45 (H) 06/10/2021   NA 140 06/10/2021   K 4.5 06/10/2021   CL 99 06/10/2021   CREATININE 0.62 06/10/2021   BUN 10 06/10/2021   CO2 28 06/10/2021   TSH 1.870 06/10/2021   HGBA1C 6.3 (H) 06/10/2021      Assessment & Plan:   Problem List Items Addressed This Visit       Respiratory   Mild intermittent asthma   Relevant Medications   albuterol (VENTOLIN HFA) 108 (90 Base) MCG/ACT inhaler     Endocrine   Acquired hypothyroidism - Primary   Relevant Orders   TSH Continue meds     Other   Major depressive disorder, recurrent episode, moderate (HCC)   Relevant Medications   escitalopram (LEXAPRO) 20 MG tablet   LORazepam (ATIVAN) 0.5 MG tablet Continue meds   Mixed hyperlipidemia   Relevant Orders   CBC with Differential/Platelet   Comprehensive metabolic panel   Lipid panel Watch diet   Anxiety   Relevant Medications   escitalopram (LEXAPRO) 20 MG tablet   LORazepam (ATIVAN) 0.5 MG tablet Continue meds   Other Visit Diagnoses     Hyperglycemia       Relevant Orders   CBC with Differential/Platelet   Comprehensive metabolic panel   Hemoglobin A1c            Migraine without aura and without status migrainosus, not intractable       Relevant Medications   escitalopram  (LEXAPRO) 20 MG tablet   eletriptan (RELPAX) 20 MG tablet     .  Meds ordered this encounter  Medications   eletriptan (RELPAX) 20 MG tablet    Sig: Take 1 tablet (20 mg total) by mouth as needed for migraine or headache. May repeat in 2 hours if headache persists or recurs.    Dispense:  10 tablet  Refill:  2    Order Specific Question:   Supervising Provider    Answer:   Corey HaroldOX, KIRSTEN [[id number]]   LORazepam (ATIVAN) 0.5 MG tablet    Sig: Take 1 tablet (0.5 mg total) by mouth daily as needed for anxiety.    Dispense:  30 tablet    Refill:  1    Order Specific Question:   Supervising Provider    AnswerCorey Harold:   COX, KIRSTEN [[id number]]    Orders Placed This Encounter  Procedures   Flu Vaccine MDCK QUAD PF   CBC with Differential/Platelet   Comprehensive metabolic panel   TSH   Lipid panel   Hemoglobin A1c     Follow-up: No follow-ups on file.  An After Visit Summary was printed and given to the patient.  Jettie PaganSARA R Zarinah Oviatt, PA-C [facility] [phone]

## 2021-09-17 ENCOUNTER — Other Ambulatory Visit: Payer: Self-pay | Admitting: Physician Assistant

## 2021-09-17 DIAGNOSIS — E039 Hypothyroidism, unspecified: Secondary | ICD-10-CM

## 2021-09-17 LAB — COMPREHENSIVE METABOLIC PANEL
ALT: 69 IU/L — ABNORMAL HIGH (ref 0–32)
AST: 62 IU/L — ABNORMAL HIGH (ref 0–40)
Albumin/Globulin Ratio: 1.7 (ref 1.2–2.2)
Albumin: 4.2 g/dL (ref 3.8–4.9)
Alkaline Phosphatase: 140 IU/L — ABNORMAL HIGH (ref 44–121)
BUN/Creatinine Ratio: 17 (ref 9–23)
BUN: 10 mg/dL (ref 6–24)
Bilirubin Total: 0.2 mg/dL (ref 0.0–1.2)
CO2: 28 mmol/L (ref 20–29)
Calcium: 9.1 mg/dL (ref 8.7–10.2)
Chloride: 100 mmol/L (ref 96–106)
Creatinine, Ser: 0.59 mg/dL (ref 0.57–1.00)
Globulin, Total: 2.5 g/dL (ref 1.5–4.5)
Glucose: 112 mg/dL — ABNORMAL HIGH (ref 70–99)
Potassium: 4.3 mmol/L (ref 3.5–5.2)
Sodium: 140 mmol/L (ref 134–144)
Total Protein: 6.7 g/dL (ref 6.0–8.5)
eGFR: 107 mL/min/{1.73_m2} (ref >59)

## 2021-09-17 LAB — CARDIOVASCULAR RISK ASSESSMENT

## 2021-09-17 LAB — LIPID PANEL
Chol/HDL Ratio: 4.9 ratio — ABNORMAL HIGH (ref 0.0–4.4)
Cholesterol, Total: 204 mg/dL — ABNORMAL HIGH (ref 100–199)
HDL: 42 mg/dL (ref >39)
LDL Chol Calc (NIH): 138 mg/dL — ABNORMAL HIGH (ref 0–99)
Triglycerides: 134 mg/dL (ref 0–149)
VLDL Cholesterol Cal: 24 mg/dL (ref 5–40)

## 2021-09-17 LAB — HEMOGLOBIN A1C
Est. average glucose Bld gHb Est-mCnc: 146 mg/dL
Hgb A1c MFr Bld: 6.7 % — ABNORMAL HIGH (ref 4.8–5.6)

## 2021-09-17 LAB — CBC WITH DIFFERENTIAL/PLATELET
Basophils Absolute: 0.1 10*3/uL (ref 0.0–0.2)
Basos: 1 %
EOS (ABSOLUTE): 0.5 10*3/uL — ABNORMAL HIGH (ref 0.0–0.4)
Eos: 6 %
Hematocrit: 42 % (ref 34.0–46.6)
Hemoglobin: 12.9 g/dL (ref 11.1–15.9)
Immature Grans (Abs): 0 10*3/uL (ref 0.0–0.1)
Immature Granulocytes: 1 %
Lymphocytes Absolute: 2.6 10*3/uL (ref 0.7–3.1)
Lymphs: 32 %
MCH: 25.5 pg — ABNORMAL LOW (ref 26.6–33.0)
MCHC: 30.7 g/dL — ABNORMAL LOW (ref 31.5–35.7)
MCV: 83 fL (ref 79–97)
Monocytes Absolute: 0.6 10*3/uL (ref 0.1–0.9)
Monocytes: 8 %
Neutrophils Absolute: 4.2 10*3/uL (ref 1.4–7.0)
Neutrophils: 52 %
Platelets: 273 10*3/uL (ref 150–450)
RBC: 5.06 x10E6/uL (ref 3.77–5.28)
RDW: 15.8 % — ABNORMAL HIGH (ref 11.7–15.4)
WBC: 8 10*3/uL (ref 3.4–10.8)

## 2021-09-17 LAB — TSH: TSH: 12.9 u[IU]/mL — ABNORMAL HIGH (ref 0.450–4.500)

## 2021-09-17 MED ORDER — LEVOTHYROXINE SODIUM 137 MCG PO TABS: 137.0000 ug | ORAL_TABLET | Freq: Every day | ORAL | 2 refills | Status: DC

## 2021-09-19 ENCOUNTER — Other Ambulatory Visit: Payer: Self-pay | Admitting: Physician Assistant

## 2021-09-19 DIAGNOSIS — E782 Mixed hyperlipidemia: Secondary | ICD-10-CM

## 2021-09-19 MED ORDER — ROSUVASTATIN CALCIUM 5 MG PO TABS: 5.0000 mg | ORAL_TABLET | Freq: Every day | ORAL | 3 refills | Status: DC

## 2021-11-12 ENCOUNTER — Other Ambulatory Visit: Payer: Self-pay

## 2021-11-12 ENCOUNTER — Other Ambulatory Visit: Payer: BC Managed Care – PPO

## 2021-11-12 DIAGNOSIS — E039 Hypothyroidism, unspecified: Secondary | ICD-10-CM | POA: Diagnosis not present

## 2021-11-13 LAB — TSH: TSH: 0.84 u[IU]/mL (ref 0.450–4.500)

## 2021-11-17 ENCOUNTER — Other Ambulatory Visit: Payer: Self-pay | Admitting: Physician Assistant

## 2021-11-17 MED ORDER — LEVOTHYROXINE SODIUM 137 MCG PO TABS: 137.0000 ug | ORAL_TABLET | Freq: Every day | ORAL | 2 refills | Status: DC

## 2021-12-17 ENCOUNTER — Ambulatory Visit: Payer: BC Managed Care – PPO | Admitting: Physician Assistant

## 2021-12-17 ENCOUNTER — Encounter: Payer: Self-pay | Admitting: Physician Assistant

## 2021-12-17 VITALS — BP 128/82 | HR 73 | Temp 98.2°F | Resp 16 | Ht 65.0 in | Wt 265.0 lb

## 2021-12-17 DIAGNOSIS — E782 Mixed hyperlipidemia: Secondary | ICD-10-CM

## 2021-12-17 DIAGNOSIS — G43009 Migraine without aura, not intractable, without status migrainosus: Secondary | ICD-10-CM | POA: Diagnosis not present

## 2021-12-17 DIAGNOSIS — E039 Hypothyroidism, unspecified: Secondary | ICD-10-CM | POA: Diagnosis not present

## 2021-12-17 DIAGNOSIS — Z1231 Encounter for screening mammogram for malignant neoplasm of breast: Secondary | ICD-10-CM | POA: Diagnosis not present

## 2021-12-17 DIAGNOSIS — F331 Major depressive disorder, recurrent, moderate: Secondary | ICD-10-CM

## 2021-12-17 DIAGNOSIS — K219 Gastro-esophageal reflux disease without esophagitis: Secondary | ICD-10-CM

## 2021-12-17 DIAGNOSIS — F419 Anxiety disorder, unspecified: Secondary | ICD-10-CM

## 2021-12-17 NOTE — Progress Notes (Signed)
? ?Subjective:  ?Patient ID: Kristen Vazquez, female    DOB: Jan 11, 1966  Age: 56 y.o. MRN: [id number] ? ?Chief Complaint  ?Patient presents with  ? Hypothyroidism  ? Migraine  ? ? ?HPI ? Pt with history of hypothyroidism - currently on synthroid qd - due for TSH ? ?Pt with history of hyperlipidemia - recently started crestor 5mg  qd - is tolerating medication well and trying to watch diet - has lost a few pounds since last visit ?Voices no concerns or problems ? ?Pt states she would like FMLA papers filled out for work for migraines.  She has a history of migraines over the course of several years intermittently and has noted that since January she has had few more than usual.  Usually has now 1-2 per month and relpax relieves her symptoms but cannot miss work - She states when she has one resting in dark room and taking relpax resolves ? ?Pt with history of depression with anxiety - stable on lexapro and ativan(which she only takes as needed) ? ?Pt with history of GERD - stable on current medications ?Current Outpatient Medications on File Prior to Visit  ?Medication Sig Dispense Refill  ? albuterol (VENTOLIN HFA) 108 (90 Base) MCG/ACT inhaler Inhale 2 puffs into the lungs every 6 (six) hours as needed for wheezing or shortness of breath. 8 g 5  ? cyclobenzaprine (FLEXERIL) 5 MG tablet Take 1 tablet (5 mg total) by mouth 3 (three) times daily as needed for muscle spasms. 30 tablet 0  ? eletriptan (RELPAX) 20 MG tablet Take 1 tablet (20 mg total) by mouth as needed for migraine or headache. May repeat in 2 hours if headache persists or recurs. 10 tablet 2  ? escitalopram (LEXAPRO) 20 MG tablet Take 1 tablet (20 mg total) by mouth daily. 90 tablet 1  ? levothyroxine (SYNTHROID) 137 MCG tablet Take 1 tablet (137 mcg total) by mouth daily before breakfast. 30 tablet 2  ? LORazepam (ATIVAN) 0.5 MG tablet Take 1 tablet (0.5 mg total) by mouth daily as needed for anxiety. 30 tablet 1  ? meloxicam (MOBIC) 15 MG  tablet Take 1 tablet (15 mg total) by mouth daily. 30 tablet 0  ? omeprazole (PRILOSEC) 20 MG capsule Take 20 mg by mouth daily.    ? rosuvastatin (CRESTOR) 5 MG tablet Take 1 tablet (5 mg total) by mouth daily. 30 tablet 3  ? ?No current facility-administered medications on file prior to visit.  ? ?Past Medical History:  ?Diagnosis Date  ? Asthma   ? COPD (chronic obstructive pulmonary disease) (HCC)   ? Depression   ? GERD (gastroesophageal reflux disease)   ? Kidney stones   ? ?Past Surgical History:  ?Procedure Laterality Date  ? HERNIA REPAIR  2014  ?  ?Family History  ?Problem Relation Age of Onset  ? Breast cancer Mother   ? Leukemia Father   ? Heart failure Brother   ? Alcohol abuse Brother   ? ?Social History  ? ?Socioeconomic History  ? Marital status: Widowed  ?  Spouse name: Not on file  ? Number of children: 1  ? Years of education: Not on file  ? Highest education level: Not on file  ?Occupational History  ? Occupation: [employer]  ?Tobacco Use  ? Smoking status: Former  ?  Types: Cigarettes  ?  Quit date: 2001  ?  Years since quitting: 22.3  ? Smokeless tobacco: Never  ?Vaping Use  ? Vaping  Use: Never used  ?Substance and Sexual Activity  ? Alcohol use: Yes  ?  Alcohol/week: 2.0 standard drinks  ?  Types: 2 Glasses of wine per week  ?  Comment: SOCIAL  ? Drug use: Never  ? Sexual activity: Not Currently  ?Other Topics Concern  ? Not on file  ?Social History Narrative  ? Not on file  ? ?Social Determinants of Health  ? ?Financial Resource Strain: Not on file  ?Food Insecurity: Not on file  ?Transportation Needs: Not on file  ?Physical Activity: Not on file  ?Stress: Not on file  ?Social Connections: Not on file  ? ? ?Review of Systems ?CONSTITUTIONAL: Negative for chills, fatigue, fever, unintentional weight gain and unintentional weight loss.  ?CARDIOVASCULAR: Negative for chest pain, dizziness, palpitations and pedal edema.  ?RESPIRATORY: Negative for recent cough and dyspnea.   ? ?NEUROLOGICAL:see HPI ?PSYCHIATRIC: Negative for sleep disturbance and to question depression screen.  Negative for depression, negative for anhedonia.  ?   ? ? ?Objective:  ?BP 128/82   Pulse 73   Temp 98.2 ?F (36.8 ?C)   Resp 16   Ht 5\' 5"  (1.651 m)   Wt 265 lb (120.2 kg)   SpO2 97%   BMI 44.10 kg/m?  ? ? ?  12/17/2021  ?  9:08 AM 09/16/2021  ? 10:12 AM 08/19/2021  ?  9:54 AM  ?BP/Weight  ?Systolic BP 128 116 134  ?Diastolic BP 82 70 86  ?Wt. (Lbs) 265 273.4   ?BMI 44.1 kg/m2 45.5 kg/m2   ? ? ?Physical Exam ?PHYSICAL EXAM:  ? ?VS: BP 128/82   Pulse 73   Temp 98.2 ?F (36.8 ?C)   Resp 16   Ht 5\' 5"  (1.651 m)   Wt 265 lb (120.2 kg)   SpO2 97%   BMI 44.10 kg/m?  ? ?GEN: Well nourished, well developed, in no acute distress  ? ?Cardiac: RRR; no murmurs, rubs, ?Respiratory:  normal respiratory rate and pattern with no distress - normal breath sounds with no rales, rhonchi, wheezes or rubs ? ?Skin: warm and dry, no rash  ?Neuro:  Alert and Oriented x 3, Strength and sensation are intact - CN II-Xii grossly intact ?Psych: euthymic mood, appropriate affect and demeanor ? ?Diabetic Foot Exam - Simple   ?No data filed ?  ?  ? ?Lab Results  ?Component Value Date  ? WBC 8.0 09/16/2021  ? HGB 12.9 09/16/2021  ? HCT 42.0 09/16/2021  ? PLT 273 09/16/2021  ? GLUCOSE 112 (H) 09/16/2021  ? CHOL 204 (H) 09/16/2021  ? TRIG 134 09/16/2021  ? HDL 42 09/16/2021  ? LDLCALC 138 (H) 09/16/2021  ? ALT 69 (H) 09/16/2021  ? AST 62 (H) 09/16/2021  ? NA 140 09/16/2021  ? K 4.3 09/16/2021  ? CL 100 09/16/2021  ? CREATININE 0.59 09/16/2021  ? BUN 10 09/16/2021  ? CO2 28 09/16/2021  ? TSH 0.840 11/12/2021  ? HGBA1C 6.7 (H) 09/16/2021  ? ? ? ? ?Assessment & Plan:  ? ?Problem List Items Addressed This Visit   ? ?  ? Endocrine  ? Acquired hypothyroidism  ? Relevant Orders  ? TSH ?Continue current meds  ?  ? Other  ? Mixed hyperlipidemia - Primary  ? Relevant Orders  ? Comprehensive metabolic panel  ? Lipid panel ?Continue to watch diet   ? ?Other Visit Diagnoses   ? ? Screening mammogram for breast cancer      ? Relevant Orders  ? MM DIGITAL  SCREENING BILATERAL ? ?GERD ?Continue current meds ? ?Depression with anxiety ?Continue current meds  ? ?  ?. ? ?No orders of the defined types were placed in this encounter. ? ? ?Orders Placed This Encounter  ?Procedures  ? MM DIGITAL SCREENING BILATERAL  ? TSH  ? Comprehensive metabolic panel  ? Lipid panel  ?  ? ?Follow-up: No follow-ups on file. ? ?An After Visit Summary was printed and given to the patient. ? ?SARA R Neve Branscomb, PA-C ?[facility] ?[phone]

## 2021-12-18 ENCOUNTER — Other Ambulatory Visit: Payer: Self-pay

## 2021-12-18 ENCOUNTER — Other Ambulatory Visit: Payer: Self-pay | Admitting: Physician Assistant

## 2021-12-18 DIAGNOSIS — E039 Hypothyroidism, unspecified: Secondary | ICD-10-CM

## 2021-12-18 LAB — COMPREHENSIVE METABOLIC PANEL
ALT: 40 IU/L — ABNORMAL HIGH (ref 0–32)
AST: 34 IU/L (ref 0–40)
Albumin/Globulin Ratio: 1.5 (ref 1.2–2.2)
Albumin: 4.3 g/dL (ref 3.8–4.9)
Alkaline Phosphatase: 133 IU/L — ABNORMAL HIGH (ref 44–121)
BUN/Creatinine Ratio: 20 (ref 9–23)
BUN: 13 mg/dL (ref 6–24)
Bilirubin Total: <0.2 mg/dL (ref 0.0–1.2)
CO2: 29 mmol/L (ref 20–29)
Calcium: 9.5 mg/dL (ref 8.7–10.2)
Chloride: 99 mmol/L (ref 96–106)
Creatinine, Ser: 0.66 mg/dL (ref 0.57–1.00)
Globulin, Total: 2.9 g/dL (ref 1.5–4.5)
Glucose: 116 mg/dL — ABNORMAL HIGH (ref 70–99)
Potassium: 4.6 mmol/L (ref 3.5–5.2)
Sodium: 140 mmol/L (ref 134–144)
Total Protein: 7.2 g/dL (ref 6.0–8.5)
eGFR: 104 mL/min/{1.73_m2} (ref >59)

## 2021-12-18 LAB — TSH: TSH: 0.415 u[IU]/mL — ABNORMAL LOW (ref 0.450–4.500)

## 2021-12-18 LAB — LIPID PANEL
Chol/HDL Ratio: 3.4 ratio (ref 0.0–4.4)
Cholesterol, Total: 163 mg/dL (ref 100–199)
HDL: 48 mg/dL (ref >39)
LDL Chol Calc (NIH): 91 mg/dL (ref 0–99)
Triglycerides: 139 mg/dL (ref 0–149)
VLDL Cholesterol Cal: 24 mg/dL (ref 5–40)

## 2021-12-18 LAB — CARDIOVASCULAR RISK ASSESSMENT

## 2021-12-18 MED ORDER — LEVOTHYROXINE SODIUM 125 MCG PO TABS: 125.0000 ug | ORAL_TABLET | Freq: Every day | ORAL | 2 refills | Status: DC

## 2021-12-19 ENCOUNTER — Other Ambulatory Visit: Payer: Self-pay | Admitting: Physician Assistant

## 2021-12-19 MED ORDER — ROSUVASTATIN CALCIUM 10 MG PO TABS: 10.0000 mg | ORAL_TABLET | Freq: Every day | ORAL | 1 refills | Status: DC

## 2022-01-05 ENCOUNTER — Telehealth: Payer: Self-pay

## 2022-01-05 NOTE — Telephone Encounter (Signed)
Patient left message at 15:37 inquiring about some paperwork that she needed, stated today was/is the deadline for it to be turned into the "[employer] place"??. Attempted to contact patient, no answer but left a message for her to call our office.

## 2022-01-06 ENCOUNTER — Telehealth: Payer: Self-pay

## 2022-01-06 NOTE — Telephone Encounter (Signed)
Kristen Vazquez called with concerns about her FMLA paperwork.  The paperwork was faxed on May 8 with confirmation but the paperwork was not received.  The paperwork will be resent.

## 2022-01-06 NOTE — Telephone Encounter (Signed)
Patient called back stating that question 6 or question 1 was not filled out on her Kristen Vazquez form. However, it is answered. I was unable to contact Kristen Vazquez to see what they are talking about. I spoke with the patient who is going to reach out to them to have them contact our office. waiting for a phone call from [place].

## 2022-01-07 ENCOUNTER — Other Ambulatory Visit: Payer: Self-pay | Admitting: Physician Assistant

## 2022-01-07 ENCOUNTER — Ambulatory Visit
Admission: RE | Admit: 2022-01-07 | Discharge: 2022-01-07 | Disposition: A | Discharge status: Home / Self Care | Payer: BC Managed Care – PPO | Source: Ambulatory Visit | Attending: Physician Assistant | Admitting: Physician Assistant

## 2022-01-07 DIAGNOSIS — Z1231 Encounter for screening mammogram for malignant neoplasm of breast: Secondary | ICD-10-CM | POA: Diagnosis not present

## 2022-02-11 ENCOUNTER — Other Ambulatory Visit: Payer: BC Managed Care – PPO

## 2022-02-11 DIAGNOSIS — E039 Hypothyroidism, unspecified: Secondary | ICD-10-CM | POA: Diagnosis not present

## 2022-02-12 LAB — TSH: TSH: 0.646 u[IU]/mL (ref 0.450–4.500)

## 2022-02-24 ENCOUNTER — Ambulatory Visit: Payer: BC Managed Care – PPO | Admitting: Physician Assistant

## 2022-02-24 ENCOUNTER — Encounter: Payer: Self-pay | Admitting: Physician Assistant

## 2022-02-24 VITALS — BP 126/72 | HR 93 | Temp 97.0°F | Ht 65.0 in | Wt 264.0 lb

## 2022-02-24 DIAGNOSIS — R109 Unspecified abdominal pain: Secondary | ICD-10-CM | POA: Diagnosis not present

## 2022-02-24 DIAGNOSIS — R1031 Right lower quadrant pain: Secondary | ICD-10-CM

## 2022-02-24 NOTE — Progress Notes (Signed)
Acute Office Visit  Subjective:    Patient ID: Kristen Vazquez, female    DOB: 1966/03/28, 56 y.o.   MRN: [id number]  Chief Complaint  Patient presents with   Abdominal Pain    Mid Right side    HPI: Patient is in today for complaints of right side abdominal pain for the past week - states it has progressively gotten slightly worse Says that she feels pain in mid abdomen on right side with radiation to her back Denies nausea/vomiting - appetite has been normal - denies urine symptoms - she denies hemaotchezia but had dark bowels noted after eating a pound of blackberries last week but normal since that time  Past Medical History:  Diagnosis Date   Asthma    COPD (chronic obstructive pulmonary disease) (HCC)    Depression    GERD (gastroesophageal reflux disease)    Kidney stones     Past Surgical History:  Procedure Laterality Date   HERNIA REPAIR  2014    Family History  Problem Relation Age of Onset   Breast cancer Mother    Leukemia Father    Heart failure Brother    Alcohol abuse Brother     Social History   Socioeconomic History   Marital status: Widowed    Spouse name: Not on file   Number of children: 1   Years of education: Not on file   Highest education level: Not on file  Occupational History   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[place]  Tobacco Use   Smoking status: Former    Types: Cigarettes    Quit date: 2001    Years since quitting: 22.5   Smokeless tobacco: Never  Vaping Use   Vaping Use: Never used  Substance and Sexual Activity   Alcohol use: Yes    Alcohol/week: 2.0 standard drinks of alcohol    Types: 2 Glasses of wine per week    Comment: SOCIAL   Drug use: Never   Sexual activity: Not Currently  Other Topics Concern   Not on file  Social History Narrative   Not on file   Social Determinants of Health   Financial Resource Strain: Not on file  Food Insecurity: Not on file  Transportation Needs: Not on file  Physical Activity: Not  on file  Stress: Not on file  Social Connections: Not on file  Intimate Partner Violence: Not on file    Outpatient Medications Prior to Visit  Medication Sig Dispense Refill   albuterol (VENTOLIN HFA) 108 (90 Base) MCG/ACT inhaler Inhale 2 puffs into the lungs every 6 (six) hours as needed for wheezing or shortness of breath. 8 g 5   cyclobenzaprine (FLEXERIL) 5 MG tablet Take 1 tablet (5 mg total) by mouth 3 (three) times daily as needed for muscle spasms. 30 tablet 0   eletriptan (RELPAX) 20 MG tablet Take 1 tablet (20 mg total) by mouth as needed for migraine or headache. May repeat in 2 hours if headache persists or recurs. 10 tablet 2   escitalopram (LEXAPRO) 20 MG tablet Take 1 tablet (20 mg total) by mouth daily. 90 tablet 1   levothyroxine (SYNTHROID) 125 MCG tablet Take 1 tablet (125 mcg total) by mouth daily. 30 tablet 2   LORazepam (ATIVAN) 0.5 MG tablet Take 1 tablet (0.5 mg total) by mouth daily as needed for anxiety. 30 tablet 1   meloxicam (MOBIC) 15 MG tablet Take 1 tablet (15 mg total) by mouth daily. 30 tablet 0  omeprazole (PRILOSEC) 20 MG capsule Take 20 mg by mouth daily.     rosuvastatin (CRESTOR) 10 MG tablet Take 1 tablet (10 mg total) by mouth daily. 90 tablet 1   No facility-administered medications prior to visit.    Allergies  Allergen Reactions   Codeine    Sulfa Antibiotics Rash    Review of Systems CONSTITUTIONAL: Negative for chills, fatigue, fever,  E/N/T: Negative for ear pain, nasal congestion and sore throat.  CARDIOVASCULAR: Negative for chest pain, dizziness,  RESPIRATORY: Negative for recent cough and dyspnea.  GASTROINTESTINAL: see HPI GU - negative  INTEGUMENTARY: Negative for rash.       Objective:  PHYSICAL EXAM:   VS: BP 126/72 (BP Location: Left Arm, Patient Position: Sitting, Cuff Size: Large)   Pulse 93   Temp (!) 97 F (36.1 C) (Temporal)   Ht 5' 5"  (1.651 m)   Wt 264 lb (119.7 kg)   SpO2 97%   BMI 43.93 kg/m   GEN:  Well nourished, well developed, in no acute distress   Cardiac: RRR; no murmurs, rubs, or gallops,no edema -  Respiratory:  normal respiratory rate and pattern with no distress - normal breath sounds with no rales, rhonchi, wheezes or rubs GI: decreased bowel sounds - abdominal tenderness to palpation with guarding and rebound tenderness noted on right side  Skin: warm and dry, no rash   Psych: euthymic mood, appropriate affect and demeanor   Lab Results  Component Value Date   TSH 0.646 02/11/2022   Lab Results  Component Value Date   WBC 8.0 09/16/2021   HGB 12.9 09/16/2021   HCT 42.0 09/16/2021   MCV 83 09/16/2021   PLT 273 09/16/2021   Lab Results  Component Value Date   NA 140 12/17/2021   K 4.6 12/17/2021   CO2 29 12/17/2021   GLUCOSE 116 (H) 12/17/2021   BUN 13 12/17/2021   CREATININE 0.66 12/17/2021   BILITOT <0.2 12/17/2021   ALKPHOS 133 (H) 12/17/2021   AST 34 12/17/2021   ALT 40 (H) 12/17/2021   PROT 7.2 12/17/2021   ALBUMIN 4.3 12/17/2021   CALCIUM 9.5 12/17/2021   EGFR 104 12/17/2021   Lab Results  Component Value Date   CHOL 163 12/17/2021   Lab Results  Component Value Date   HDL 48 12/17/2021   Lab Results  Component Value Date   LDLCALC 91 12/17/2021   Lab Results  Component Value Date   TRIG 139 12/17/2021   Lab Results  Component Value Date   CHOLHDL 3.4 12/17/2021   Lab Results  Component Value Date   HGBA1C 6.7 (H) 09/16/2021       Assessment & Plan:   Problem List Items Addressed This Visit   None Visit Diagnoses     Right lower quadrant abdominal pain    -  Primary   Relevant Orders   CT Abdomen Pelvis W Contrast   CBC with Differential/Platelet   Comprehensive metabolic panel      No orders of the defined types were placed in this encounter.   Orders Placed This Encounter  Procedures   CT Abdomen Pelvis W Contrast   CBC with Differential/Platelet   Comprehensive metabolic panel     Follow-up: Return if  symptoms worsen or fail to improve. - will follow up according to CT results  An After Visit Summary was printed and given to the patient.  Yetta Flock [facility] [phone]

## 2022-02-25 DIAGNOSIS — K6389 Other specified diseases of intestine: Secondary | ICD-10-CM | POA: Diagnosis not present

## 2022-03-04 ENCOUNTER — Other Ambulatory Visit: Payer: Self-pay

## 2022-03-04 DIAGNOSIS — E039 Hypothyroidism, unspecified: Secondary | ICD-10-CM

## 2022-03-04 MED ORDER — LEVOTHYROXINE SODIUM 125 MCG PO TABS: 125.0000 ug | ORAL_TABLET | Freq: Every day | ORAL | 0 refills | Status: DC

## 2022-03-05 ENCOUNTER — Other Ambulatory Visit: Payer: Self-pay | Admitting: Physician Assistant

## 2022-03-05 DIAGNOSIS — E039 Hypothyroidism, unspecified: Secondary | ICD-10-CM

## 2022-03-10 DIAGNOSIS — K6389 Other specified diseases of intestine: Secondary | ICD-10-CM | POA: Diagnosis not present

## 2022-03-24 ENCOUNTER — Ambulatory Visit: Payer: BC Managed Care – PPO | Admitting: Physician Assistant

## 2022-03-29 ENCOUNTER — Other Ambulatory Visit: Payer: Self-pay | Admitting: Physician Assistant

## 2022-03-29 DIAGNOSIS — F419 Anxiety disorder, unspecified: Secondary | ICD-10-CM

## 2022-03-31 ENCOUNTER — Encounter: Payer: Self-pay | Admitting: Physician Assistant

## 2022-03-31 ENCOUNTER — Ambulatory Visit (INDEPENDENT_AMBULATORY_CARE_PROVIDER_SITE_OTHER): Payer: BC Managed Care – PPO | Admitting: Physician Assistant

## 2022-03-31 VITALS — BP 128/76 | HR 81 | Temp 97.0°F | Ht 65.0 in | Wt 266.8 lb

## 2022-03-31 DIAGNOSIS — Z0000 Encounter for general adult medical examination without abnormal findings: Secondary | ICD-10-CM

## 2022-03-31 DIAGNOSIS — Z23 Encounter for immunization: Secondary | ICD-10-CM

## 2022-03-31 LAB — POCT URINALYSIS DIP (CLINITEK)
Bilirubin, UA: NEGATIVE
Blood, UA: NEGATIVE
Glucose, UA: NEGATIVE mg/dL
Ketones, POC UA: NEGATIVE mg/dL
Leukocytes, UA: NEGATIVE
Nitrite, UA: NEGATIVE
POC PROTEIN,UA: NEGATIVE
Spec Grav, UA: 1.015 (ref 1.010–1.025)
Urobilinogen, UA: 0.2 E.U./dL
pH, UA: 6.5 (ref 5.0–8.0)

## 2022-03-31 NOTE — Progress Notes (Signed)
Subjective:  Patient ID: Kristen Vazquez, female    DOB: Dec 25, 1965  Age: 56 y.o. MRN: [id number]  Chief Complaint  Patient presents with   Annual Exam    HPI Well Adult Physical: Patient here for a comprehensive physical exam.The patient reports no problems Do you take any herbs or supplements that were not prescribed by a doctor? no Are you taking calcium supplements? no Are you taking aspirin daily? no  Encounter for general adult medical examination without abnormal findings  Physical ("At Risk" items are starred): Patient's last physical exam was 1 year ago .  Patient is not afflicted from Stress Incontinence and Urge Incontinence  Patient wears a seat belt, has smoke detectors, has carbon monoxide detectors, , and wears sunscreen with extended sun exposure. Dental Care: biannual cleanings, brushes and flosses daily. 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[place]  Tobacco Use   Smoking status: Former    Types: Cigarettes    Quit date: 2001    Years since quitting: 22.6   Smokeless tobacco: Never  Vaping Use   Vaping Use: Never used  Substance and Sexual Activity   Alcohol use: Yes    Alcohol/week: 2.0 standard drinks of alcohol    Types: 2 Glasses of wine per week    Comment: SOCIAL   Drug use: Never   Sexual activity: Not Currently  Other Topics Concern   Not on file  Social History Narrative   Not on file   Social  Determinants of Health   Financial Resource Strain: Not on file  Food Insecurity: Not on file  Transportation Needs: Not on file  Physical Activity: Not on file  Stress: Not on file  Social Connections: Not on file   Past Medical History:  Diagnosis Date   Asthma    COPD (chronic obstructive pulmonary disease) (HCC)    Depression    GERD (gastroesophageal reflux disease)    Kidney stones    Past Surgical History:  Procedure Laterality Date   HERNIA REPAIR  2014    Family History  Problem Relation Age of Onset   Breast cancer Mother    Leukemia Father    Heart failure Brother    Alcohol abuse Brother     Review of Systems CONSTITUTIONAL: Negative for chills, fatigue, fever, unintentional weight gain and unintentional weight loss.  E/N/T: Negative for ear pain, nasal congestion and sore throat.  CARDIOVASCULAR: Negative for chest pain, dizziness, palpitations and pedal edema.  RESPIRATORY: Negative for recent cough and dyspnea.  GASTROINTESTINAL: Negative for abdominal pain, acid reflux symptoms, constipation, diarrhea, nausea and vomiting.  MSK: Negative for arthralgias and myalgias.  INTEGUMENTARY: Negative for rash.  NEUROLOGICAL: Negative for dizziness and headaches.  PSYCHIATRIC: Negative for sleep disturbance and to question depression screen.  Negative for depression, negative for anhedonia.  Objective:  PHYSICAL EXAM:   VS: BP 128/76 (BP Location: Left Arm, Patient Position: Sitting, Cuff Size: Large)   Pulse 81   Temp (!) 97 F (36.1 C) (Temporal)   Ht 5\' 5"  (1.651 m)   Wt 266 lb 12.8 oz (121 kg)   SpO2 98%   BMI 44.40 kg/m   GEN: Well nourished, well developed, in no acute distress  HEENT: normal external ears and nose - normal external auditory canals and TMS - hearing grossly normal - normal nasal mucosa and septum - Lips, Teeth and Gums - normal  Oropharynx - normal mucosa, palate, and posterior pharynx Neck: no JVD or masses - no  thyromegaly Cardiac: RRR; no murmurs, rubs, or gallops,no edema - no significant varicosities Respiratory:  normal respiratory rate and pattern with no distress - normal breath sounds with no rales, rhonchi, wheezes or rubs GI: normal bowel sounds, no masses or tenderness MS: no deformity or atrophy  Skin: warm and dry, no rash  Neuro:  Alert and Oriented x 3, Strength and sensation are intact - CN II-Xii grossly intact Psych: euthymic mood, appropriate affect and demeanor  Office Visit on 03/31/2022  Component Date Value Ref Range Status   Color, UA 03/31/2022 yellow  yellow Final   Clarity, UA 03/31/2022 clear  clear Final   Glucose, UA 03/31/2022 negative  negative mg/dL Final   Bilirubin, UA 04/02/2022 negative  negative Final   Ketones, POC UA 03/31/2022 negative  negative mg/dL Final   Spec Grav, UA 04/02/2022 1.015  1.010 - 1.025 Final   Blood, UA 03/31/2022 negative  negative Final   pH, UA 03/31/2022 6.5  5.0 - 8.0 Final   POC PROTEIN,UA 03/31/2022 negative  negative, trace Final   Urobilinogen, UA 03/31/2022 0.2  0.2 or 1.0 E.U./dL Final   Nitrite, UA 04/02/2022 Negative  Negative Final   Leukocytes, UA 03/31/2022 Negative  Negative Final    Lab Results  Component Value Date   WBC 8.0 09/16/2021   HGB 12.9 09/16/2021   HCT 42.0 09/16/2021   PLT 273 09/16/2021   GLUCOSE 116 (H) 12/17/2021   CHOL 163 12/17/2021   TRIG 139 12/17/2021   HDL 48 12/17/2021   LDLCALC 91 12/17/2021   ALT 40 (H) 12/17/2021   AST 34 12/17/2021   NA 140 12/17/2021   K 4.6 12/17/2021   CL 99 12/17/2021   CREATININE 0.66 12/17/2021   BUN 13 12/17/2021   CO2 29 12/17/2021   TSH 0.646 02/11/2022   HGBA1C 6.7 (H) 09/16/2021      Assessment & Plan:   Problem List Items Addressed This Visit   None Visit Diagnoses     Routine physical examination    -  Primary   Relevant Orders   POCT URINALYSIS DIP (CLINITEK) (Completed)   CBC with Differential/Platelet   Comprehensive metabolic  panel   Lipid panel   Hemoglobin A1c   Need for vaccination for zoster       Relevant Orders   Varicella-zoster vaccine IM (Completed)         Body mass index is 44.4 kg/m.   These are the goals we discussed:  Goals   None      This is a list of the screening recommended for you and due dates:  Health Maintenance  Topic Date Due   Flu Shot  03/17/2022   Zoster (Shingles) Vaccine (2 of 2) 05/26/2022   Mammogram  01/08/2023   Cologuard (Stool DNA test)  02/13/2023  Pap Smear  04/01/2024   Tetanus Vaccine  06/11/2031   HPV Vaccine  Aged Out   COVID-19 Vaccine  Discontinued   Hepatitis C Screening: USPSTF Recommendation to screen - Ages 103-79 yo.  Discontinued   HIV Screening  Discontinued     No orders of the defined types were placed in this encounter.   Follow-up: Return in about 3 months (around 07/01/2022) for follow up.  An After Visit Summary was printed and given to the patient.  Jettie Pagan [facility] [phone]

## 2022-04-01 LAB — LIPID PANEL
Chol/HDL Ratio: 3.2 ratio (ref 0.0–4.4)
Cholesterol, Total: 152 mg/dL (ref 100–199)
HDL: 48 mg/dL (ref >39)
LDL Chol Calc (NIH): 84 mg/dL (ref 0–99)
Triglycerides: 113 mg/dL (ref 0–149)
VLDL Cholesterol Cal: 20 mg/dL (ref 5–40)

## 2022-04-01 LAB — COMPREHENSIVE METABOLIC PANEL
ALT: 27 IU/L (ref 0–32)
AST: 24 IU/L (ref 0–40)
Albumin/Globulin Ratio: 1.8 (ref 1.2–2.2)
Albumin: 4.2 g/dL (ref 3.8–4.9)
Alkaline Phosphatase: 133 IU/L — ABNORMAL HIGH (ref 44–121)
BUN/Creatinine Ratio: 21 (ref 9–23)
BUN: 12 mg/dL (ref 6–24)
Bilirubin Total: 0.2 mg/dL (ref 0.0–1.2)
CO2: 25 mmol/L (ref 20–29)
Calcium: 9.3 mg/dL (ref 8.7–10.2)
Chloride: 102 mmol/L (ref 96–106)
Creatinine, Ser: 0.57 mg/dL (ref 0.57–1.00)
Globulin, Total: 2.4 g/dL (ref 1.5–4.5)
Glucose: 111 mg/dL — ABNORMAL HIGH (ref 70–99)
Potassium: 4.4 mmol/L (ref 3.5–5.2)
Sodium: 141 mmol/L (ref 134–144)
Total Protein: 6.6 g/dL (ref 6.0–8.5)
eGFR: 107 mL/min/{1.73_m2} (ref >59)

## 2022-04-01 LAB — CBC WITH DIFFERENTIAL/PLATELET
Basophils Absolute: 0.1 10*3/uL (ref 0.0–0.2)
Basos: 1 %
EOS (ABSOLUTE): 0.4 10*3/uL (ref 0.0–0.4)
Eos: 4 %
Hematocrit: 40.6 % (ref 34.0–46.6)
Hemoglobin: 12.9 g/dL (ref 11.1–15.9)
Immature Grans (Abs): 0 10*3/uL (ref 0.0–0.1)
Immature Granulocytes: 0 %
Lymphocytes Absolute: 2.7 10*3/uL (ref 0.7–3.1)
Lymphs: 33 %
MCH: 26.1 pg — ABNORMAL LOW (ref 26.6–33.0)
MCHC: 31.8 g/dL (ref 31.5–35.7)
MCV: 82 fL (ref 79–97)
Monocytes Absolute: 0.5 10*3/uL (ref 0.1–0.9)
Monocytes: 7 %
Neutrophils Absolute: 4.5 10*3/uL (ref 1.4–7.0)
Neutrophils: 55 %
Platelets: 245 10*3/uL (ref 150–450)
RBC: 4.95 x10E6/uL (ref 3.77–5.28)
RDW: 14.2 % (ref 11.7–15.4)
WBC: 8.1 10*3/uL (ref 3.4–10.8)

## 2022-04-01 LAB — HEMOGLOBIN A1C
Est. average glucose Bld gHb Est-mCnc: 143 mg/dL
Hgb A1c MFr Bld: 6.6 % — ABNORMAL HIGH (ref 4.8–5.6)

## 2022-04-01 LAB — CARDIOVASCULAR RISK ASSESSMENT

## 2022-04-22 ENCOUNTER — Other Ambulatory Visit: Payer: Self-pay | Admitting: Physician Assistant

## 2022-04-22 DIAGNOSIS — F331 Major depressive disorder, recurrent, moderate: Secondary | ICD-10-CM

## 2022-04-25 ENCOUNTER — Other Ambulatory Visit: Payer: Self-pay | Admitting: Physician Assistant

## 2022-04-25 DIAGNOSIS — E039 Hypothyroidism, unspecified: Secondary | ICD-10-CM

## 2022-06-06 ENCOUNTER — Other Ambulatory Visit: Payer: Self-pay | Admitting: Physician Assistant

## 2022-07-07 ENCOUNTER — Encounter: Payer: Self-pay | Admitting: Physician Assistant

## 2022-07-07 ENCOUNTER — Ambulatory Visit (INDEPENDENT_AMBULATORY_CARE_PROVIDER_SITE_OTHER): Payer: BC Managed Care – PPO | Admitting: Physician Assistant

## 2022-07-07 VITALS — BP 120/70 | HR 83 | Temp 97.5°F | Ht 65.0 in | Wt 269.4 lb

## 2022-07-07 DIAGNOSIS — F419 Anxiety disorder, unspecified: Secondary | ICD-10-CM | POA: Diagnosis not present

## 2022-07-07 DIAGNOSIS — E782 Mixed hyperlipidemia: Secondary | ICD-10-CM | POA: Diagnosis not present

## 2022-07-07 DIAGNOSIS — E039 Hypothyroidism, unspecified: Secondary | ICD-10-CM

## 2022-07-07 DIAGNOSIS — K219 Gastro-esophageal reflux disease without esophagitis: Secondary | ICD-10-CM

## 2022-07-07 DIAGNOSIS — E1165 Type 2 diabetes mellitus with hyperglycemia: Secondary | ICD-10-CM | POA: Diagnosis not present

## 2022-07-07 DIAGNOSIS — Z23 Encounter for immunization: Secondary | ICD-10-CM | POA: Diagnosis not present

## 2022-07-07 NOTE — Progress Notes (Signed)
Subjective:  Patient ID: Kristen Vazquez, female    DOB: [birth year]  Age: 56 y.o. MRN: [id number]  Chief Complaint  Patient presents with   Hypothyroidism   Hyperlipidemia    HPI  Mixed hyperlipidemia  Pt presents with hyperlipidemia. Compliance with treatment has beengood The patient is compliant with medications, maintains a low cholesterol diet , follows up as directed , and maintains an exercise regimen . The patient denies experiencing any hypercholesterolemia related symptoms. Pt currently on crestor 10mg  qd  Pt with history of hypothyroidism - currently on synthroid 155mcg qd - due for labwork  Pt with history of GERD - stable on prilosec 20mg  qd  Pt with history of anxiety - she is doing well on lexapro 20mg  and has not had to use ativan hardly at all  Pt would like second zoster vaccine today  Current Outpatient Medications on File Prior to Visit  Medication Sig Dispense Refill   albuterol (VENTOLIN HFA) 108 (90 Base) MCG/ACT inhaler Inhale 2 puffs into the lungs every 6 (six) hours as needed for wheezing or shortness of breath. 8 g 5   cyclobenzaprine (FLEXERIL) 5 MG tablet Take 1 tablet (5 mg total) by mouth 3 (three) times daily as needed for muscle spasms. 30 tablet 0   eletriptan (RELPAX) 20 MG tablet Take 1 tablet (20 mg total) by mouth as needed for migraine or headache. May repeat in 2 hours if headache persists or recurs. 10 tablet 2   escitalopram (LEXAPRO) 20 MG tablet Take 1 tablet by mouth once daily 90 tablet 0   levothyroxine (SYNTHROID) 125 MCG tablet Take 1 tablet by mouth once daily 30 tablet 2   LORazepam (ATIVAN) 0.5 MG tablet TAKE 1 TABLET BY MOUTH ONCE DAILY AS NEEDED FOR ANXIETY 30 tablet 0   meloxicam (MOBIC) 15 MG tablet Take 1 tablet (15 mg total) by mouth daily. 30 tablet 0   omeprazole (PRILOSEC) 20 MG capsule Take 20 mg by mouth daily.     rosuvastatin (CRESTOR) 10 MG tablet Take 1 tablet by mouth once daily 90 tablet 0   No current  facility-administered medications on file prior to visit.   Past Medical History:  Diagnosis Date   Asthma    COPD (chronic obstructive pulmonary disease) (HCC)    Depression    GERD (gastroesophageal reflux disease)    Kidney stones    Past Surgical History:  Procedure Laterality Date   HERNIA REPAIR  2014    Family History  Problem Relation Age of Onset   Breast cancer Mother    Leukemia Father    Heart failure Brother    Alcohol abuse Brother    Social History   Socioeconomic History   Marital status: Widowed    Spouse name: Not on file   Number of children: 1   Years of education: Not on file   Highest education level: Not on file  Occupational History   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[place]  Tobacco Use   Smoking status: Former    Types: Cigarettes    Quit date: 2001    Years since quitting: 22.9   Smokeless tobacco: Never  Vaping Use   Vaping Use: Never used  Substance and Sexual Activity   Alcohol use: Yes    Alcohol/week: 2.0 standard drinks of alcohol    Types: 2 Glasses of wine per week    Comment: SOCIAL   Drug use: Never   Sexual activity: Not Currently  Other Topics Concern  Not on file  Social History Narrative   Not on file   Social Determinants of Health   Financial Resource Strain: Not on file  Food Insecurity: Not on file  Transportation Needs: Not on file  Physical Activity: Not on file  Stress: Not on file  Social Connections: Not on file    Review of Systems  CONSTITUTIONAL: Negative for chills, fatigue, fever, unintentional weight gain and unintentional weight loss.  E/N/T: Negative for ear pain, nasal congestion and sore throat.  CARDIOVASCULAR: Negative for chest pain, dizziness, palpitations and pedal edema.  RESPIRATORY: Negative for recent cough and dyspnea.  GASTROINTESTINAL: Negative for abdominal pain, acid reflux symptoms, constipation, diarrhea, nausea and vomiting.  MSK: Negative for arthralgias and myalgias.   INTEGUMENTARY: Negative for rash.  NEUROLOGICAL: Negative for dizziness and headaches.  PSYCHIATRIC: Negative for sleep disturbance and to question depression screen.  Negative for depression, negative for anhedonia.      Objective:  PHYSICAL EXAM:   VS: BP 120/70 (BP Location: Left Arm, Patient Position: Sitting, Cuff Size: Large)   Pulse 83   Temp (!) 97.5 F (36.4 C) (Temporal)   Ht 5\' 5"  (1.651 m)   Wt 269 lb 6.4 oz (122.2 kg)   SpO2 98%   BMI 44.83 kg/m   GEN: Well nourished, well developed, in no acute distress  Cardiac: RRR; no murmurs, rubs, or gallops,no edema -  Respiratory:  normal respiratory rate and pattern with no distress - normal breath sounds with no rales, rhonchi, wheezes or rubs MS: no deformity or atrophy  Skin: warm and dry, no rash  Neuro:  Alert and Oriented x 3, - CN II-Xii grossly intact Psych: euthymic mood, appropriate affect and demeanor   Lab Results  Component Value Date   WBC 8.1 03/31/2022   HGB 12.9 03/31/2022   HCT 40.6 03/31/2022   PLT 245 03/31/2022   GLUCOSE 111 (H) 03/31/2022   CHOL 152 03/31/2022   TRIG 113 03/31/2022   HDL 48 03/31/2022   LDLCALC 84 03/31/2022   ALT 27 03/31/2022   AST 24 03/31/2022   NA 141 03/31/2022   K 4.4 03/31/2022   CL 102 03/31/2022   CREATININE 0.57 03/31/2022   BUN 12 03/31/2022   CO2 25 03/31/2022   TSH 0.646 02/11/2022   HGBA1C 6.6 (H) 03/31/2022      Assessment & Plan:   Problem List Items Addressed This Visit       Digestive   Gastroesophageal reflux disease without esophagitis Continue current meds     Endocrine   Acquired hypothyroidism - Primary Continue meds      Other   Mixed hyperlipidemia   Relevant Orders   CBC with Differential/Platelet   Comprehensive metabolic panel   Lipid panel   Anxiety Continue current meds   Other Visit Diagnoses     Need for vaccination for zoster       Relevant Orders   Varicella-zoster vaccine IM   Type 2 diabetes mellitus with  hyperglycemia, without long-term current use of insulin (HCC)       Relevant Orders   CBC with Differential/Platelet   Comprehensive metabolic panel   Lipid panel   Hemoglobin A1c Low sugar/low carb diet     .  No orders of the defined types were placed in this encounter.   Orders Placed This Encounter  Procedures   Varicella-zoster vaccine IM   CBC with Differential/Platelet   Comprehensive metabolic panel   Lipid panel   Hemoglobin A1c  TSH     Follow-up: Return in about 4 months (around 11/05/2022) for chronic fasting follow up.  An After Visit Summary was printed and given to the patient.  Jettie Pagan [facility] [phone]

## 2022-07-08 LAB — CBC WITH DIFFERENTIAL/PLATELET
Basophils Absolute: 0.1 10*3/uL (ref 0.0–0.2)
Basos: 1 %
EOS (ABSOLUTE): 0.4 10*3/uL (ref 0.0–0.4)
Eos: 4 %
Hematocrit: 43.2 % (ref 34.0–46.6)
Hemoglobin: 13.6 g/dL (ref 11.1–15.9)
Immature Grans (Abs): 0 10*3/uL (ref 0.0–0.1)
Immature Granulocytes: 0 %
Lymphocytes Absolute: 2.6 10*3/uL (ref 0.7–3.1)
Lymphs: 27 %
MCH: 26.4 pg — ABNORMAL LOW (ref 26.6–33.0)
MCHC: 31.5 g/dL (ref 31.5–35.7)
MCV: 84 fL (ref 79–97)
Monocytes Absolute: 0.6 10*3/uL (ref 0.1–0.9)
Monocytes: 6 %
Neutrophils Absolute: 5.9 10*3/uL (ref 1.4–7.0)
Neutrophils: 62 %
Platelets: 244 10*3/uL (ref 150–450)
RBC: 5.16 x10E6/uL (ref 3.77–5.28)
RDW: 14.3 % (ref 11.7–15.4)
WBC: 9.6 10*3/uL (ref 3.4–10.8)

## 2022-07-08 LAB — COMPREHENSIVE METABOLIC PANEL
ALT: 25 IU/L (ref 0–32)
AST: 25 IU/L (ref 0–40)
Albumin/Globulin Ratio: 1.4 (ref 1.2–2.2)
Albumin: 4.2 g/dL (ref 3.8–4.9)
Alkaline Phosphatase: 137 IU/L — ABNORMAL HIGH (ref 44–121)
BUN/Creatinine Ratio: 15 (ref 9–23)
BUN: 9 mg/dL (ref 6–24)
Bilirubin Total: 0.3 mg/dL (ref 0.0–1.2)
CO2: 25 mmol/L (ref 20–29)
Calcium: 9.2 mg/dL (ref 8.7–10.2)
Chloride: 100 mmol/L (ref 96–106)
Creatinine, Ser: 0.61 mg/dL (ref 0.57–1.00)
Globulin, Total: 2.9 g/dL (ref 1.5–4.5)
Glucose: 114 mg/dL — ABNORMAL HIGH (ref 70–99)
Potassium: 4.1 mmol/L (ref 3.5–5.2)
Sodium: 140 mmol/L (ref 134–144)
Total Protein: 7.1 g/dL (ref 6.0–8.5)
eGFR: 106 mL/min/{1.73_m2} (ref >59)

## 2022-07-08 LAB — HEMOGLOBIN A1C
Est. average glucose Bld gHb Est-mCnc: 148 mg/dL
Hgb A1c MFr Bld: 6.8 % — ABNORMAL HIGH (ref 4.8–5.6)

## 2022-07-08 LAB — LIPID PANEL
Chol/HDL Ratio: 3 ratio (ref 0.0–4.4)
Cholesterol, Total: 136 mg/dL (ref 100–199)
HDL: 45 mg/dL (ref >39)
LDL Chol Calc (NIH): 67 mg/dL (ref 0–99)
Triglycerides: 134 mg/dL (ref 0–149)
VLDL Cholesterol Cal: 24 mg/dL (ref 5–40)

## 2022-07-08 LAB — CARDIOVASCULAR RISK ASSESSMENT

## 2022-07-08 LAB — TSH: TSH: 1 u[IU]/mL (ref 0.450–4.500)

## 2022-07-18 ENCOUNTER — Other Ambulatory Visit: Payer: Self-pay | Admitting: Physician Assistant

## 2022-07-18 DIAGNOSIS — E039 Hypothyroidism, unspecified: Secondary | ICD-10-CM

## 2022-08-18 ENCOUNTER — Other Ambulatory Visit: Payer: Self-pay | Admitting: Physician Assistant

## 2022-08-18 DIAGNOSIS — E039 Hypothyroidism, unspecified: Secondary | ICD-10-CM

## 2022-09-03 ENCOUNTER — Other Ambulatory Visit: Payer: Self-pay | Admitting: Physician Assistant

## 2022-10-17 ENCOUNTER — Other Ambulatory Visit: Payer: Self-pay | Admitting: Physician Assistant

## 2022-10-17 DIAGNOSIS — F331 Major depressive disorder, recurrent, moderate: Secondary | ICD-10-CM

## 2022-11-10 ENCOUNTER — Encounter: Payer: Self-pay | Admitting: Physician Assistant

## 2022-11-10 ENCOUNTER — Ambulatory Visit: Payer: BC Managed Care – PPO | Admitting: Physician Assistant

## 2022-11-10 VITALS — BP 118/82 | HR 71 | Temp 97.0°F | Ht 65.0 in | Wt 271.8 lb

## 2022-11-10 DIAGNOSIS — E039 Hypothyroidism, unspecified: Secondary | ICD-10-CM | POA: Diagnosis not present

## 2022-11-10 DIAGNOSIS — E782 Mixed hyperlipidemia: Secondary | ICD-10-CM

## 2022-11-10 DIAGNOSIS — E1165 Type 2 diabetes mellitus with hyperglycemia: Secondary | ICD-10-CM | POA: Diagnosis not present

## 2022-11-10 DIAGNOSIS — F419 Anxiety disorder, unspecified: Secondary | ICD-10-CM

## 2022-11-10 DIAGNOSIS — K219 Gastro-esophageal reflux disease without esophagitis: Secondary | ICD-10-CM

## 2022-11-10 DIAGNOSIS — Z1231 Encounter for screening mammogram for malignant neoplasm of breast: Secondary | ICD-10-CM

## 2022-11-10 DIAGNOSIS — Z87891 Personal history of nicotine dependence: Secondary | ICD-10-CM

## 2022-11-10 DIAGNOSIS — H6991 Unspecified Eustachian tube disorder, right ear: Secondary | ICD-10-CM

## 2022-11-10 MED ORDER — FLUTICASONE PROPIONATE 50 MCG/ACT NA SUSP: 2.0000 | Freq: Every day | NASAL | 6 refills | Status: AC

## 2022-11-10 NOTE — Progress Notes (Signed)
Subjective:  Patient ID: Kristen Vazquez, female    DOB: 1965-10-25  Age: 57 y.o. MRN: [id number]  Chief Complaint  Patient presents with   Hyperlipidemia   Hypothyroidism    Hyperlipidemia    Mixed hyperlipidemia  Pt presents with hyperlipidemia. Compliance with treatment has beengood The patient is compliant with medications, maintains a low cholesterol diet , follows up as directed , and maintains an exercise regimen . The patient denies experiencing any hypercholesterolemia related symptoms. Pt currently on crestor 10mg  qd  Pt with history of hypothyroidism - currently on synthroid 157mcg qd - due for labwork Voices no problems or concerns  Pt with history of GERD - stable on prilosec 20mg  qd  Pt with history of anxiety - she is doing well on lexapro 20mg  and uses ativan only as needed  Pt with history of diabetes - currently trying to control with diet - last hgb A1c was 6.8 - is going to try to watch diet and efforts at weight loss  Pt states she has some trouble with occasional wheezing with activity - says she uses her inhaler as needed which helps with symptoms Denies chest pain or palpitations She is a prior smoker of more than 30 years but did quit 12-13 years ago  Pt complains of right ear feeling stopped up  Current Outpatient Medications on File Prior to Visit  Medication Sig Dispense Refill   albuterol (VENTOLIN HFA) 108 (90 Base) MCG/ACT inhaler Inhale 2 puffs into the lungs every 6 (six) hours as needed for wheezing or shortness of breath. 8 g 5   cyclobenzaprine (FLEXERIL) 5 MG tablet Take 1 tablet (5 mg total) by mouth 3 (three) times daily as needed for muscle spasms. 30 tablet 0   eletriptan (RELPAX) 20 MG tablet Take 1 tablet (20 mg total) by mouth as needed for migraine or headache. May repeat in 2 hours if headache persists or recurs. 10 tablet 2   escitalopram (LEXAPRO) 20 MG tablet Take 1 tablet by mouth once daily 90 tablet 0   levothyroxine  (SYNTHROID) 125 MCG tablet Take 1 tablet by mouth once daily 90 tablet 0   LORazepam (ATIVAN) 0.5 MG tablet TAKE 1 TABLET BY MOUTH ONCE DAILY AS NEEDED FOR ANXIETY 30 tablet 0   meloxicam (MOBIC) 15 MG tablet Take 1 tablet (15 mg total) by mouth daily. 30 tablet 0   omeprazole (PRILOSEC) 20 MG capsule Take 20 mg by mouth daily.     rosuvastatin (CRESTOR) 10 MG tablet Take 1 tablet by mouth once daily 90 tablet 0   No current facility-administered medications on file prior to visit.   Past Medical History:  Diagnosis Date   Asthma    COPD (chronic obstructive pulmonary disease) (HCC)    Depression    GERD (gastroesophageal reflux disease)    Kidney stones    Past Surgical History:  Procedure Laterality Date   HERNIA REPAIR  2014    Family History  Problem Relation Age of Onset   Breast cancer Mother    Leukemia Father    Heart failure Brother    Alcohol abuse Brother    Social History   Socioeconomic History   Marital status: Widowed    Spouse name: Not on file   Number of children: 1   Years of education: Not on file   Highest education level: Not on file  Occupational History   Occupation: [employer] [place]  Tobacco Use   Smoking status: Former  Types: Cigarettes    Quit date: 2001    Years since quitting: 23.2   Smokeless tobacco: Never  Vaping Use   Vaping Use: Never used  Substance and Sexual Activity   Alcohol use: Yes    Alcohol/week: 2.0 standard drinks of alcohol    Types: 2 Glasses of wine per week    Comment: SOCIAL   Drug use: Never   Sexual activity: Not Currently  Other Topics Concern   Not on file  Social History Narrative   Not on file   Social Determinants of Health   Financial Resource Strain: Not on file  Food Insecurity: Not on file  Transportation Needs: Not on file  Physical Activity: Not on file  Stress: Not on file  Social Connections: Not on file    Review of Systems  CONSTITUTIONAL: Negative for chills, fatigue, fever,  unintentional weight gain and unintentional weight loss.  E/N/T: see HPI CARDIOVASCULAR: Negative for chest pain, dizziness, palpitations and pedal edema.  RESPIRATORY: see HPI GASTROINTESTINAL: Negative for abdominal pain, acid reflux symptoms, constipation, diarrhea, nausea and vomiting.  MSK: Negative for arthralgias and myalgias.  INTEGUMENTARY: Negative for rash.  NEUROLOGICAL: Negative for dizziness and headaches.  PSYCHIATRIC: Negative for sleep disturbance and to question depression screen.  Negative for depression, negative for anhedonia.       Objective:  PHYSICAL EXAM:   VS: BP 118/82 (BP Location: Right Arm, Patient Position: Sitting, Cuff Size: Large)   Pulse 71   Temp (!) 97 F (36.1 C) (Temporal)   Ht 5\' 5"  (1.651 m)   Wt 271 lb 12.8 oz (123.3 kg)   SpO2 97%   BMI 45.23 kg/m   GEN: Well nourished, well developed, in no acute distress  HEENT: normal external ears and nose - normal external auditory canals and TMS - hearing grossly normal - Lips, Teeth and Gums - normal  Neck: no JVD or masses - no thyromegaly Cardiac: RRR; no murmurs, rubs, or gallops,no edema -  Respiratory:  normal respiratory rate and pattern with no distress - normal breath sounds with no rales, rhonchi, wheezes or rubs MS: no deformity or atrophy  Skin: warm and dry, no rash  Psych: euthymic mood, appropriate affect and demeanor   Lab Results  Component Value Date   WBC 9.6 07/07/2022   HGB 13.6 07/07/2022   HCT 43.2 07/07/2022   PLT 244 07/07/2022   GLUCOSE 114 (H) 07/07/2022   CHOL 136 07/07/2022   TRIG 134 07/07/2022   HDL 45 07/07/2022   LDLCALC 67 07/07/2022   ALT 25 07/07/2022   AST 25 07/07/2022   NA 140 07/07/2022   K 4.1 07/07/2022   CL 100 07/07/2022   CREATININE 0.61 07/07/2022   BUN 9 07/07/2022   CO2 25 07/07/2022   TSH 1.000 07/07/2022   HGBA1C 6.8 (H) 07/07/2022      Assessment & Plan:   Problem List Items Addressed This Visit       Digestive    Gastroesophageal reflux disease without esophagitis Continue current meds     Endocrine   Acquired hypothyroidism - Primary Continue meds      Other   Mixed hyperlipidemia   Relevant Orders   CBC with Differential/Platelet   Comprehensive metabolic panel   Lipid panel Watch diet   Anxiety Continue current meds    Type 2 diabetes mellitus with hyperglycemia, without long-term current use of insulin (HCC)       Relevant Orders   CBC with Differential/Platelet  Comprehensive metabolic panel   Lipid panel   Hemoglobin A1c Low sugar/low carb diet  ETD Warm salt water gargles Trial fluticasone nasal spray as directed  Screening breast cancer Mammogram scheduled  Low dose chest CT screen for lung cancer      .  Meds ordered this encounter  Medications   fluticasone (FLONASE) 50 MCG/ACT nasal spray    Sig: Place 2 sprays into both nostrils daily.    Dispense:  16 g    Refill:  6    Order Specific Question:   Supervising Provider    Answer:   Rochel Brome [phone]     Orders Placed This Encounter  Procedures   MM DIGITAL SCREENING BILATERAL   CT CHEST LUNG CA SCREEN LOW DOSE W/O CM   Microalbumin / creatinine urine ratio   CBC with Differential/Platelet   Comprehensive metabolic panel   TSH   Lipid panel   Hemoglobin A1c     Follow-up: Return in about 4 months (around 03/12/2023) for chronic fasting follow up.  An After Visit Summary was printed and given to the patient.  Yetta Flock [facility] [phone]

## 2022-11-11 ENCOUNTER — Other Ambulatory Visit: Payer: Self-pay | Admitting: Physician Assistant

## 2022-11-11 DIAGNOSIS — E039 Hypothyroidism, unspecified: Secondary | ICD-10-CM

## 2022-11-11 LAB — CBC WITH DIFFERENTIAL/PLATELET
Basophils Absolute: 0 10*3/uL (ref 0.0–0.2)
Basos: 0 %
EOS (ABSOLUTE): 0.3 10*3/uL (ref 0.0–0.4)
Eos: 4 %
Hematocrit: 43.3 % (ref 34.0–46.6)
Hemoglobin: 13.4 g/dL (ref 11.1–15.9)
Immature Grans (Abs): 0 10*3/uL (ref 0.0–0.1)
Immature Granulocytes: 0 %
Lymphocytes Absolute: 2.5 10*3/uL (ref 0.7–3.1)
Lymphs: 30 %
MCH: 25.9 pg — ABNORMAL LOW (ref 26.6–33.0)
MCHC: 30.9 g/dL — ABNORMAL LOW (ref 31.5–35.7)
MCV: 84 fL (ref 79–97)
Monocytes Absolute: 0.5 10*3/uL (ref 0.1–0.9)
Monocytes: 6 %
Neutrophils Absolute: 4.9 10*3/uL (ref 1.4–7.0)
Neutrophils: 60 %
Platelets: 268 10*3/uL (ref 150–450)
RBC: 5.18 x10E6/uL (ref 3.77–5.28)
RDW: 13.9 % (ref 11.7–15.4)
WBC: 8.2 10*3/uL (ref 3.4–10.8)

## 2022-11-11 LAB — COMPREHENSIVE METABOLIC PANEL
ALT: 20 IU/L (ref 0–32)
AST: 19 IU/L (ref 0–40)
Albumin/Globulin Ratio: 1.7 (ref 1.2–2.2)
Albumin: 4.3 g/dL (ref 3.8–4.9)
Alkaline Phosphatase: 158 IU/L — ABNORMAL HIGH (ref 44–121)
BUN/Creatinine Ratio: 19 (ref 9–23)
BUN: 11 mg/dL (ref 6–24)
Bilirubin Total: 0.2 mg/dL (ref 0.0–1.2)
CO2: 27 mmol/L (ref 20–29)
Calcium: 9.5 mg/dL (ref 8.7–10.2)
Chloride: 100 mmol/L (ref 96–106)
Creatinine, Ser: 0.58 mg/dL (ref 0.57–1.00)
Globulin, Total: 2.6 g/dL (ref 1.5–4.5)
Glucose: 114 mg/dL — ABNORMAL HIGH (ref 70–99)
Potassium: 5.1 mmol/L (ref 3.5–5.2)
Sodium: 142 mmol/L (ref 134–144)
Total Protein: 6.9 g/dL (ref 6.0–8.5)
eGFR: 107 mL/min/{1.73_m2} (ref >59)

## 2022-11-11 LAB — MICROALBUMIN / CREATININE URINE RATIO
Creatinine, Urine: 61.3 mg/dL
Microalb/Creat Ratio: 16 mg/g creat (ref 0–29)
Microalbumin, Urine: 9.7 ug/mL

## 2022-11-11 LAB — LIPID PANEL
Chol/HDL Ratio: 3.5 ratio (ref 0.0–4.4)
Cholesterol, Total: 164 mg/dL (ref 100–199)
HDL: 47 mg/dL (ref >39)
LDL Chol Calc (NIH): 90 mg/dL (ref 0–99)
Triglycerides: 157 mg/dL — ABNORMAL HIGH (ref 0–149)
VLDL Cholesterol Cal: 27 mg/dL (ref 5–40)

## 2022-11-11 LAB — HEMOGLOBIN A1C
Est. average glucose Bld gHb Est-mCnc: 148 mg/dL
Hgb A1c MFr Bld: 6.8 % — ABNORMAL HIGH (ref 4.8–5.6)

## 2022-11-11 LAB — CARDIOVASCULAR RISK ASSESSMENT

## 2022-11-11 LAB — TSH: TSH: 1.19 u[IU]/mL (ref 0.450–4.500)

## 2022-11-11 MED ORDER — LEVOTHYROXINE SODIUM 125 MCG PO TABS: 125.0000 ug | ORAL_TABLET | Freq: Every day | ORAL | 1 refills | Status: DC

## 2022-11-17 DIAGNOSIS — Z122 Encounter for screening for malignant neoplasm of respiratory organs: Secondary | ICD-10-CM | POA: Diagnosis not present

## 2022-11-17 DIAGNOSIS — Z87891 Personal history of nicotine dependence: Secondary | ICD-10-CM | POA: Diagnosis not present

## 2022-11-17 DIAGNOSIS — I2510 Atherosclerotic heart disease of native coronary artery without angina pectoris: Secondary | ICD-10-CM | POA: Diagnosis not present

## 2022-11-17 DIAGNOSIS — J439 Emphysema, unspecified: Secondary | ICD-10-CM | POA: Diagnosis not present

## 2022-11-17 DIAGNOSIS — F17210 Nicotine dependence, cigarettes, uncomplicated: Secondary | ICD-10-CM | POA: Diagnosis not present

## 2022-11-19 ENCOUNTER — Other Ambulatory Visit: Payer: Self-pay

## 2022-11-19 ENCOUNTER — Other Ambulatory Visit: Payer: Self-pay | Admitting: Physician Assistant

## 2022-11-19 DIAGNOSIS — I2510 Atherosclerotic heart disease of native coronary artery without angina pectoris: Secondary | ICD-10-CM

## 2022-11-19 DIAGNOSIS — J438 Other emphysema: Secondary | ICD-10-CM

## 2022-11-19 DIAGNOSIS — Z87891 Personal history of nicotine dependence: Secondary | ICD-10-CM

## 2022-11-19 MED ORDER — BREZTRI AEROSPHERE 160-9-4.8 MCG/ACT IN AERO
2.0000 | INHALATION_SPRAY | Freq: Two times a day (BID) | RESPIRATORY_TRACT | 3 refills | Status: DC
Start: 2022-11-19 — End: 2023-03-10

## 2022-11-24 DIAGNOSIS — F32A Depression, unspecified: Secondary | ICD-10-CM | POA: Insufficient documentation

## 2022-11-24 DIAGNOSIS — N200 Calculus of kidney: Secondary | ICD-10-CM | POA: Insufficient documentation

## 2022-11-24 DIAGNOSIS — J449 Chronic obstructive pulmonary disease, unspecified: Secondary | ICD-10-CM | POA: Insufficient documentation

## 2022-11-24 DIAGNOSIS — J45909 Unspecified asthma, uncomplicated: Secondary | ICD-10-CM | POA: Insufficient documentation

## 2022-11-30 ENCOUNTER — Ambulatory Visit: Payer: BC Managed Care – PPO | Admitting: Cardiology

## 2022-12-04 ENCOUNTER — Other Ambulatory Visit: Payer: Self-pay | Admitting: Physician Assistant

## 2022-12-18 ENCOUNTER — Ambulatory Visit: Payer: BC Managed Care – PPO | Admitting: Cardiology

## 2023-01-02 NOTE — Progress Notes (Unsigned)
Cardiology Office Note:    Date:  01/04/2023   ID:  Kristen Vazquez, DOB 04-Sep-1965, MRN [id number]  PCP:  Marianne Sofia, PA-C  Cardiologist:  Norman Herrlich, MD   Referring MD: Marianne Sofia, PA-C  ASSESSMENT:    1. Coronary artery calcification seen on CT scan   2. Mixed hyperlipidemia   3. Chronic obstructive pulmonary disease, unspecified COPD type (HCC)    PLAN:    In order of problems listed above:  Not uncommon on CT scan for lung cancer screening however it is a marker of increased risk treatment and lipid-lowering therapy she has been on for the last year.  I did ask her to start taking low-dose aspirin 81 mg daily She prefers medical therapy I do not see a indication to do an ischemia evaluation at this time and I agree with her. I encouraged her to proceed with her weight loss that she intends and to trend and follow her blood pressure at home repeat by me in the office 132/80 Stable COPD  Next appointment I will see her back in my office as needed   Medication Adjustments/Labs and Tests Ordered: Current medicines are reviewed at length with the patient today.  Concerns regarding medicines are outlined above.  No orders of the defined types were placed in this encounter.  No orders of the defined types were placed in this encounter.    Chief complaint I had a CT scan done that showed coronary artery calcification cardiomegaly  History of Present Illness:    Kristen Vazquez is a 57 y.o. female with a history of COPD and hyperlipidemia on rosuvastatin who is being seen today for the evaluation of coronary artery calcification seen on CT scan at the request of Marianne Sofia, [place].She had lung cancer screening CT of the chest performed 11/16/2020 showing moderate coronary artery atherosclerosis and calcification emphysema was also noted.    She was seen ED [facility] with complaints of chest pain troponin was undetectable chest x-ray normal and EKG showed  sinus rhythm was normal she was not felt to have acute coronary syndrome and was discharged.  She is concerned over the results of her CT scan She has no known history of heart disease congenital rheumatic or atrial fibrillation She smoked 1 pack a day for 40 years but stopped 11 years ago She notices when she pushes himself more than moderate she is breathless and at times wheezes but has no edema orthopnea chest pain and wheezing palpitation or syncope She has been taking a high intensity statin for the last year Lipid profile.  11/10/2022 LDL 90 cholesterol 164 A1c 6.8 creatinine 0.58 hemoglobin 13.4 Past Medical History:  Diagnosis Date   Asthma    COPD (chronic obstructive pulmonary disease) (HCC)    Depression    GERD (gastroesophageal reflux disease)    Kidney stones     Past Surgical History:  Procedure Laterality Date   HERNIA REPAIR  2014    Current Medications: Current Meds  Medication Sig   albuterol (VENTOLIN HFA) 108 (90 Base) MCG/ACT inhaler Inhale 2 puffs into the lungs every 6 (six) hours as needed for wheezing or shortness of breath.   Budeson-Glycopyrrol-Formoterol (BREZTRI AEROSPHERE) 160-9-4.8 MCG/ACT AERO Inhale 2 puffs into the lungs in the morning and at bedtime.   cyclobenzaprine (FLEXERIL) 5 MG tablet Take 1 tablet (5 mg total) by mouth 3 (three) times daily as needed for muscle spasms.   eletriptan (RELPAX) 20 MG tablet  Take 1 tablet (20 mg total) by mouth as needed for migraine or headache. May repeat in 2 hours if headache persists or recurs.   escitalopram (LEXAPRO) 20 MG tablet Take 1 tablet by mouth once daily   fluticasone (FLONASE) 50 MCG/ACT nasal spray Place 2 sprays into both nostrils daily.   levothyroxine (SYNTHROID) 125 MCG tablet Take 1 tablet (125 mcg total) by mouth daily.   LORazepam (ATIVAN) 0.5 MG tablet TAKE 1 TABLET BY MOUTH ONCE DAILY AS NEEDED FOR ANXIETY   meloxicam (MOBIC) 15 MG tablet Take 1 tablet (15 mg total) by mouth daily.    omeprazole (PRILOSEC) 20 MG capsule Take 20 mg by mouth daily.   rosuvastatin (CRESTOR) 10 MG tablet Take 1 tablet by mouth once daily     Allergies:   Codeine and Sulfa antibiotics   Social History   Socioeconomic History   Marital status: Widowed    Spouse name: Not on file   Number of children: 1   Years of education: Not on file   Highest education level: Not on file  Occupational History   Occupation: [employer]  Tobacco Use   Smoking status: Former    Packs/day: 1.00    Years: 30.00    Additional pack years: 0.00    Total pack years: 30.00    Types: Cigarettes    Quit date: 2012    Years since quitting: 12.3   Smokeless tobacco: Never  Vaping Use   Vaping Use: Never used  Substance and Sexual Activity   Alcohol use: Yes    Alcohol/week: 2.0 standard drinks of alcohol    Types: 2 Glasses of wine per week    Comment: SOCIAL   Drug use: Never   Sexual activity: Not Currently  Other Topics Concern   Not on file  Social History Narrative   Not on file   Social Determinants of Health   Financial Resource Strain: Not on file  Food Insecurity: Not on file  Transportation Needs: Not on file  Physical Activity: Not on file  Stress: Not on file  Social Connections: Not on file     Family History: The patient's family history includes Alcohol abuse in her brother; Breast cancer in her mother; Heart failure in her brother; Leukemia in her father.  ROS:   ROS Please see the history of present illness.     All other systems reviewed and are negative.  EKGs/Labs/Other Studies Reviewed:    The following studies were reviewed today:       EKG:  EKG is sinus rhythm normal EKG ordered today.    Recent Labs: 11/10/2022: ALT 20; BUN 11; Creatinine, Ser 0.58; Hemoglobin 13.4; Platelets 268; Potassium 5.1; Sodium 142; TSH 1.190  Recent Lipid Panel    Component Value Date/Time   CHOL 164 11/10/2022 0941   TRIG 157 (H) 11/10/2022 0941   HDL 47 11/10/2022  0941   CHOLHDL 3.5 11/10/2022 0941   LDLCALC 90 11/10/2022 0941    Physical Exam:    VS:  BP (!) 148/92 (BP Location: Right Arm, Patient Position: Sitting, Cuff Size: Large)   Pulse 90   Ht 5\' 5"  (1.651 m)   Wt 276 lb 9.6 oz (125.5 kg)   SpO2 96%   BMI 46.03 kg/m     Wt Readings from Last 3 Encounters:  01/04/23 276 lb 9.6 oz (125.5 kg)  11/10/22 271 lb 12.8 oz (123.3 kg)  07/07/22 269 lb 6.4 oz (122.2 kg)  GEN:  Well nourished, well developed in no acute distress HEENT: Normal NECK: No JVD; No carotid bruits LYMPHATICS: No lymphadenopathy CARDIAC: RRR, no murmurs, rubs, gallops RESPIRATORY:  Clear to auscultation without rales, wheezing or rhonchi  ABDOMEN: Soft, non-tender, non-distended MUSCULOSKELETAL:  No edema; No deformity  SKIN: Warm and dry NEUROLOGIC:  Alert and oriented x 3 PSYCHIATRIC:  Normal affect     Signed, Norman Herrlich, MD  01/04/2023 3:03 PM    [facility]

## 2023-01-04 ENCOUNTER — Encounter: Payer: Self-pay | Admitting: Cardiology

## 2023-01-04 ENCOUNTER — Ambulatory Visit: Payer: BC Managed Care – PPO | Attending: Cardiology | Admitting: Cardiology

## 2023-01-04 VITALS — BP 132/80 | HR 90 | Ht 65.0 in | Wt 276.6 lb

## 2023-01-04 DIAGNOSIS — I2510 Atherosclerotic heart disease of native coronary artery without angina pectoris: Secondary | ICD-10-CM | POA: Diagnosis not present

## 2023-01-04 DIAGNOSIS — J449 Chronic obstructive pulmonary disease, unspecified: Secondary | ICD-10-CM | POA: Diagnosis not present

## 2023-01-04 DIAGNOSIS — E782 Mixed hyperlipidemia: Secondary | ICD-10-CM

## 2023-01-04 MED ORDER — ASPIRIN 81 MG PO TBEC: 81.0000 mg | DELAYED_RELEASE_TABLET | Freq: Every day | ORAL | 3 refills | Status: AC

## 2023-01-04 NOTE — Patient Instructions (Signed)
Medication Instructions:  Your physician has recommended you make the following change in your medication:   START: Aspirin 81 mg daily  *If you need a refill on your cardiac medications before your next appointment, please call your pharmacy*   Lab Work: None If you have labs (blood work) drawn today and your tests are completely normal, you will receive your results only by: MyChart Message (if you have MyChart) OR A paper copy in the mail If you have any lab test that is abnormal or we need to change your treatment, we will call you to review the results.   Testing/Procedures: None   Follow-Up: At [facility], you and your health needs are our priority.  As part of our continuing mission to provide you with exceptional heart care, we have created designated Provider Care Teams.  These Care Teams include your primary Cardiologist (physician) and Advanced Practice Providers (APPs -  Physician Assistants and Nurse Practitioners) who all work together to provide you with the care you need, when you need it.  We recommend signing up for the patient portal called "MyChart".  Sign up information is provided on this After Visit Summary.  MyChart is used to connect with patients for Virtual Visits (Telemedicine).  Patients are able to view lab/test results, encounter notes, upcoming appointments, etc.  Non-urgent messages can be sent to your provider as well.   To learn more about what you can do with MyChart, go to [email or web address].    Your next appointment:   Follow up as needed  Provider:   Brian Munley, MD    Other Instructions None  

## 2023-01-05 ENCOUNTER — Ambulatory Visit: Payer: BC Managed Care – PPO | Admitting: Cardiology

## 2023-01-17 ENCOUNTER — Other Ambulatory Visit: Payer: Self-pay | Admitting: Physician Assistant

## 2023-01-17 DIAGNOSIS — F331 Major depressive disorder, recurrent, moderate: Secondary | ICD-10-CM

## 2023-01-20 ENCOUNTER — Ambulatory Visit
Admission: RE | Admit: 2023-01-20 | Discharge: 2023-01-20 | Disposition: A | Discharge status: Home / Self Care | Payer: BC Managed Care – PPO | Source: Ambulatory Visit | Attending: Physician Assistant | Admitting: Physician Assistant

## 2023-01-20 DIAGNOSIS — Z1231 Encounter for screening mammogram for malignant neoplasm of breast: Secondary | ICD-10-CM

## 2023-03-04 ENCOUNTER — Other Ambulatory Visit: Payer: Self-pay | Admitting: Family Medicine

## 2023-03-10 ENCOUNTER — Other Ambulatory Visit: Payer: Self-pay | Admitting: Physician Assistant

## 2023-03-10 DIAGNOSIS — J438 Other emphysema: Secondary | ICD-10-CM

## 2023-03-16 ENCOUNTER — Encounter: Payer: Self-pay | Admitting: Physician Assistant

## 2023-03-16 ENCOUNTER — Ambulatory Visit: Payer: BC Managed Care – PPO | Admitting: Physician Assistant

## 2023-03-16 VITALS — BP 124/78 | HR 66 | Temp 97.0°F | Ht 65.0 in | Wt 276.4 lb

## 2023-03-16 DIAGNOSIS — E039 Hypothyroidism, unspecified: Secondary | ICD-10-CM | POA: Diagnosis not present

## 2023-03-16 DIAGNOSIS — Z1211 Encounter for screening for malignant neoplasm of colon: Secondary | ICD-10-CM

## 2023-03-16 DIAGNOSIS — E1165 Type 2 diabetes mellitus with hyperglycemia: Secondary | ICD-10-CM | POA: Diagnosis not present

## 2023-03-16 DIAGNOSIS — J438 Other emphysema: Secondary | ICD-10-CM | POA: Diagnosis not present

## 2023-03-16 DIAGNOSIS — E782 Mixed hyperlipidemia: Secondary | ICD-10-CM

## 2023-03-16 NOTE — Progress Notes (Signed)
Subjective:  Patient ID: Kristen Vazquez, female    DOB: May 30, 1966  Age: 57 y.o. MRN: [id number]  Chief Complaint  Patient presents with   Medical Management of Chronic Issues    Hyperlipidemia    Mixed hyperlipidemia  Pt presents with hyperlipidemia. Compliance with treatment has beengood The patient is compliant with medications, maintains a low cholesterol diet , follows up as directed , and maintains an exercise regimen . The patient denies experiencing any hypercholesterolemia related symptoms. Pt currently on crestor 10mg  qd  Pt with history of hypothyroidism - currently on synthroid qd - due for labwork Voices no problems or concerns  Pt with history of GERD - stable on prilosec 20mg  qd  Pt with history of anxiety - she is doing well on lexapro 20mg  and uses ativan only as needed  Pt with history of diabetes - currently trying to control with diet - last hgb A1c was 6.8 - is going to try to watch diet and efforts at weight loss  Pt would like to order cologuard  Current Outpatient Medications on File Prior to Visit  Medication Sig Dispense Refill   albuterol (VENTOLIN HFA) 108 (90 Base) MCG/ACT inhaler Inhale 2 puffs into the lungs every 6 (six) hours as needed for wheezing or shortness of breath. 8 g 5   aspirin EC 81 MG tablet Take 1 tablet (81 mg total) by mouth daily. Swallow whole. 90 tablet 3   BREZTRI AEROSPHERE 160-9-4.8 MCG/ACT AERO INHALE 2 PUFFS IN THE MORNING AND AT BEDTIME 11 g 0   eletriptan (RELPAX) 20 MG tablet Take 1 tablet (20 mg total) by mouth as needed for migraine or headache. May repeat in 2 hours if headache persists or recurs. 10 tablet 2   escitalopram (LEXAPRO) 20 MG tablet Take 1 tablet by mouth once daily 90 tablet 0   fluticasone (FLONASE) 50 MCG/ACT nasal spray Place 2 sprays into both nostrils daily. 16 g 6   levothyroxine (SYNTHROID) 125 MCG tablet Take 1 tablet (125 mcg total) by mouth daily. 90 tablet 1   LORazepam (ATIVAN) 0.5 MG  tablet TAKE 1 TABLET BY MOUTH ONCE DAILY AS NEEDED FOR ANXIETY 30 tablet 0   omeprazole (PRILOSEC) 20 MG capsule Take 20 mg by mouth daily.     rosuvastatin (CRESTOR) 10 MG tablet Take 1 tablet by mouth once daily 90 tablet 0   No current facility-administered medications on file prior to visit.   Past Medical History:  Diagnosis Date   Asthma    COPD (chronic obstructive pulmonary disease) (HCC)    Depression    GERD (gastroesophageal reflux disease)    Kidney stones    Past Surgical History:  Procedure Laterality Date   HERNIA REPAIR  2014    Family History  Problem Relation Age of Onset   Breast cancer Mother    Leukemia Father    Heart failure Brother    Alcohol abuse Brother    Social History   Socioeconomic History   Marital status: Widowed    Spouse name: Not on file   Number of children: 1   Years of education: Not on file   Highest education level: Not on file  Occupational History   Occupation: [employer]  Tobacco Use   Smoking status: Former    Current packs/day: 0.00    Average packs/day: 1 pack/day for 30.0 years (30.0 ttl pk-yrs)    Types: Cigarettes    Start date: 38  Quit date: 2012    Years since quitting: 12.5   Smokeless tobacco: Never  Vaping Use   Vaping status: Never Used  Substance and Sexual Activity   Alcohol use: Yes    Alcohol/week: 2.0 standard drinks of alcohol    Types: 2 Glasses of wine per week    Comment: SOCIAL   Drug use: Never   Sexual activity: Not Currently  Other Topics Concern   Not on file  Social History Narrative   Not on file   Social Determinants of Health   Financial Resource Strain: Low Risk  (03/16/2023)   Overall Financial Resource Strain (CARDIA)    Difficulty of Paying Living Expenses: Not hard at all  Food Insecurity: No Food Insecurity (03/16/2023)   Hunger Vital Sign    Worried About Running Out of Food in the Last Year: Never true    Ran Out of Food in the Last Year: Never true   Transportation Needs: No Transportation Needs (03/16/2023)   PRAPARE - Administrator, Civil Service (Medical): No    Lack of Transportation (Non-Medical): No  Physical Activity: Inactive (03/16/2023)   Exercise Vital Sign    Days of Exercise per Week: 0 days    Minutes of Exercise per Session: 0 min  Stress: No Stress Concern Present (03/16/2023)   [employer] of Occupational Health - Occupational Stress Questionnaire    Feeling of Stress : Not at all  Social Connections: Moderately Isolated (03/16/2023)   Social Connection and Isolation Panel [NHANES]    Frequency of Communication with Friends and Family: More than three times a week    Frequency of Social Gatherings with Friends and Family: More than three times a week    Attends Religious Services: More than 4 times per year    Active Member of [employer] or Organizations: No    Attends Banker Meetings: Never    Marital Status: Widowed    CONSTITUTIONAL: Negative for chills, fatigue, fever, unintentional weight gain and unintentional weight loss.  E/N/T: Negative for ear pain, nasal congestion and sore throat.  CARDIOVASCULAR: Negative for chest pain, dizziness, palpitations and pedal edema.  RESPIRATORY: Negative for recent cough and dyspnea.  GASTROINTESTINAL: Negative for abdominal pain, acid reflux symptoms, constipation, diarrhea, nausea and vomiting.  MSK: Negative for arthralgias and myalgias.  INTEGUMENTARY: Negative for rash.  NEUROLOGICAL: Negative for dizziness and headaches.  PSYCHIATRIC: Negative for sleep disturbance and to question depression screen.  Negative for depression, negative for anhedonia.       Objective:  PHYSICAL EXAM:   VS: BP 124/78 (BP Location: Left Arm, Patient Position: Sitting, Cuff Size: Large)   Pulse 66   Temp (!) 97 F (36.1 C) (Temporal)   Ht 5\' 5"  (1.651 m)   Wt 276 lb 6.4 oz (125.4 kg)   SpO2 97%   BMI 46.00 kg/m   GEN: Well nourished, well developed,  in no acute distress  Cardiac: RRR; no murmurs, rubs, or gallops,no edema -  Respiratory:  normal respiratory rate and pattern with no distress - normal breath sounds with no rales, rhonchi, wheezes or rubs MS: no deformity or atrophy  Skin: warm and dry, no rash  Neuro:  Alert and Oriented x 3, - CN II-Xii grossly intact Psych: euthymic mood, appropriate affect and demeanor  Lab Results  Component Value Date   WBC 8.2 11/10/2022   HGB 13.4 11/10/2022   HCT 43.3 11/10/2022   PLT 268 11/10/2022   GLUCOSE 114 (H)  11/10/2022   CHOL 164 11/10/2022   TRIG 157 (H) 11/10/2022   HDL 47 11/10/2022   LDLCALC 90 11/10/2022   ALT 20 11/10/2022   AST 19 11/10/2022   NA 142 11/10/2022   K 5.1 11/10/2022   CL 100 11/10/2022   CREATININE 0.58 11/10/2022   BUN 11 11/10/2022   CO2 27 11/10/2022   TSH 1.190 11/10/2022   HGBA1C 6.8 (H) 11/10/2022      Assessment & Plan:   Problem List Items Addressed This Visit       Digestive   Gastroesophageal reflux disease without esophagitis Continue current meds     Endocrine   Acquired hypothyroidism - Primary Continue meds      Other   Mixed hyperlipidemia   Relevant Orders   CBC with Differential/Platelet   Comprehensive metabolic panel   Lipid panel Watch diet   Anxiety Continue current meds    Type 2 diabetes mellitus with hyperglycemia, without long-term current use of insulin (HCC)       Relevant Orders   CBC with Differential/Platelet   Comprehensive metabolic panel   Lipid panel   Hemoglobin A1c Low sugar/low carb diet  Colon cancer screening Cologuard ordered      .  No orders of the defined types were placed in this encounter.    Orders Placed This Encounter  Procedures   CBC with Differential/Platelet   Comprehensive metabolic panel   TSH   Lipid panel   Hemoglobin A1c   Cologuard     Follow-up: Return in about 6 months (around 09/16/2023) for chronic fasting follow-up.  An After Visit Summary was  printed and given to the patient.  Jettie Pagan [facility] [phone]

## 2023-03-30 ENCOUNTER — Ambulatory Visit: Payer: BC Managed Care – PPO

## 2023-03-30 VITALS — BP 120/70 | HR 89 | Temp 98.7°F | Resp 14 | Ht 65.0 in | Wt 277.0 lb

## 2023-03-30 DIAGNOSIS — J449 Chronic obstructive pulmonary disease, unspecified: Secondary | ICD-10-CM | POA: Diagnosis not present

## 2023-03-30 DIAGNOSIS — R0609 Other forms of dyspnea: Secondary | ICD-10-CM

## 2023-03-30 DIAGNOSIS — E119 Type 2 diabetes mellitus without complications: Secondary | ICD-10-CM

## 2023-03-30 DIAGNOSIS — Z6841 Body Mass Index (BMI) 40.0 and over, adult: Secondary | ICD-10-CM

## 2023-03-30 HISTORY — DX: Type 2 diabetes mellitus without complications: E11.9

## 2023-03-30 HISTORY — DX: Other forms of dyspnea: R06.09

## 2023-03-30 NOTE — Assessment & Plan Note (Signed)
Has type 2 diabetes, controlled. But in view of her diabetes which is a major cardiac risk factor and SOB on exertion, I will order a myocardial perfusion imaging test.  Patient states she should be able to walk on the treadmill.  She does understand to call 911 if her shortness of breath gets any worse

## 2023-03-30 NOTE — Progress Notes (Signed)
Acute Office Visit  Subjective:    Patient ID: Kristen Vazquez, female    DOB: 25-Apr-1966, 57 y.o.   MRN: [id number]  Chief Complaint  Patient presents with   Shortness of Breath    HPI:  Kristen Vazquez is here for a problem visit.  Patient is in today for shortness of breath when she walks down and upstairs and also when she does any activities since Saturday. She mentioned to have on her back on the thoracic area on both sides. She had  lung cancer screening on 04/24. She denied chest pain, fever, chills, headache.  Denies cough. No exposure to covid. Has been feeling "great" other than her SOB with exertion. Does have asthma and COPD  Denies any recent travel. Denies any recent surgeries or procedures.    Past Medical History:  Diagnosis Date   Asthma    COPD (chronic obstructive pulmonary disease) (HCC)    Depression    GERD (gastroesophageal reflux disease)    Kidney stones     Past Surgical History:  Procedure Laterality Date   HERNIA REPAIR  2014    Family History  Problem Relation Age of Onset   Breast cancer Mother    Leukemia Father    Heart failure Brother    Alcohol abuse Brother     Social History   Socioeconomic History   Marital status: Widowed    Spouse name: Not on file   Number of children: 1   Years of education: Not on file   Highest education level: Not on file  Occupational History   Occupation: [employer] consultant [employer]  Tobacco Use   Smoking status: Former    Current packs/day: 0.00    Average packs/day: 1 pack/day for 30.0 years (30.0 ttl pk-yrs)    Types: Cigarettes    Start date: 27    Quit date: 2012    Years since quitting: 12.6   Smokeless tobacco: Never  Vaping Use   Vaping status: Never Used  Substance and Sexual Activity   Alcohol use: Yes    Alcohol/week: 2.0 standard drinks of alcohol    Types: 2 Glasses of wine per week    Comment: SOCIAL   Drug use: Never   Sexual activity: Not Currently  Other Topics Concern   Not  on file  Social History Narrative   Not on file   Social Determinants of Health   Financial Resource Strain: Low Risk  (03/16/2023)   Overall Financial Resource Strain (CARDIA)    Difficulty of Paying Living Expenses: Not hard at all  Food Insecurity: No Food Insecurity (03/16/2023)   Hunger Vital Sign    Worried About Running Out of Food in the Last Year: Never true    Ran Out of Food in the Last Year: Never true  Transportation Needs: No Transportation Needs (03/16/2023)   PRAPARE - Administrator, Civil Service (Medical): No    Lack of Transportation (Non-Medical): No  Physical Activity: Inactive (03/16/2023)   Exercise Vital Sign    Days of Exercise per Week: 0 days    Minutes of Exercise per Session: 0 min  Stress: No Stress Concern Present (03/16/2023)   [employer] of Occupational Health - Occupational Stress Questionnaire    Feeling of Stress : Not at all  Social Connections: Moderately Isolated (03/16/2023)   Social Connection and Isolation Panel [NHANES]    Frequency of Communication with Friends and Family: More than three times a week  Frequency of Social Gatherings with Friends and Family: More than three times a week    Attends Religious Services: More than 4 times per year    Active Member of Clubs or Organizations: No    Attends Banker Meetings: Never    Marital Status: Widowed  Intimate Partner Violence: Not At Risk (03/16/2023)   Humiliation, Afraid, Rape, and Kick questionnaire    Fear of Current or Ex-Partner: No    Emotionally Abused: No    Physically Abused: No    Sexually Abused: No    Outpatient Medications Prior to Visit  Medication Sig Dispense Refill   albuterol (VENTOLIN HFA) 108 (90 Base) MCG/ACT inhaler Inhale 2 puffs into the lungs every 6 (six) hours as needed for wheezing or shortness of breath. 8 g 5   aspirin EC 81 MG tablet Take 1 tablet (81 mg total) by mouth daily. Swallow whole. 90 tablet 3   BREZTRI  AEROSPHERE 160-9-4.8 MCG/ACT AERO INHALE 2 PUFFS IN THE MORNING AND AT BEDTIME 11 g 0   eletriptan (RELPAX) 20 MG tablet Take 1 tablet (20 mg total) by mouth as needed for migraine or headache. May repeat in 2 hours if headache persists or recurs. 10 tablet 2   escitalopram (LEXAPRO) 20 MG tablet Take 1 tablet by mouth once daily 90 tablet 0   fluticasone (FLONASE) 50 MCG/ACT nasal spray Place 2 sprays into both nostrils daily. 16 g 6   levothyroxine (SYNTHROID) 125 MCG tablet Take 1 tablet (125 mcg total) by mouth daily. 90 tablet 1   LORazepam (ATIVAN) 0.5 MG tablet TAKE 1 TABLET BY MOUTH ONCE DAILY AS NEEDED FOR ANXIETY 30 tablet 0   omeprazole (PRILOSEC) 20 MG capsule Take 20 mg by mouth daily.     rosuvastatin (CRESTOR) 10 MG tablet Take 1 tablet by mouth once daily 90 tablet 0   No facility-administered medications prior to visit.    Allergies  Allergen Reactions   Codeine    Sulfa Antibiotics Rash    Review of Systems  Constitutional:  Negative for chills, fatigue and fever.  HENT:  Negative for congestion, ear pain and sore throat.   Respiratory:  Positive for chest tightness and shortness of breath. Negative for cough.   Cardiovascular:  Negative for chest pain and palpitations.  Gastrointestinal:  Negative for abdominal pain, constipation, diarrhea, nausea and vomiting.  Endocrine: Negative for polydipsia, polyphagia and polyuria.  Genitourinary:  Negative for difficulty urinating and dysuria.  Musculoskeletal:  Negative for arthralgias, back pain and myalgias.  Skin:  Negative for rash.  Neurological:  Negative for headaches.  Psychiatric/Behavioral:  Negative for dysphoric mood. The patient is not nervous/anxious.        Objective:        03/30/2023    2:20 PM 03/16/2023   10:50 AM 01/04/2023    3:04 PM  Vitals with BMI  Height 5\' 5"  5\' 5"    Weight 277 lbs 276 lbs 6 oz   BMI 46.1 46   Systolic 120 124 409  Diastolic 70 78 80  Pulse 89 66     No data found.    Physical Exam Constitutional:      Appearance: She is well-developed. She is obese.  HENT:     Head: Normocephalic and atraumatic.     Comments: Small amount of cerumen noted in ear canals bilaterally Cardiovascular:     Rate and Rhythm: Normal rate and regular rhythm.  Pulmonary:     Effort: Pulmonary effort  is normal.     Breath sounds: Normal breath sounds. No wheezing, rhonchi or rales.  Chest:     Comments: Mild tenderness noted in the left sub scapular region Musculoskeletal:     Cervical back: Normal range of motion and neck supple.  Lymphadenopathy:     Cervical: No cervical adenopathy.  Neurological:     General: No focal deficit present.     Mental Status: She is alert.  Psychiatric:        Mood and Affect: Mood normal.        Behavior: Behavior normal.     Health Maintenance Due  Topic Date Due   FOOT EXAM  Never done   OPHTHALMOLOGY EXAM  Never done   Fecal DNA (Cologuard)  02/13/2023   INFLUENZA VACCINE  03/18/2023    There are no preventive care reminders to display for this patient.   Lab Results  Component Value Date   TSH 2.130 03/16/2023   Lab Results  Component Value Date   WBC 11.8 (H) 03/16/2023   HGB 13.3 03/16/2023   HCT 42.2 03/16/2023   MCV 87 03/16/2023   PLT 241 03/16/2023   Lab Results  Component Value Date   NA 140 03/16/2023   K 4.4 03/16/2023   CO2 26 03/16/2023   GLUCOSE 94 03/16/2023   BUN 11 03/16/2023   CREATININE 0.68 03/16/2023   BILITOT 0.3 03/16/2023   ALKPHOS 136 (H) 03/16/2023   AST 18 03/16/2023   ALT 18 03/16/2023   PROT 6.7 03/16/2023   ALBUMIN 4.4 03/16/2023   CALCIUM 9.5 03/16/2023   EGFR 102 03/16/2023   Lab Results  Component Value Date   CHOL 139 03/16/2023   Lab Results  Component Value Date   HDL 49 03/16/2023   Lab Results  Component Value Date   LDLCALC 67 03/16/2023   Lab Results  Component Value Date   TRIG 132 03/16/2023   Lab Results  Component Value Date   CHOLHDL 2.8  03/16/2023   Lab Results  Component Value Date   HGBA1C 6.7 (H) 03/16/2023       Assessment & Plan:  Dyspnea on exertion Assessment & Plan: Etiology unclear, does not appear to be exacerbation of her asthma or COPD at this time as her lungs sound normal and she has excellent oxygen saturation and normal vitals otherwise  Due to her type 2 diabetes, class III severe obesity and age, will order a cardiac stress test  Advised to go to the emergency room for any worsening symptoms  Advised to try to use her rescue inhaler ahead of time before any exertion to see if it helps.  I do not see the need for any imaging or blood work at this time as she had her screening CT chest done recently and had normal blood work.  For the mild superficial tenderness on palpation of her back, advised to apply heating pad or ice.  Orders: -     MYOCARDIAL PERFUSION IMAGING; Future  Controlled type 2 diabetes mellitus without complication, without long-term current use of insulin (HCC) Assessment & Plan: Has type 2 diabetes, controlled. But in view of her diabetes which is a major cardiac risk factor and SOB on exertion, I will order a myocardial perfusion imaging test.  Patient states she should be able to walk on the treadmill.  She does understand to call 911 if her shortness of breath gets any worse  Orders: -     MYOCARDIAL PERFUSION IMAGING;  Future  Chronic obstructive pulmonary disease, unspecified COPD type (HCC) Assessment & Plan: COPD- stable at this time, no wheezing noted No change in meds   Class 3 severe obesity without serious comorbidity with body mass index (BMI) of 45.0 to 49.9 in adult, unspecified obesity type (HCC) -     MYOCARDIAL PERFUSION IMAGING; Future     No orders of the defined types were placed in this encounter.   Orders Placed This Encounter  Procedures   MYOCARDIAL PERFUSION IMAGING     Follow-up: No follow-ups on file.  An After Visit Summary was  printed and given to the patient.  Windell Moment, MD [facility] [phone]

## 2023-03-30 NOTE — Assessment & Plan Note (Addendum)
Etiology unclear, does not appear to be exacerbation of her asthma or COPD at this time as her lungs sound normal and she has excellent oxygen saturation and normal vitals otherwise  Due to her type 2 diabetes, class III severe obesity and age, will order a cardiac stress test  Advised to go to the emergency room for any worsening symptoms  Advised to try to use her rescue inhaler ahead of time before any exertion to see if it helps.  I do not see the need for any imaging or blood work at this time as she had her screening CT chest done recently and had normal blood work.  For the mild superficial tenderness on palpation of her back, advised to apply heating pad or ice.

## 2023-03-30 NOTE — Assessment & Plan Note (Signed)
COPD- stable at this time, no wheezing noted No change in meds

## 2023-03-30 NOTE — Patient Instructions (Signed)
Does not appear to be a flare up of your copd or asthma at this time If you feel short of breath, try taking a puff or two of your rescue inhaler Continue to work on managing your diet well and exercise on a regular basis to control your sugars Will order a stress test to rule out your heart as a cause of this Recommend to call 911 if any severe symptoms.

## 2023-04-01 ENCOUNTER — Telehealth (HOSPITAL_COMMUNITY): Payer: Self-pay | Admitting: *Deleted

## 2023-04-01 ENCOUNTER — Encounter (HOSPITAL_COMMUNITY): Payer: Self-pay

## 2023-04-01 NOTE — Telephone Encounter (Signed)
Spoke with patient at her work and she asked that instructions for MPI be sent through my chart. My chart instructions sent.

## 2023-04-06 ENCOUNTER — Ambulatory Visit: Payer: BC Managed Care – PPO

## 2023-04-07 ENCOUNTER — Other Ambulatory Visit: Payer: Self-pay | Admitting: Physician Assistant

## 2023-04-07 ENCOUNTER — Ambulatory Visit: Payer: BC Managed Care – PPO

## 2023-04-07 DIAGNOSIS — J438 Other emphysema: Secondary | ICD-10-CM

## 2023-04-22 ENCOUNTER — Encounter (HOSPITAL_COMMUNITY): Payer: Self-pay

## 2023-04-25 ENCOUNTER — Other Ambulatory Visit: Payer: Self-pay | Admitting: Physician Assistant

## 2023-04-25 DIAGNOSIS — F331 Major depressive disorder, recurrent, moderate: Secondary | ICD-10-CM

## 2023-05-03 IMAGING — MG MM DIGITAL SCREENING BILAT W/ TOMO AND CAD
8 of 14 series · 8 of 40 positions shown · non-contrast
Comparison: Previous exam(s).

CLINICAL DATA: Screening.

EXAM:
DIGITAL SCREENING BILATERAL MAMMOGRAM WITH TOMOSYNTHESIS AND CAD
TECHNIQUE: Bilateral screening digital craniocaudal and mediolateral oblique
mammograms were obtained. Bilateral screening digital breast
tomosynthesis was performed. The images were evaluated with
computer-aided detection.

[L MLO synth-2D (1 of 2)]
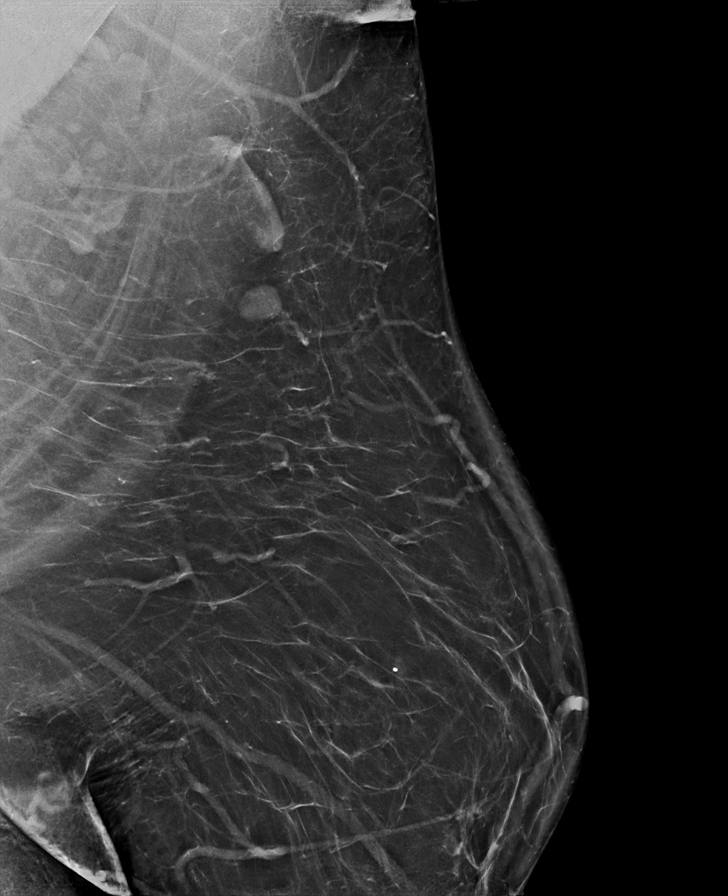

[L CC synth-2D (1 of 2)]
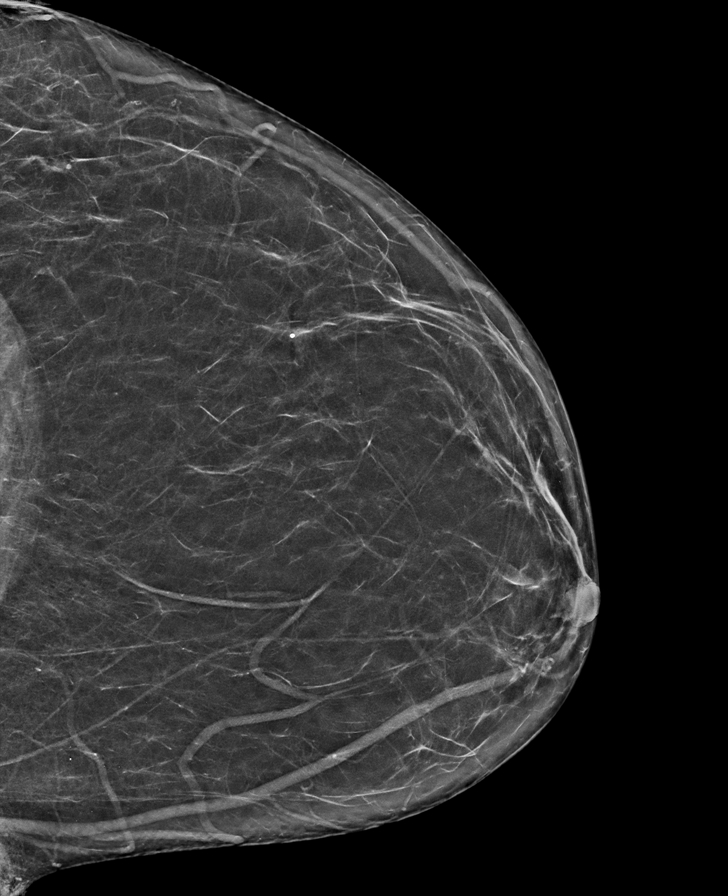

[R CC synth-2D (1 of 2)]
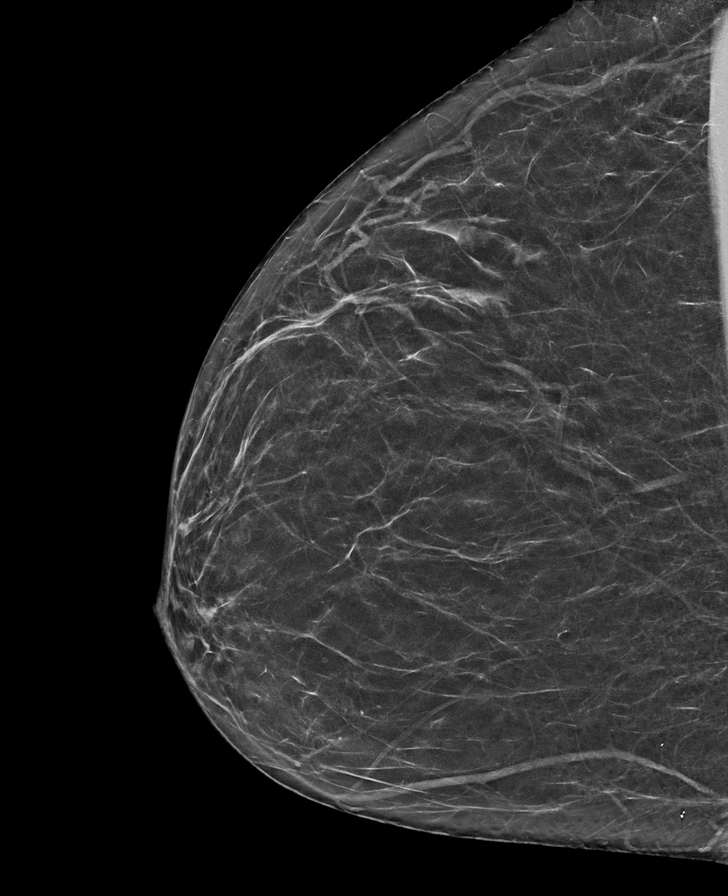

[R MLO synth-2D]
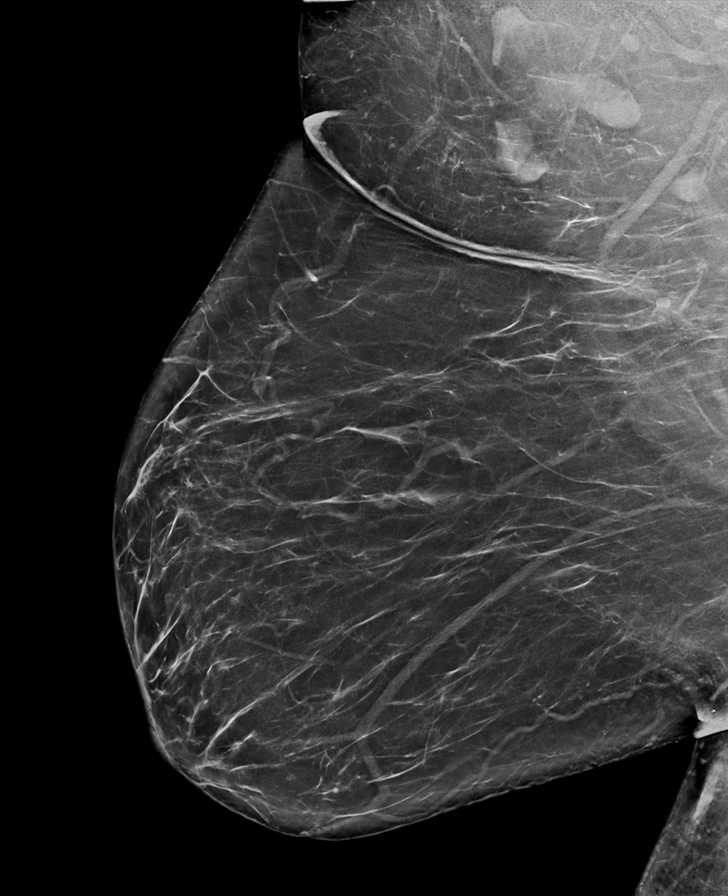

[R CC synth-2D (2 of 2)]
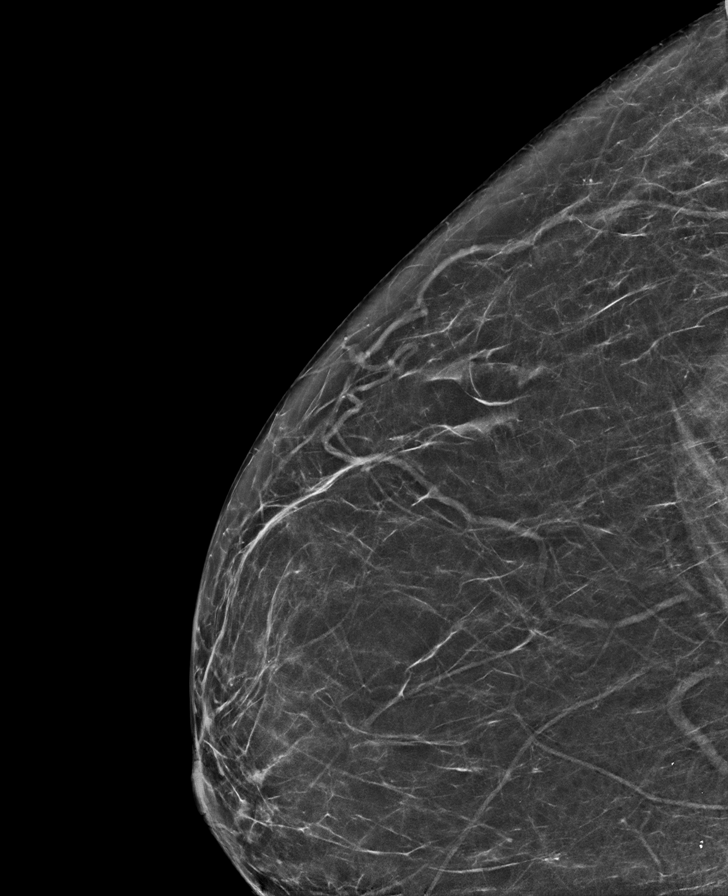

[L MLO synth-2D (2 of 2)]
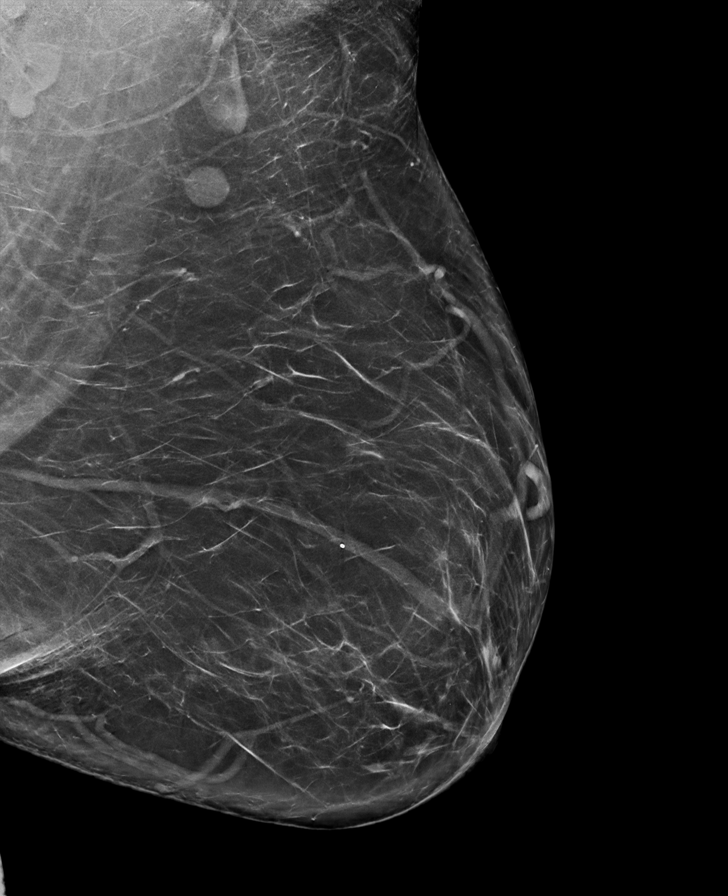

[L CC synth-2D (2 of 2)]
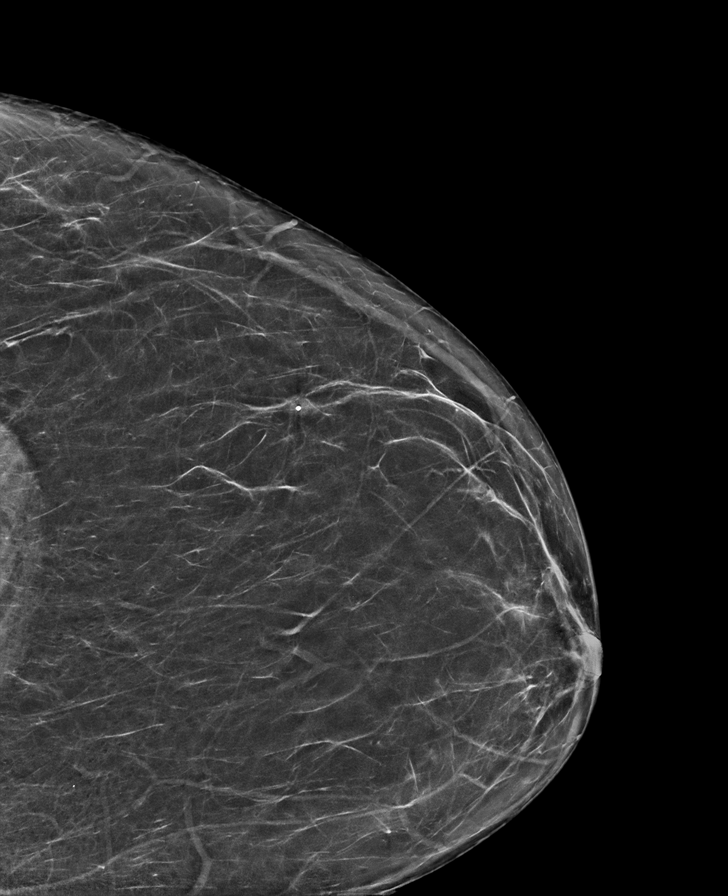

[L CC tomo · tomo slice 37/73.0]
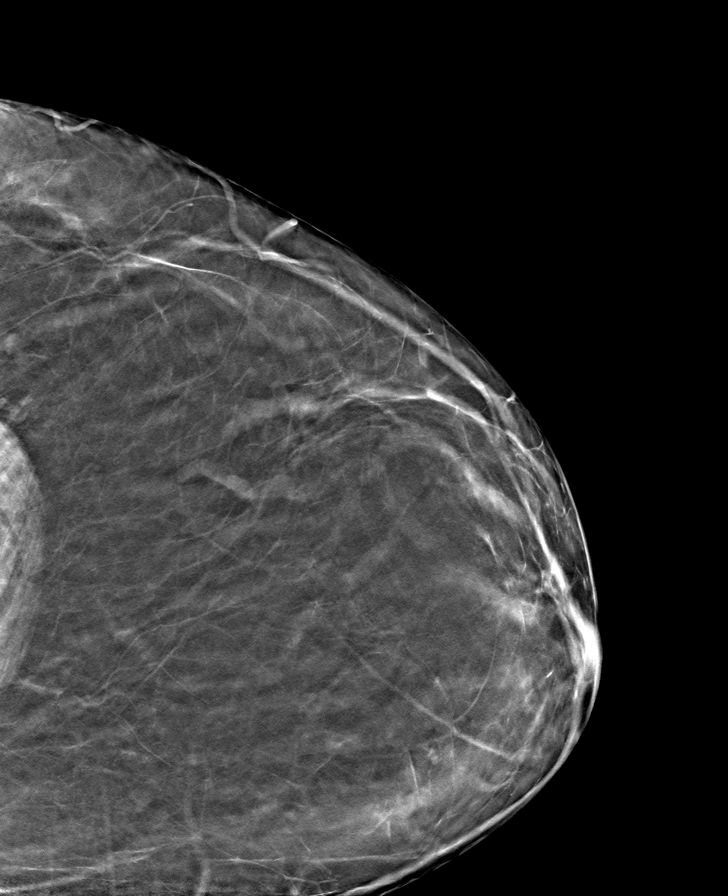

[8 of 40 positions shown; findings below may reference images not displayed]

ACR Breast Density Category b: There are scattered areas of
fibroglandular density.
FINDINGS: There are no findings suspicious for malignancy.
IMPRESSION: No mammographic evidence of malignancy. A result letter of this
screening mammogram will be mailed directly to the patient.

RECOMMENDATION:
Screening mammogram in one year. (Code:51-O-LD2)

BI-RADS CATEGORY  1: Negative.

## 2023-05-04 ENCOUNTER — Ambulatory Visit: Payer: BC Managed Care – PPO

## 2023-05-04 DIAGNOSIS — Z6841 Body Mass Index (BMI) 40.0 and over, adult: Secondary | ICD-10-CM | POA: Diagnosis not present

## 2023-05-04 DIAGNOSIS — E119 Type 2 diabetes mellitus without complications: Secondary | ICD-10-CM

## 2023-05-04 DIAGNOSIS — R0609 Other forms of dyspnea: Secondary | ICD-10-CM

## 2023-05-04 MED ORDER — TECHNETIUM TC 99M TETROFOSMIN IV KIT
29.0000 | PACK | Freq: Once | INTRAVENOUS | Status: AC | PRN
  Administered 2023-05-04: 29 via INTRAVENOUS

## 2023-05-05 ENCOUNTER — Ambulatory Visit: Payer: BC Managed Care – PPO

## 2023-05-05 MED ORDER — TECHNETIUM TC 99M TETROFOSMIN IV KIT
32.5000 | PACK | Freq: Once | INTRAVENOUS | Status: AC | PRN
  Administered 2023-05-05: 32.5 via INTRAVENOUS

## 2023-05-06 LAB — MYOCARDIAL PERFUSION IMAGING
Angina Index: 0
Estimated workload: 5.3
Exercise duration (min): 4 min
Exercise duration (sec): 30 s
LV dias vol: 70 mL (ref 46–106)
LV sys vol: 22 mL
MPHR: 164 {beats}/min
Nuc Stress EF: 69 %
Peak HR: 153 {beats}/min
Percent HR: 93 %
Rest HR: 74 {beats}/min
Rest Nuclear Isotope Dose: 32.5 mCi
SDS: 6
SRS: 2
SSS: 8
Stress Nuclear Isotope Dose: 29 mCi
TID: 0.86

## 2023-05-07 ENCOUNTER — Other Ambulatory Visit: Payer: Self-pay

## 2023-05-07 DIAGNOSIS — K219 Gastro-esophageal reflux disease without esophagitis: Secondary | ICD-10-CM | POA: Insufficient documentation

## 2023-05-07 DIAGNOSIS — R0609 Other forms of dyspnea: Secondary | ICD-10-CM

## 2023-05-07 DIAGNOSIS — E119 Type 2 diabetes mellitus without complications: Secondary | ICD-10-CM

## 2023-05-07 DIAGNOSIS — R9439 Abnormal result of other cardiovascular function study: Secondary | ICD-10-CM

## 2023-05-08 NOTE — Progress Notes (Unsigned)
Cardiology Office Note:    Date:  05/10/2023   ID:  Kristen Vazquez, DOB 13-May-1966, MRN [id number]  PCP:  Marianne Sofia, PA-C  Cardiologist:  Norman Herrlich, MD    Referring MD: Windell Moment, MD    ASSESSMENT:    1. Abnormal myocardial perfusion study   2. Coronary artery calcification seen on CT scan   3. Mixed hyperlipidemia   4. Chronic obstructive pulmonary disease, unspecified COPD type (HCC)    PLAN:    In order of problems listed above:  In the interim she had a myocardial perfusion study is mildly abnormal low risk I think confirms her conservative approach and will continue medical therapy including aspirin and her high intensity statin. Stable COPD   Next appointment: 3 months   Medication Adjustments/Labs and Tests Ordered: Current medicines are reviewed at length with the patient today.  Concerns regarding medicines are outlined above.  No orders of the defined types were placed in this encounter.  Meds ordered this encounter  Medications   nitroGLYCERIN (NITROSTAT) 0.4 MG SL tablet    Sig: Place 1 tablet (0.4 mg total) under the tongue every 5 (five) minutes as needed for chest pain.    Dispense:  25 tablet    Refill:  1     History of Present Illness:    Kristen Vazquez is a 57 y.o. female with a hx of COPD type 2 diabetes hyperlipidemia being treated with a statin and coronary calcification on chest CT lung cancer screening last seen 01/04/2023.  At that time she was not found to have cardiac symptoms was on a statin and not advised to have an ischemia evaluation.  She subsequently saw her PCP who ordered the myocardial perfusion study.  Myocardial perfusion study performed 05/05/2023 which showed findings of ischemia involving the basal and midportion of the anterior septal wall with normal left ventricular systolic function described as low risk.  The EKG was felt to be abnormal with upsloping ST depression however this does not meet criteria for EKG  ischemia.  Compliance with diet, lifestyle and medications: Yes  She has a friend with her for support she is quite apprehensive about this visit When I met with her last time she elected to continue aspirin and statin and we did not feel she required an ischemia evaluation In the interim she was sent for a myocardial perfusion study that is low risk and mildly abnormal She is having no exertional angina and is doing well with her COPD and takes aspirin. We discussed alternatives with the severity of her lung disease I be hesitant in giving her beta-blockers for a cardiac CTA and the option is referral to coronary angiography or continued medical treatment She is not having ACS she is having no anginal discomfort and we both agree she is best served with medical therapy I will give her prescription for nitroglycerin and see her back in the office in 3 months and if she develops significant ischemic symptoms could consider referral to coronary angiography The other way to view this is that she had a low risk ischemia evaluation with no indication that she needs to undergo coronary angiogram. She is comfortable with this approach Past Medical History:  Diagnosis Date   Abnormal blood chemistry 01/02/2020   Acquired hypothyroidism 12/14/2018   Acute laryngopharyngitis 01/02/2020   Acute pain of right shoulder due to trauma 10/23/2020   Acute sinusitis 08/19/2021   Anxiety 04/09/2020   Asthma    Controlled type  2 diabetes mellitus without complication, without long-term current use of insulin (HCC) 03/30/2023   COPD (chronic obstructive pulmonary disease) (HCC)    Depression    Dyspnea on exertion 03/30/2023   Encounter for screening mammogram for breast cancer 09/26/2019   Gastroesophageal reflux disease without esophagitis 12/14/2018   GERD (gastroesophageal reflux disease)    Kidney stones    Major depressive disorder, recurrent episode, moderate (HCC) 08/23/2018   Menorrhagia with  irregular cycle 04/01/2021   Mild intermittent asthma 09/26/2019   Mixed hyperlipidemia 09/26/2019   Pain of right upper extremity 10/23/2020   Pelvic organ prolapse quantification stage 1 cystocele 04/01/2021   Status post hysteroscopy 05/21/2021    Current Medications: Current Meds  Medication Sig   albuterol (VENTOLIN HFA) 108 (90 Base) MCG/ACT inhaler Inhale 2 puffs into the lungs every 6 (six) hours as needed for wheezing or shortness of breath.   aspirin EC 81 MG tablet Take 1 tablet (81 mg total) by mouth daily. Swallow whole.   BREZTRI AEROSPHERE 160-9-4.8 MCG/ACT AERO INHALE 2 PUFFS IN THE MORNING AND AT BEDTIME   eletriptan (RELPAX) 20 MG tablet Take 1 tablet (20 mg total) by mouth as needed for migraine or headache. May repeat in 2 hours if headache persists or recurs.   escitalopram (LEXAPRO) 20 MG tablet Take 1 tablet by mouth once daily   fluticasone (FLONASE) 50 MCG/ACT nasal spray Place 2 sprays into both nostrils daily.   levothyroxine (SYNTHROID) 125 MCG tablet Take 1 tablet (125 mcg total) by mouth daily.   LORazepam (ATIVAN) 0.5 MG tablet TAKE 1 TABLET BY MOUTH ONCE DAILY AS NEEDED FOR ANXIETY   nitroGLYCERIN (NITROSTAT) 0.4 MG SL tablet Place 1 tablet (0.4 mg total) under the tongue every 5 (five) minutes as needed for chest pain.   omeprazole (PRILOSEC) 20 MG capsule Take 20 mg by mouth daily.   rosuvastatin (CRESTOR) 10 MG tablet Take 1 tablet by mouth once daily      EKGs/Labs/Other Studies Reviewed:    The following studies were reviewed today:  Cardiac Studies & Procedures     STRESS TESTS  MYOCARDIAL PERFUSION IMAGING 05/05/2023  Narrative   Findings are consistent with mild ischemia involving basal and mid portion of the antero - septal wall. The study is low risk.   up sloping ST depression in the inferolateral leads (II, III, aVF, V5 and V6) was noted.   Left ventricular function is normal. Nuclear stress EF: 69%. The left ventricular ejection  fraction is hyperdynamic (>65%). End diastolic cavity size is normal.                  Recent Labs: 03/16/2023: ALT 18; BUN 11; Creatinine, Ser 0.68; Hemoglobin 13.3; Platelets 241; Potassium 4.4; Sodium 140; TSH 2.130  Recent Lipid Panel    Component Value Date/Time   CHOL 139 03/16/2023 1118   TRIG 132 03/16/2023 1118   HDL 49 03/16/2023 1118   CHOLHDL 2.8 03/16/2023 1118   LDLCALC 67 03/16/2023 1118    Physical Exam:    VS:  BP (!) 142/92   Pulse 94   Ht 5' 5.6" (1.666 m)   Wt 279 lb 6.4 oz (126.7 kg)   SpO2 95%   BMI 45.65 kg/m     Wt Readings from Last 3 Encounters:  05/10/23 279 lb 6.4 oz (126.7 kg)  05/04/23 277 lb (125.6 kg)  03/30/23 277 lb (125.6 kg)     GEN:  Well nourished, well developed in no acute distress  HEENT: Normal NECK: No JVD; No carotid bruits LYMPHATICS: No lymphadenopathy CARDIAC: RRR, no murmurs, rubs, gallops RESPIRATORY:  Clear to auscultation without rales, wheezing or rhonchi  ABDOMEN: Soft, non-tender, non-distended MUSCULOSKELETAL:  No edema; No deformity  SKIN: Warm and dry NEUROLOGIC:  Alert and oriented x 3 PSYCHIATRIC:  Normal affect    Signed, Norman Herrlich, MD  05/10/2023 11:06 AM    [facility]

## 2023-05-09 ENCOUNTER — Other Ambulatory Visit: Payer: Self-pay | Admitting: Physician Assistant

## 2023-05-09 DIAGNOSIS — E039 Hypothyroidism, unspecified: Secondary | ICD-10-CM

## 2023-05-10 ENCOUNTER — Ambulatory Visit: Payer: BC Managed Care – PPO | Attending: Cardiology | Admitting: Cardiology

## 2023-05-10 ENCOUNTER — Encounter: Payer: Self-pay | Admitting: Cardiology

## 2023-05-10 VITALS — BP 142/92 | HR 94 | Ht 65.6 in | Wt 279.4 lb

## 2023-05-10 DIAGNOSIS — E782 Mixed hyperlipidemia: Secondary | ICD-10-CM | POA: Diagnosis not present

## 2023-05-10 DIAGNOSIS — R9439 Abnormal result of other cardiovascular function study: Secondary | ICD-10-CM

## 2023-05-10 DIAGNOSIS — J449 Chronic obstructive pulmonary disease, unspecified: Secondary | ICD-10-CM

## 2023-05-10 DIAGNOSIS — I2510 Atherosclerotic heart disease of native coronary artery without angina pectoris: Secondary | ICD-10-CM | POA: Diagnosis not present

## 2023-05-10 MED ORDER — NITROGLYCERIN 0.4 MG SL SUBL: 0.4000 mg | SUBLINGUAL_TABLET | SUBLINGUAL | 1 refills | Status: AC | PRN

## 2023-05-10 NOTE — Patient Instructions (Signed)
Medication Instructions:  Your physician has recommended you make the following change in your medication:   START: Nitroglycerin 0.4 mg under the tongue every 5 minutes as needed for chest pain.  *If you need a refill on your cardiac medications before your next appointment, please call your pharmacy*   Lab Work: None If you have labs (blood work) drawn today and your tests are completely normal, you will receive your results only by: MyChart Message (if you have MyChart) OR A paper copy in the mail If you have any lab test that is abnormal or we need to change your treatment, we will call you to review the results.   Testing/Procedures: None   Follow-Up: At [facility], you and your health needs are our priority.  As part of our continuing mission to provide you with exceptional heart care, we have created designated Provider Care Teams.  These Care Teams include your primary Cardiologist (physician) and Advanced Practice Providers (APPs -  Physician Assistants and Nurse Practitioners) who all work together to provide you with the care you need, when you need it.  We recommend signing up for the patient portal called "MyChart".  Sign up information is provided on this After Visit Summary.  MyChart is used to connect with patients for Virtual Visits (Telemedicine).  Patients are able to view lab/test results, encounter notes, upcoming appointments, etc.  Non-urgent messages can be sent to your provider as well.   To learn more about what you can do with MyChart, go to [email or web address].    Your next appointment:   3 month(s)  Provider:   Norman Herrlich, MD    Other Instructions None

## 2023-05-16 ENCOUNTER — Other Ambulatory Visit: Payer: Self-pay | Admitting: Physician Assistant

## 2023-05-16 DIAGNOSIS — J438 Other emphysema: Secondary | ICD-10-CM

## 2023-06-04 ENCOUNTER — Other Ambulatory Visit: Payer: Self-pay | Admitting: Physician Assistant

## 2023-06-11 ENCOUNTER — Other Ambulatory Visit: Payer: Self-pay | Admitting: Physician Assistant

## 2023-06-11 DIAGNOSIS — E039 Hypothyroidism, unspecified: Secondary | ICD-10-CM

## 2023-07-22 ENCOUNTER — Other Ambulatory Visit: Payer: Self-pay | Admitting: Physician Assistant

## 2023-07-22 DIAGNOSIS — F331 Major depressive disorder, recurrent, moderate: Secondary | ICD-10-CM

## 2023-08-15 NOTE — Progress Notes (Signed)
 Cardiology Office Note:    Date:  08/17/2023   ID:  Kristen Vazquez, DOB 16-Jun-1966, MRN [id number]  PCP:  Nicholaus Credit, PA-C  Cardiologist:  Redell Leiter, MD    Referring MD: Nicholaus Credit, PA-C    ASSESSMENT:    1. Coronary artery calcification seen on CT scan   2. Abnormal myocardial perfusion study   3. Chronic obstructive pulmonary disease, unspecified COPD type (HCC)   4. Mixed hyperlipidemia    PLAN:    In order of problems listed above:  Not uncommon to find coronary calcification on lung cancer screening CT scans Not having anginal discomfort had a very minimally abnormal myocardial perfusion study felt to be best treated medically She has done well with medical therapy no anginal discomfort and will continue aspirin  or high intensity statin and nitroglycerin  if needed.  I do not think she needs other medications like long-acting nitrates or calcium  channel blocker at this time Stable COPD She tolerates her high intensity statin she has upcoming labs with her PCP She will plan to see me back in the office as needed   Next appointment: As needed   Medication Adjustments/Labs and Tests Ordered: Current medicines are reviewed at length with the patient today.  Concerns regarding medicines are outlined above.  No orders of the defined types were placed in this encounter.  No orders of the defined types were placed in this encounter.    History of Present Illness:    Kristen Vazquez is a 57 y.o. female with a hx of coronary artery calcification noted incidentally on a chest CT lung cancer screening mildly abnormal myocardial perfusion study hyperlipidemia and COPD last seen 05/10/2023.  At the last visit she opted for conservative approach with ongoing medical therapy including aspirin  and high intensity statin.  Compliance with diet, lifestyle and medications: Yes  I brought her back today to be sure she has done well on medical therapy she feels well tolerates  her high intensity statin has upcoming lab work with PCP and has had no anginal discomfort has not used nitroglycerin  With her current bronchodilator she is not experiencing shortness of breath edema orthopnea palpitation or syncope Past Medical History:  Diagnosis Date   Abnormal blood chemistry 01/02/2020   Acquired hypothyroidism 12/14/2018   Acute laryngopharyngitis 01/02/2020   Acute pain of right shoulder due to trauma 10/23/2020   Acute sinusitis 08/19/2021   Anxiety 04/09/2020   Asthma    Controlled type 2 diabetes mellitus without complication, without long-term current use of insulin (HCC) 03/30/2023   COPD (chronic obstructive pulmonary disease) (HCC)    Depression    Dyspnea on exertion 03/30/2023   Encounter for screening mammogram for breast cancer 09/26/2019   Gastroesophageal reflux disease without esophagitis 12/14/2018   GERD (gastroesophageal reflux disease)    Kidney stones    Major depressive disorder, recurrent episode, moderate (HCC) 08/23/2018   Menorrhagia with irregular cycle 04/01/2021   Mild intermittent asthma 09/26/2019   Mixed hyperlipidemia 09/26/2019   Pain of right upper extremity 10/23/2020   Pelvic organ prolapse quantification stage 1 cystocele 04/01/2021   Status post hysteroscopy 05/21/2021    Current Medications: Current Meds  Medication Sig   albuterol  (VENTOLIN  HFA) 108 (90 Base) MCG/ACT inhaler Inhale 2 puffs into the lungs every 6 (six) hours as needed for wheezing or shortness of breath.   aspirin  EC 81 MG tablet Take 1 tablet (81 mg total) by mouth daily. Swallow whole.   Budeson-Glycopyrrol-Formoterol  (BREZTRI  AEROSPHERE) 160-9-4.8  MCG/ACT AERO INHALE 2 PUFFS IN THE MORNING AND AT BEDTIME   eletriptan  (RELPAX ) 20 MG tablet Take 1 tablet (20 mg total) by mouth as needed for migraine or headache. May repeat in 2 hours if headache persists or recurs.   escitalopram  (LEXAPRO ) 20 MG tablet Take 1 tablet by mouth once daily   fluticasone   (FLONASE ) 50 MCG/ACT nasal spray Place 2 sprays into both nostrils daily.   levothyroxine  (SYNTHROID ) 125 MCG tablet Take 1 tablet by mouth once daily   LORazepam  (ATIVAN ) 0.5 MG tablet TAKE 1 TABLET BY MOUTH ONCE DAILY AS NEEDED FOR ANXIETY   nitroGLYCERIN  (NITROSTAT ) 0.4 MG SL tablet Place 1 tablet (0.4 mg total) under the tongue every 5 (five) minutes as needed for chest pain.   omeprazole (PRILOSEC) 20 MG capsule Take 20 mg by mouth daily.   rosuvastatin  (CRESTOR ) 10 MG tablet Take 1 tablet by mouth once daily      EKGs/Labs/Other Studies Reviewed:    The following studies were reviewed today:  Cardiac Studies & Procedures     STRESS TESTS  MYOCARDIAL PERFUSION IMAGING 05/05/2023  Narrative   Findings are consistent with mild ischemia involving basal and mid portion of the antero - septal wall. The study is low risk.   up sloping ST depression in the inferolateral leads (II, III, aVF, V5 and V6) was noted.   Left ventricular function is normal. Nuclear stress EF: 69%. The left ventricular ejection fraction is hyperdynamic (>65%). End diastolic cavity size is normal.                  Recent Labs: 03/16/2023: ALT 18; BUN 11; Creatinine, Ser 0.68; Hemoglobin 13.3; Platelets 241; Potassium 4.4; Sodium 140; TSH 2.130  Recent Lipid Panel    Component Value Date/Time   CHOL 139 03/16/2023 1118   TRIG 132 03/16/2023 1118   HDL 49 03/16/2023 1118   CHOLHDL 2.8 03/16/2023 1118   LDLCALC 67 03/16/2023 1118    Physical Exam:    VS:  BP (!) 148/92   Pulse 70   Ht 5' 5 (1.651 m)   Wt 282 lb 12.8 oz (128.3 kg)   SpO2 98%   BMI 47.06 kg/m     Wt Readings from Last 3 Encounters:  08/17/23 282 lb 12.8 oz (128.3 kg)  05/10/23 279 lb 6.4 oz (126.7 kg)  05/04/23 277 lb (125.6 kg)     GEN: Obese well nourished, well developed in no acute distress HEENT: Normal NECK: No JVD; No carotid bruits LYMPHATICS: No lymphadenopathy CARDIAC: RRR, no murmurs, rubs, gallops RESPIRATORY:   Clear to auscultation without rales, wheezing or rhonchi  ABDOMEN: Soft, non-tender, non-distended MUSCULOSKELETAL:  No edema; No deformity  SKIN: Warm and dry NEUROLOGIC:  Alert and oriented x 3 PSYCHIATRIC:  Normal affect    Signed, Redell Leiter, MD  08/17/2023 10:46 AM    [facility]

## 2023-08-17 ENCOUNTER — Encounter: Payer: Self-pay | Admitting: Cardiology

## 2023-08-17 ENCOUNTER — Ambulatory Visit: Payer: BC Managed Care – PPO | Attending: Cardiology | Admitting: Cardiology

## 2023-08-17 VITALS — BP 130/70 | HR 70 | Ht 65.0 in | Wt 282.8 lb

## 2023-08-17 DIAGNOSIS — I2510 Atherosclerotic heart disease of native coronary artery without angina pectoris: Secondary | ICD-10-CM | POA: Diagnosis not present

## 2023-08-17 DIAGNOSIS — R9439 Abnormal result of other cardiovascular function study: Secondary | ICD-10-CM | POA: Diagnosis not present

## 2023-08-17 DIAGNOSIS — J449 Chronic obstructive pulmonary disease, unspecified: Secondary | ICD-10-CM

## 2023-08-17 DIAGNOSIS — E782 Mixed hyperlipidemia: Secondary | ICD-10-CM

## 2023-08-17 NOTE — Patient Instructions (Signed)
 Medication Instructions:  Your physician recommends that you continue on your current medications as directed. Please refer to the Current Medication list given to you today.  *If you need a refill on your cardiac medications before your next appointment, please call your pharmacy*   Lab Work: None Ordered If you have labs (blood work) drawn today and your tests are completely normal, you will receive your results only by: MyChart Message (if you have MyChart) OR A paper copy in the mail If you have any lab test that is abnormal or we need to change your treatment, we will call you to review the results.   Testing/Procedures: None Ordered   Follow-Up: At [facility], you and your health needs are our priority.  As part of our continuing mission to provide you with exceptional heart care, we have created designated Provider Care Teams.  These Care Teams include your primary Cardiologist (physician) and Advanced Practice Providers (APPs -  Physician Assistants and Nurse Practitioners) who all work together to provide you with the care you need, when you need it.  We recommend signing up for the patient portal called "MyChart".  Sign up information is provided on this After Visit Summary.  MyChart is used to connect with patients for Virtual Visits (Telemedicine).  Patients are able to view lab/test results, encounter notes, upcoming appointments, etc.  Non-urgent messages can be sent to your provider as well.   To learn more about what you can do with MyChart, go to [email or web address].    Your next appointment:   Follow up as needed

## 2023-09-03 ENCOUNTER — Other Ambulatory Visit: Payer: Self-pay | Admitting: Family Medicine

## 2023-09-13 ENCOUNTER — Other Ambulatory Visit: Payer: Self-pay | Admitting: Family Medicine

## 2023-09-13 DIAGNOSIS — J438 Other emphysema: Secondary | ICD-10-CM

## 2023-09-16 ENCOUNTER — Ambulatory Visit: Payer: BC Managed Care – PPO | Admitting: Physician Assistant

## 2023-09-16 ENCOUNTER — Encounter: Payer: Self-pay | Admitting: Physician Assistant

## 2023-09-16 VITALS — BP 130/82 | HR 82 | Temp 97.2°F | Resp 20 | Ht 65.5 in | Wt 286.0 lb

## 2023-09-16 DIAGNOSIS — E119 Type 2 diabetes mellitus without complications: Secondary | ICD-10-CM | POA: Diagnosis not present

## 2023-09-16 DIAGNOSIS — G43009 Migraine without aura, not intractable, without status migrainosus: Secondary | ICD-10-CM

## 2023-09-16 DIAGNOSIS — R0609 Other forms of dyspnea: Secondary | ICD-10-CM

## 2023-09-16 DIAGNOSIS — R9439 Abnormal result of other cardiovascular function study: Secondary | ICD-10-CM

## 2023-09-16 DIAGNOSIS — Z1231 Encounter for screening mammogram for malignant neoplasm of breast: Secondary | ICD-10-CM

## 2023-09-16 DIAGNOSIS — K219 Gastro-esophageal reflux disease without esophagitis: Secondary | ICD-10-CM

## 2023-09-16 DIAGNOSIS — I2510 Atherosclerotic heart disease of native coronary artery without angina pectoris: Secondary | ICD-10-CM

## 2023-09-16 DIAGNOSIS — E782 Mixed hyperlipidemia: Secondary | ICD-10-CM

## 2023-09-16 DIAGNOSIS — J438 Other emphysema: Secondary | ICD-10-CM

## 2023-09-16 DIAGNOSIS — R5383 Other fatigue: Secondary | ICD-10-CM

## 2023-09-16 DIAGNOSIS — F419 Anxiety disorder, unspecified: Secondary | ICD-10-CM

## 2023-09-16 DIAGNOSIS — Z1211 Encounter for screening for malignant neoplasm of colon: Secondary | ICD-10-CM

## 2023-09-16 DIAGNOSIS — E039 Hypothyroidism, unspecified: Secondary | ICD-10-CM

## 2023-09-16 MED ORDER — ELETRIPTAN HYDROBROMIDE 20 MG PO TABS: 20.0000 mg | ORAL_TABLET | ORAL | 2 refills | Status: DC | PRN

## 2023-09-16 MED ORDER — METOPROLOL SUCCINATE ER 25 MG PO TB24: 25.0000 mg | ORAL_TABLET | Freq: Every day | ORAL | 0 refills | Status: DC

## 2023-09-16 MED ORDER — BREZTRI AEROSPHERE 160-9-4.8 MCG/ACT IN AERO: 2.0000 | INHALATION_SPRAY | Freq: Two times a day (BID) | RESPIRATORY_TRACT | 5 refills | Status: DC

## 2023-09-16 MED ORDER — AIRSUPRA 90-80 MCG/ACT IN AERO: 2.0000 | INHALATION_SPRAY | Freq: Four times a day (QID) | RESPIRATORY_TRACT | 5 refills | Status: AC | PRN

## 2023-09-16 MED ORDER — LORAZEPAM 0.5 MG PO TABS: 0.5000 mg | ORAL_TABLET | Freq: Every day | ORAL | 0 refills | Status: DC | PRN

## 2023-09-16 NOTE — Progress Notes (Signed)
Subjective:  Patient ID: Kristen Vazquez, female    DOB: [birth year]  Age: 58 y.o. MRN: [id number]  Chief Complaint  Patient presents with   Medical Management of Chronic Issues    Hyperlipidemia    Mixed hyperlipidemia  Pt presents with hyperlipidemia. Compliance with treatment has beengood The patient is compliant with medications, maintains a low cholesterol diet , follows up as directed , and maintains an exercise regimen . The patient denies experiencing any hypercholesterolemia related symptoms. Pt currently on crestor 10mg  qd  Pt with  hypothyroidism - currently on synthroid qd - due for labwork Voices no problems or concerns  Pt with  GERD - stable on prilosec 20mg  qd  Pt with anxiety - she is doing well on lexapro 20mg  and uses ativan only as needed (requests refill of med)  Pt with migraines.  She currently uses relpax 20mg  when needed which most of the time works for her however she has noted in the past few months that her headaches have increased in frequency - she actually has a migraine today but has not taken medication yet  Pt with diabetes - currently trying to control with diet - last hgb A1c was 6.7 - she has not been started on medication at this time - has actually gained weight since last visit Is not watching diet  Pt has seen cardiology with last appt in December.  Pt has been having exertional dyspnea since last year (which does not seem to correlate with her COPD symptoms and does not have wheezing most times that this happens) she states she gets very winded after any major activity , with lifting, and now even when walking to her mailbox.  States her symptoms have worsened since last visit with me in July.  She had seen Dr Faylene Kurtz with these symptoms in August and a stress test was ordered.  Her stress test was actually positive and mild ischemia noted involving basal and mid portion of antero-septal wall. Cardiology recommendation was to treat  medically.  However given the fact her stress test was positive, she is overweight and diabetic, she does have hyperlipidemia and has coronary artery calcifications on CT, is a smoker, has a family history of CAD and heart failure and especially that she is having now significant exertional dyspnea that is worsening I advise she needs to have further cardiac intervention/testing I will send a note to Dr Dulce Sellar regarding this issue and forward the office note for his office to reach out to patient for scheduling   Pt would like cologuard ordered  Pt would like mammogram ordered Current Outpatient Medications on File Prior to Visit  Medication Sig Dispense Refill   aspirin EC 81 MG tablet Take 1 tablet (81 mg total) by mouth daily. Swallow whole. 90 tablet 3   escitalopram (LEXAPRO) 20 MG tablet Take 1 tablet by mouth once daily 90 tablet 0   fluticasone (FLONASE) 50 MCG/ACT nasal spray Place 2 sprays into both nostrils daily. 16 g 6   levothyroxine (SYNTHROID) 125 MCG tablet Take 1 tablet by mouth once daily 90 tablet 0   nitroGLYCERIN (NITROSTAT) 0.4 MG SL tablet Place 1 tablet (0.4 mg total) under the tongue every 5 (five) minutes as needed for chest pain. 25 tablet 1   omeprazole (PRILOSEC) 20 MG capsule Take 20 mg by mouth daily.     rosuvastatin (CRESTOR) 10 MG tablet Take 1 tablet by mouth once daily 90 tablet 0   No current facility-administered  medications on file prior to visit.   Past Medical History:  Diagnosis Date   Abnormal blood chemistry 01/02/2020   Acquired hypothyroidism 12/14/2018   Acute laryngopharyngitis 01/02/2020   Acute pain of right shoulder due to trauma 10/23/2020   Acute sinusitis 08/19/2021   Anxiety 04/09/2020   Asthma    Controlled type 2 diabetes mellitus without complication, without long-term current use of insulin (HCC) 03/30/2023   COPD (chronic obstructive pulmonary disease) (HCC)    Depression    Dyspnea on exertion 03/30/2023   Encounter for  screening mammogram for breast cancer 09/26/2019   Gastroesophageal reflux disease without esophagitis 12/14/2018   GERD (gastroesophageal reflux disease)    Kidney stones    Major depressive disorder, recurrent episode, moderate (HCC) 08/23/2018   Menorrhagia with irregular cycle 04/01/2021   Mild intermittent asthma 09/26/2019   Mixed hyperlipidemia 09/26/2019   Pain of right upper extremity 10/23/2020   Pelvic organ prolapse quantification stage 1 cystocele 04/01/2021   Status post hysteroscopy 05/21/2021   Past Surgical History:  Procedure Laterality Date   HERNIA REPAIR  2014    Family History  Problem Relation Age of Onset   Breast cancer Mother    Leukemia Father    Heart failure Brother    Alcohol abuse Brother    Social History   Socioeconomic History   Marital status: Widowed    Spouse name: Not on file   Number of children: 1   Years of education: Not on file   Highest education level: Not on file  Occupational History   Occupation: [employer]  Tobacco Use   Smoking status: Former    Current packs/day: 0.00    Average packs/day: 1 pack/day for 30.0 years (30.0 ttl pk-yrs)    Types: Cigarettes    Start date: 16    Quit date: 2012    Years since quitting: 13.0   Smokeless tobacco: Never  Vaping Use   Vaping status: Never Used  Substance and Sexual Activity   Alcohol use: Yes    Alcohol/week: 2.0 standard drinks of alcohol    Types: 2 Glasses of wine per week    Comment: SOCIAL   Drug use: Never   Sexual activity: Not Currently  Other Topics Concern   Not on file  Social History Narrative   Not on file   Social Drivers of Health   Financial Resource Strain: Low Risk  (03/16/2023)   Overall Financial Resource Strain (CARDIA)    Difficulty of Paying Living Expenses: Not hard at all  Food Insecurity: No Food Insecurity (03/16/2023)   Hunger Vital Sign    Worried About Running Out of Food in the Last Year: Never true    Ran Out of Food in  the Last Year: Never true  Transportation Needs: No Transportation Needs (03/16/2023)   PRAPARE - Administrator, Civil Service (Medical): No    Lack of Transportation (Non-Medical): No  Physical Activity: Inactive (03/16/2023)   Exercise Vital Sign    Days of Exercise per Week: 0 days    Minutes of Exercise per Session: 0 min  Stress: No Stress Concern Present (03/16/2023)   [employer] of Occupational Health - Occupational Stress Questionnaire    Feeling of Stress : Not at all  Social Connections: Moderately Isolated (03/16/2023)   Social Connection and Isolation Panel [NHANES]    Frequency of Communication with Friends and Family: More than three times a week    Frequency of Social  Gatherings with Friends and Family: More than three times a week    Attends Religious Services: More than 4 times per year    Active Member of Clubs or Organizations: No    Attends Banker Meetings: Never    Marital Status: Widowed    CONSTITUTIONAL: Negative for chills, fatigue, fever, unintentional weight gain and unintentional weight loss.  E/N/T: Negative for ear pain, nasal congestion and sore throat.  CARDIOVASCULAR: Negative for chest pain, dizziness, palpitations and pedal edema however has increasing exertional dyspnea symptoms RESPIRATORY: see HPI GASTROINTESTINAL: Negative for abdominal pain, acid reflux symptoms, constipation, diarrhea, nausea and vomiting.  MSK: Negative for arthralgias and myalgias.  INTEGUMENTARY: Negative for rash.  NEUROLOGICAL: see HPI PSYCHIATRIC: Negative for sleep disturbance and to question depression screen.  Negative for depression, negative for anhedonia.           Objective:  PHYSICAL EXAM:   VS: BP 130/82 (BP Location: Left Arm, Patient Position: Sitting, Cuff Size: Large)   Pulse 82   Temp (!) 97.2 F (36.2 C) (Temporal)   Resp 20   Ht 5' 5.5" (1.664 m)   Wt 286 lb (129.7 kg)   LMP  (LMP Unknown)   SpO2 97%   BMI 46.87  kg/m   GEN: Well nourished, well developed, in no acute distress  HEENT: normal external ears and nose - normal external auditory canals and TMS - - Lips, Teeth and Gums - normal  Oropharynx - normal mucosa, palate, and posterior pharynx Neck: no JVD or masses - no thyromegaly Cardiac: RRR; no murmurs, rubs, or gallops,no edema -  Respiratory:  normal respiratory rate and pattern with no distress - normal breath sounds with no rales, rhonchi, wheezes or rubs GI: normal bowel sounds, no masses or tenderness Skin: warm and dry, no rash  Psych: euthymic mood, appropriate affect and demeanor  EKG - normal Lab Results  Component Value Date   WBC 11.8 (H) 03/16/2023   HGB 13.3 03/16/2023   HCT 42.2 03/16/2023   PLT 241 03/16/2023   GLUCOSE 94 03/16/2023   CHOL 139 03/16/2023   TRIG 132 03/16/2023   HDL 49 03/16/2023   LDLCALC 67 03/16/2023   ALT 18 03/16/2023   AST 18 03/16/2023   NA 140 03/16/2023   K 4.4 03/16/2023   CL 99 03/16/2023   CREATININE 0.68 03/16/2023   BUN 11 03/16/2023   CO2 26 03/16/2023   TSH 2.130 03/16/2023   HGBA1C 6.7 (H) 03/16/2023      Assessment & Plan:   Problem List Items Addressed This Visit       Digestive   Gastroesophageal reflux disease without esophagitis Continue current meds     Endocrine   Acquired hypothyroidism - Primary Continue meds TSH pending     Other   Mixed hyperlipidemia   Relevant Orders   CBC with Differential/Platelet   Comprehensive metabolic panel   Lipid panel Watch diet Continue crestor 10mg  qd  COPD Continue breztri Change to [employer] in place of ventolin Recommend stop smoking  Exertional dyspnea with abnormal stress test Refer back to cardiology for further evaluation    Anxiety Continue current meds  Breast cancer screening  Mammogram ordered  Migraine without aura Continue relpax Begin Toprol XL 25mg  qd (for prevention, slightly elevated bp and also until cardiac testing done)    Type 2  diabetes mellitus with hyperglycemia, without long-term current use of insulin (HCC)       Relevant Orders  CBC with Differential/Platelet   Comprehensive metabolic panel   Lipid panel   Hemoglobin A1c Low sugar/low carb diet Efforts at weight loss  Colon cancer screening Cologuard ordered      .  Meds ordered this encounter  Medications   metoprolol succinate (TOPROL-XL) 25 MG 24 hr tablet    Sig: Take 1 tablet (25 mg total) by mouth daily.    Dispense:  90 tablet    Refill:  0    Supervising Provider:   COX, Aniceto Boss   Budeson-Glycopyrrol-Formoterol (BREZTRI AEROSPHERE) 160-9-4.8 MCG/ACT AERO    Sig: Take 2 puffs by mouth in the morning and at bedtime.    Dispense:  11 g    Refill:  5    Supervising Provider:   COX, Aniceto Boss   eletriptan (RELPAX) 20 MG tablet    Sig: Take 1 tablet (20 mg total) by mouth as needed for migraine or headache. May repeat in 2 hours if headache persists or recurs.    Dispense:  10 tablet    Refill:  2    Supervising Provider:   COX, KIRSTEN [[id number]]   LORazepam (ATIVAN) 0.5 MG tablet    Sig: Take 1 tablet (0.5 mg total) by mouth daily as needed for anxiety.    Dispense:  30 tablet    Refill:  0    Supervising Provider:   COX, Aniceto Boss   Albuterol-Budesonide (AIRSUPRA) 90-80 MCG/ACT AERO    Sig: Inhale 2 puffs into the lungs 4 (four) times daily as needed.    Dispense:  10.7 g    Refill:  5    Supervising Provider:   Blane Ohara [phone]     Orders Placed This Encounter  Procedures   MM 3D SCREENING MAMMOGRAM BILATERAL BREAST   CBC with Differential/Platelet   Comprehensive metabolic panel   TSH   Lipid panel   Hemoglobin A1c   VITAMIN D 25 Hydroxy (Vit-D Deficiency, Fractures)   Cologuard   EKG 12-Lead     Follow-up: Return in about 3 months (around 12/15/2023) for chronic fasting follow-up.  An After Visit Summary was printed and given to the patient.  Jettie Pagan [facility] [phone]

## 2023-09-17 ENCOUNTER — Encounter: Payer: Self-pay | Admitting: Physician Assistant

## 2023-09-17 ENCOUNTER — Other Ambulatory Visit: Payer: Self-pay | Admitting: Physician Assistant

## 2023-09-17 DIAGNOSIS — E559 Vitamin D deficiency, unspecified: Secondary | ICD-10-CM

## 2023-09-17 LAB — CBC WITH DIFFERENTIAL/PLATELET
Basophils Absolute: 0.1 10*3/uL (ref 0.0–0.2)
Basos: 1 %
EOS (ABSOLUTE): 0.4 10*3/uL (ref 0.0–0.4)
Eos: 4 %
Hematocrit: 45.4 % (ref 34.0–46.6)
Hemoglobin: 13.8 g/dL (ref 11.1–15.9)
Immature Grans (Abs): 0 10*3/uL (ref 0.0–0.1)
Immature Granulocytes: 0 %
Lymphocytes Absolute: 2.6 10*3/uL (ref 0.7–3.1)
Lymphs: 25 %
MCH: 26.1 pg — ABNORMAL LOW (ref 26.6–33.0)
MCHC: 30.4 g/dL — ABNORMAL LOW (ref 31.5–35.7)
MCV: 86 fL (ref 79–97)
Monocytes Absolute: 0.7 10*3/uL (ref 0.1–0.9)
Monocytes: 6 %
Neutrophils Absolute: 6.6 10*3/uL (ref 1.4–7.0)
Neutrophils: 64 %
Platelets: 270 10*3/uL (ref 150–450)
RBC: 5.28 x10E6/uL (ref 3.77–5.28)
RDW: 13.2 % (ref 11.7–15.4)
WBC: 10.3 10*3/uL (ref 3.4–10.8)

## 2023-09-17 LAB — VITAMIN D 25 HYDROXY (VIT D DEFICIENCY, FRACTURES): Vit D, 25-Hydroxy: 6.5 ng/mL — ABNORMAL LOW (ref 30.0–100.0)

## 2023-09-17 LAB — COMPREHENSIVE METABOLIC PANEL
ALT: 20 [IU]/L (ref 0–32)
AST: 20 [IU]/L (ref 0–40)
Albumin: 4.2 g/dL (ref 3.8–4.9)
Alkaline Phosphatase: 153 [IU]/L — ABNORMAL HIGH (ref 44–121)
BUN/Creatinine Ratio: 17 (ref 9–23)
BUN: 10 mg/dL (ref 6–24)
Bilirubin Total: 0.2 mg/dL (ref 0.0–1.2)
CO2: 25 mmol/L (ref 20–29)
Calcium: 9.7 mg/dL (ref 8.7–10.2)
Chloride: 99 mmol/L (ref 96–106)
Creatinine, Ser: 0.59 mg/dL (ref 0.57–1.00)
Globulin, Total: 2.8 g/dL (ref 1.5–4.5)
Glucose: 114 mg/dL — ABNORMAL HIGH (ref 70–99)
Potassium: 5 mmol/L (ref 3.5–5.2)
Sodium: 142 mmol/L (ref 134–144)
Total Protein: 7 g/dL (ref 6.0–8.5)
eGFR: 106 mL/min/{1.73_m2} (ref >59)

## 2023-09-17 LAB — LIPID PANEL
Chol/HDL Ratio: 2.9 {ratio} (ref 0.0–4.4)
Cholesterol, Total: 158 mg/dL (ref 100–199)
HDL: 55 mg/dL (ref >39)
LDL Chol Calc (NIH): 78 mg/dL (ref 0–99)
Triglycerides: 145 mg/dL (ref 0–149)
VLDL Cholesterol Cal: 25 mg/dL (ref 5–40)

## 2023-09-17 LAB — HEMOGLOBIN A1C
Est. average glucose Bld gHb Est-mCnc: 151 mg/dL
Hgb A1c MFr Bld: 6.9 % — ABNORMAL HIGH (ref 4.8–5.6)

## 2023-09-17 LAB — TSH: TSH: 1.35 u[IU]/mL (ref 0.450–4.500)

## 2023-09-17 MED ORDER — VITAMIN D (ERGOCALCIFEROL) 1.25 MG (50000 UNIT) PO CAPS: 50000.0000 [IU] | ORAL_CAPSULE | ORAL | 5 refills | Status: DC

## 2023-09-19 NOTE — Progress Notes (Unsigned)
Cardiology Office Note:    Date:  09/20/2023   ID:  Kristen Vazquez, DOB March 03, 1966, MRN [id number]  PCP:  Marianne Sofia, PA-C  Cardiologist:  Norman Herrlich, MD    Referring MD: Marianne Sofia, PA-C    ASSESSMENT:    1. Coronary artery calcification seen on CT scan   2. Abnormal myocardial perfusion study   3. Chronic obstructive pulmonary disease, unspecified COPD type (HCC)    PLAN:    In order of problems listed above:  I think there is good enough reason to move ahead and do cardiac CTA to certainly sort out if she has severe obstructive CAD and guide treatment. Continue her high intensity statin with coronary artery calcification Although this could be due to COPD however I think cardiac CTA will give the judgment and guide her treatment.   Next appointment: 3 months   Medication Adjustments/Labs and Tests Ordered: Current medicines are reviewed at length with the patient today.  Concerns regarding medicines are outlined above.  No orders of the defined types were placed in this encounter.  No orders of the defined types were placed in this encounter.    History of Present Illness:    Kristen Vazquez is a 58 y.o. female with a hx of coronary artery calcification seen on CT scan lung cancer screening COPD hyperlipidemia abnormal myocardial perfusion study mildly abnormal very limited ischemia when the patient was seen in September she opted for conservative management with ongoing medical therapy including aspirin and high intensity statin last seen 08/17/2023.  Seen with her PCP 09/16/2023 referred back with a very strong recommendation for further evaluation concerned that shortness of breath is not related to COPD and may need further evaluation and treatment for CAD.  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  She is also  short of breath when she climbs a flight of stairs although she is not coughing and wheezing at rest she says with physical activity she does wheeze since her visit with her primary care physician she has been a little bit concerned and she has had some vague chest tightness she wonders if it is anxiety.  She did not have any palpitations syncope edema orthopnea Past Medical History:  Diagnosis Date   Abnormal blood chemistry 01/02/2020   Acquired hypothyroidism 12/14/2018   Acute laryngopharyngitis 01/02/2020   Acute pain of right shoulder due to trauma 10/23/2020   Acute sinusitis 08/19/2021   Anxiety 04/09/2020   Asthma    Controlled type 2 diabetes mellitus without complication, without long-term current use of insulin (HCC) 03/30/2023   COPD (chronic obstructive pulmonary disease) (HCC)    Depression    Dyspnea on exertion 03/30/2023   Encounter for screening mammogram for breast cancer 09/26/2019   Gastroesophageal reflux disease without esophagitis 12/14/2018   GERD (gastroesophageal reflux disease)    Kidney stones    Major depressive disorder, recurrent episode, moderate (HCC) 08/23/2018   Menorrhagia with irregular cycle 04/01/2021   Mild intermittent asthma 09/26/2019   Mixed hyperlipidemia 09/26/2019   Pain of right upper extremity 10/23/2020   Pelvic organ prolapse quantification stage 1 cystocele 04/01/2021   Status post hysteroscopy 05/21/2021    Current Medications: Current Meds  Medication Sig   Albuterol-Budesonide (AIRSUPRA) 90-80 MCG/ACT AERO Inhale 2 puffs into the lungs 4 (four) times daily as needed.   aspirin EC  81 MG tablet Take 1 tablet (81 mg total) by mouth daily. Swallow whole.   Budeson-Glycopyrrol-Formoterol (BREZTRI AEROSPHERE) 160-9-4.8 MCG/ACT AERO Take 2 puffs by mouth in the morning and at bedtime.   eletriptan (RELPAX) 20 MG tablet Take 1 tablet (20 mg total) by mouth as needed for migraine or headache. May repeat in 2 hours if headache persists  or recurs.   escitalopram (LEXAPRO) 20 MG tablet Take 1 tablet by mouth once daily   fluticasone (FLONASE) 50 MCG/ACT nasal spray Place 2 sprays into both nostrils daily.   levothyroxine (SYNTHROID) 125 MCG tablet Take 1 tablet by mouth once daily   LORazepam (ATIVAN) 0.5 MG tablet Take 1 tablet (0.5 mg total) by mouth daily as needed for anxiety.   metoprolol succinate (TOPROL-XL) 25 MG 24 hr tablet Take 1 tablet (25 mg total) by mouth daily.   nitroGLYCERIN (NITROSTAT) 0.4 MG SL tablet Place 1 tablet (0.4 mg total) under the tongue every 5 (five) minutes as needed for chest pain.   omeprazole (PRILOSEC) 20 MG capsule Take 20 mg by mouth daily.   rosuvastatin (CRESTOR) 10 MG tablet Take 1 tablet by mouth once daily   Vitamin D, Ergocalciferol, (DRISDOL) 1.25 MG (50000 UNIT) CAPS capsule Take 1 capsule (50,000 Units total) by mouth every 7 (seven) days.      EKGs/Labs/Other Studies Reviewed:    The following studies were reviewed today:  Cardiac Studies & Procedures     STRESS TESTS  MYOCARDIAL PERFUSION IMAGING 05/05/2023  Narrative   Findings are consistent with mild ischemia involving basal and mid portion of the antero - septal wall. The study is low risk.   up sloping ST depression in the inferolateral leads (II, III, aVF, V5 and V6) was noted.   Left ventricular function is normal. Nuclear stress EF: 69%. The left ventricular ejection fraction is hyperdynamic (>65%). End diastolic cavity size is normal.                  Recent Labs: 09/16/2023: ALT 20; BUN 10; Creatinine, Ser 0.59; Hemoglobin 13.8; Platelets 270; Potassium 5.0; Sodium 142; TSH 1.350  Recent Lipid Panel    Component Value Date/Time   CHOL 158 09/16/2023 1014   TRIG 145 09/16/2023 1014   HDL 55 09/16/2023 1014   CHOLHDL 2.9 09/16/2023 1014   LDLCALC 78 09/16/2023 1014    Physical Exam:    VS:  BP (!) 142/74   Pulse 99   Ht 5\' 5"  (1.651 m)   Wt 284 lb 12.8 oz (129.2 kg)   LMP  (LMP Unknown)   SpO2  99%   BMI 47.39 kg/m     Wt Readings from Last 3 Encounters:  09/20/23 284 lb 12.8 oz (129.2 kg)  09/16/23 286 lb (129.7 kg)  08/17/23 282 lb 12.8 oz (128.3 kg)     GEN: Obese Well nourished, well developed in no acute distress HEENT: Normal NECK: No JVD; No carotid bruits LYMPHATICS: No lymphadenopathy CARDIAC: Distant heart sounds RRR, no murmurs, rubs, gallops RESPIRATORY:  Clear to auscultation without rales, wheezing or rhonchi diminished breath sounds ABDOMEN: Soft, non-tender, non-distended MUSCULOSKELETAL:  No edema; No deformity  SKIN: Warm and dry NEUROLOGIC:  Alert and oriented x 3 PSYCHIATRIC:  Normal affect    Signed, Norman Herrlich, MD  09/20/2023 2:24 PM    [facility]

## 2023-09-20 ENCOUNTER — Ambulatory Visit: Payer: BC Managed Care – PPO | Attending: Cardiology | Admitting: Cardiology

## 2023-09-20 ENCOUNTER — Other Ambulatory Visit: Payer: Self-pay

## 2023-09-20 ENCOUNTER — Encounter: Payer: Self-pay | Admitting: Cardiology

## 2023-09-20 VITALS — BP 142/74 | HR 99 | Ht 65.0 in | Wt 284.8 lb

## 2023-09-20 DIAGNOSIS — I2510 Atherosclerotic heart disease of native coronary artery without angina pectoris: Secondary | ICD-10-CM | POA: Diagnosis not present

## 2023-09-20 DIAGNOSIS — J449 Chronic obstructive pulmonary disease, unspecified: Secondary | ICD-10-CM

## 2023-09-20 DIAGNOSIS — R9439 Abnormal result of other cardiovascular function study: Secondary | ICD-10-CM

## 2023-09-20 MED ORDER — METOPROLOL TARTRATE 100 MG PO TABS: 100.0000 mg | ORAL_TABLET | Freq: Once | ORAL | 0 refills | Status: DC

## 2023-09-20 NOTE — Patient Instructions (Addendum)
Medication Instructions:  Your physician recommends that you continue on your current medications as directed. Please refer to the Current Medication list given to you today.  *If you need a refill on your cardiac medications before your next appointment, please call your pharmacy*   Lab Work: Your physician recommends that you return for lab work in:   Labs today: BMP  If you have labs (blood work) drawn today and your tests are completely normal, you will receive your results only by: MyChart Message (if you have MyChart) OR A paper copy in the mail If you have any lab test that is abnormal or we need to change your treatment, we will call you to review the results.   Testing/Procedures:   Your cardiac CT will be scheduled at one of the below locations:   [facility] [address] [place], [place] [address] [phone]  OR  [facility] [address] [place], [place] [address] [phone]  OR   [facility] [address] [place], [place] [address] [phone]  OR   [facility] [address] [place], [place] [address] [phone]  If scheduled at [facility], please arrive at the [facility] Entrance (Entrance C2) of [facility] 30 minutes prior to test start time. You can use the FREE valet parking offered at entrance C (encouraged to control the heart rate for the test)  Proceed to the [facility] Radiology Department (first floor) to check-in and test prep.  All radiology patients and guests should use entrance C2 at [facility], accessed from [facility], even though the hospital's physical address listed is [address]    If scheduled at [facility] or [facility], please arrive 15 mins early for check-in and test prep.  There is spacious parking  and easy access to the radiology department from the [facility] entrance. Please enter here and check-in with the desk attendant.   If scheduled at [facility], please arrive 30 minutes early for check-in and test prep.  Please follow these instructions carefully (unless otherwise directed):  An IV will be required for this test and Nitroglycerin will be given.  Hold all erectile dysfunction medications at least 3 days (72 hrs) prior to test. (Ie viagra, cialis, sildenafil, tadalafil, etc)   On the Night Before the Test: Be sure to Drink plenty of water. Do not consume any caffeinated/decaffeinated beverages or chocolate 12 hours prior to your test. Do not take any antihistamines 12 hours prior to your test.  On the Day of the Test: Drink plenty of water until 1 hour prior to the test. Do not eat any food 1 hour prior to test. You may take your regular medications prior to the test.  Take metoprolol (Lopressor) two hours prior to test. Patients who wear a continuous glucose monitor MUST remove the device prior to scanning. FEMALES- please wear underwire-free bra if available, avoid dresses & tight clothing      After the Test: Drink plenty of water. After receiving IV contrast, you may experience a mild flushed feeling. This is normal. On occasion, you may experience a mild rash up to 24 hours after the test. This is not dangerous. If this occurs, you can take Benadryl 25 mg, Zyrtec, Claritin, or Allegra and increase your fluid intake. (Patients taking Tikosyn should avoid Benadryl, and may take  Zyrtec, Claritin, or Allegra) If you experience trouble breathing, this can be serious. If it is severe call 911 IMMEDIATELY. If it is mild, please call our office.  We will call to schedule your test 2-4 weeks out understanding that some insurance companies will need an authorization prior to the service being performed.   For more information and frequently asked  questions, please visit our website : [email or web address]  For non-scheduling related questions, please contact the cardiac imaging nurse navigator should you have any questions/concerns: Cardiac Imaging Nurse Navigators Direct Office Dial: [phone]   For scheduling needs, including cancellations and rescheduling, please call [place], [phone].    Follow-Up: At [facility], you and your health needs are our priority.  As part of our continuing mission to provide you with exceptional heart care, we have created designated Provider Care Teams.  These Care Teams include your primary Cardiologist (physician) and Advanced Practice Providers (APPs -  Physician Assistants and Nurse Practitioners) who all work together to provide you with the care you need, when you need it.  We recommend signing up for the patient portal called "MyChart".  Sign up information is provided on this After Visit Summary.  MyChart is used to connect with patients for Virtual Visits (Telemedicine).  Patients are able to view lab/test results, encounter notes, upcoming appointments, etc.  Non-urgent messages can be sent to your provider as well.   To learn more about what you can do with MyChart, go to [email or web address].    Your next appointment:   3 month(s)  Provider:   Norman Herrlich, MD    Other Instructions None

## 2023-09-20 NOTE — Addendum Note (Signed)
Addended by: Roxanne Mins I on: 09/20/2023 03:36 PM   Modules accepted: Orders

## 2023-09-21 LAB — BASIC METABOLIC PANEL
BUN/Creatinine Ratio: 19 (ref 9–23)
BUN: 11 mg/dL (ref 6–24)
CO2: 25 mmol/L (ref 20–29)
Calcium: 9.2 mg/dL (ref 8.7–10.2)
Chloride: 104 mmol/L (ref 96–106)
Creatinine, Ser: 0.59 mg/dL (ref 0.57–1.00)
Glucose: 152 mg/dL — ABNORMAL HIGH (ref 70–99)
Potassium: 4.1 mmol/L (ref 3.5–5.2)
Sodium: 143 mmol/L (ref 134–144)
eGFR: 106 mL/min/{1.73_m2} (ref >59)

## 2023-10-08 ENCOUNTER — Encounter (HOSPITAL_COMMUNITY): Payer: Self-pay

## 2023-10-12 ENCOUNTER — Ambulatory Visit (HOSPITAL_BASED_OUTPATIENT_CLINIC_OR_DEPARTMENT_OTHER)
Admission: RE | Admit: 2023-10-12 | Discharge: 2023-10-12 | Disposition: A | Discharge status: Home / Self Care | Payer: BC Managed Care – PPO | Source: Ambulatory Visit | Attending: Cardiovascular Disease | Admitting: Cardiovascular Disease

## 2023-10-12 ENCOUNTER — Ambulatory Visit (HOSPITAL_COMMUNITY)
Admission: RE | Admit: 2023-10-12 | Discharge: 2023-10-12 | Disposition: A | Discharge status: Home / Self Care | Payer: BC Managed Care – PPO | Source: Ambulatory Visit | Attending: Cardiology | Admitting: Cardiology

## 2023-10-12 ENCOUNTER — Other Ambulatory Visit: Payer: Self-pay | Admitting: Cardiovascular Disease

## 2023-10-12 DIAGNOSIS — I2510 Atherosclerotic heart disease of native coronary artery without angina pectoris: Secondary | ICD-10-CM | POA: Diagnosis not present

## 2023-10-12 DIAGNOSIS — J449 Chronic obstructive pulmonary disease, unspecified: Secondary | ICD-10-CM | POA: Diagnosis present

## 2023-10-12 DIAGNOSIS — R931 Abnormal findings on diagnostic imaging of heart and coronary circulation: Secondary | ICD-10-CM

## 2023-10-12 DIAGNOSIS — R9439 Abnormal result of other cardiovascular function study: Secondary | ICD-10-CM | POA: Insufficient documentation

## 2023-10-12 MED ORDER — NITROGLYCERIN 0.4 MG SL SUBL
SUBLINGUAL_TABLET | SUBLINGUAL | Status: AC
  Filled 2023-10-12: qty 2

## 2023-10-12 MED ORDER — NITROGLYCERIN 0.4 MG SL SUBL
0.8000 mg | SUBLINGUAL_TABLET | Freq: Once | SUBLINGUAL | Status: AC
  Administered 2023-10-12: 0.8 mg via SUBLINGUAL

## 2023-10-12 MED ORDER — DILTIAZEM HCL 25 MG/5ML IV SOLN: 10.0000 mg | INTRAVENOUS | Status: DC | PRN

## 2023-10-12 MED ORDER — IOHEXOL 350 MG/ML SOLN
95.0000 mL | Freq: Once | INTRAVENOUS | Status: AC | PRN
  Administered 2023-10-12: 95 mL via INTRAVENOUS

## 2023-10-12 MED ORDER — METOPROLOL TARTRATE 5 MG/5ML IV SOLN: 10.0000 mg | Freq: Once | INTRAVENOUS | Status: DC | PRN

## 2023-10-13 ENCOUNTER — Other Ambulatory Visit: Payer: Self-pay | Admitting: Physician Assistant

## 2023-10-13 DIAGNOSIS — J438 Other emphysema: Secondary | ICD-10-CM

## 2023-10-22 ENCOUNTER — Other Ambulatory Visit: Payer: Self-pay | Admitting: Physician Assistant

## 2023-10-22 DIAGNOSIS — F331 Major depressive disorder, recurrent, moderate: Secondary | ICD-10-CM

## 2023-11-03 ENCOUNTER — Other Ambulatory Visit: Payer: Self-pay | Admitting: Physician Assistant

## 2023-11-03 ENCOUNTER — Telehealth: Payer: Self-pay

## 2023-11-03 DIAGNOSIS — E039 Hypothyroidism, unspecified: Secondary | ICD-10-CM

## 2023-11-03 NOTE — Telephone Encounter (Signed)
-----   Message from Marlyn Corporal Madireddy sent at 11/03/2023  5:14 PM EDT ----- Dr. Hulen Shouts patient.  58 year old.  Abnormal stress test. Cardiac CT from February abnormal for moderate to severe stenosis. CT FFR abnormal. PCP called with regards to ongoing symptoms and question about cath if needed.  Please schedule for appointment in the office ASAP with [place] of [place].  Thank you

## 2023-11-03 NOTE — Telephone Encounter (Signed)
 Appointment made for 3/19 with Dr. Vincent Gros

## 2023-11-04 ENCOUNTER — Other Ambulatory Visit: Payer: Self-pay

## 2023-11-04 ENCOUNTER — Ambulatory Visit

## 2023-11-04 VITALS — BP 140/86 | HR 91 | Ht 65.6 in | Wt 283.6 lb

## 2023-11-04 DIAGNOSIS — I25118 Atherosclerotic heart disease of native coronary artery with other forms of angina pectoris: Secondary | ICD-10-CM | POA: Diagnosis not present

## 2023-11-04 DIAGNOSIS — I2510 Atherosclerotic heart disease of native coronary artery without angina pectoris: Secondary | ICD-10-CM | POA: Insufficient documentation

## 2023-11-04 DIAGNOSIS — I10 Essential (primary) hypertension: Secondary | ICD-10-CM | POA: Insufficient documentation

## 2023-11-04 DIAGNOSIS — E782 Mixed hyperlipidemia: Secondary | ICD-10-CM | POA: Diagnosis not present

## 2023-11-04 HISTORY — DX: Atherosclerotic heart disease of native coronary artery without angina pectoris: I25.10

## 2023-11-04 HISTORY — DX: Essential (primary) hypertension: I10

## 2023-11-04 MED ORDER — AMLODIPINE BESYLATE 2.5 MG PO TABS: 2.5000 mg | ORAL_TABLET | Freq: Every day | ORAL | 3 refills | Status: DC

## 2023-11-04 NOTE — Assessment & Plan Note (Signed)
 Suboptimal. Target below 130/80 mmHg. Continue metoprolol XL 25 mg once daily. Start amlodipine 2.5 mg once daily.

## 2023-11-04 NOTE — Progress Notes (Signed)
 Cardiology Consultation:    Date:  11/04/2023   ID:  Kristen Vazquez, DOB [birth year], MRN [id number]  PCP:  Marianne Sofia, PA-C  Cardiologist:  Marlyn Corporal Tammee Thielke, MD   Referring MD: Marianne Sofia, PA-C   No chief complaint on file.    ASSESSMENT AND PLAN:   Ms. Bina 58 year old woman with history of with significant lesions in LAD possible with significant lesions in LAD territory, with progressive symptoms of dyspnea on exertion suggestive of angina, has history of COPD, hyperlipidemia, hypothyroidism.   Problem List Items Addressed This Visit     Mixed hyperlipidemia   Continue rosuvastatin 10 mg once daily. Will consider escalating after the cath at subsequent follow-up visit.      CAD (coronary artery disease) - Primary   Symptoms of dyspnea on exertion suggestive of angina and progressive over the past 2 to 3 months. Prior abnormal stress test with nuclear imaging from September 2024 Abnormal cardiac CT imaging on 10-12-2023 with CAD RADS 3 study moderate 50 to 69% lesion of LAD and RCA with CT FFR abnormal for possible hemodynamically significant lesions in mid to distal LAD. Calcium score 526 and total plaque volume 792 mm cube.  In the setting of abnormal findings and symptoms discussed further evaluation with cardiac catheterization and possible revascularization with stenting if necessary.  Discussed the procedure with her at length. Shared Decision Making/Informed Consent{ The risks [stroke (1 in 1000), death (1 in 1000), kidney failure [usually temporary] (1 in 500), bleeding (1 in 200), allergic reaction [possibly serious] (1 in 200)], benefits (diagnostic support and management of coronary artery disease) and alternatives of a cardiac catheterization were discussed in detail with her and she is willing to proceed.  Proceed with cardiac catheterization, tentatively to be scheduled at [facility]. She is aware of need to have someone to drive her back  home.  She has extended family members from her husband's side and friends who can help her.  She understood and she will have to hold off on lifting heavy weights for 3 to 4 days postprocedure [presuming radial access]. She will require a leave of absence from her work and will let us know if she needs any additional paperwork. Subsequent release to work based on cath results and course.  Continue with aspirin 81 mg once daily Continue with rosuvastatin 10 mg once daily. Continue metoprolol succinate 25 mg once daily. Escalate antianginal regimen with addition of amlodipine 2.5 mg once daily.        Relevant Orders   EKG 12-Lead (Completed)   Hypertension   Suboptimal. Target below 130/80 mmHg. Continue metoprolol XL 25 mg once daily. Start amlodipine 2.5 mg once daily.       Return to clinic tentatively in 2 to 3 weeks post cath.    History of Present Illness:    Kristen Vazquez is a 58 y.o. female who is being seen today for follow-up visit. PCP is Marianne Sofia, PA-C.  Last visit with [place] in the office was 09-20-2023 with Dr. Dulce Sellar. Pleasant woman lives by herself.  Her husband passed away a few years ago.  She works at [employer] in [place] and her job involves lifting heavy objects.  She is history of coronary atherosclerosis noted on prior lung cancer screening CT chest and subsequently abnormal mild perfusion study, preferred conservative management, related with ongoing symptoms had cardiac CT study done and now with abnormal cardiac CT imaging as below here for follow-up visit. Also has history  of COPD, hyperlipidemia, hypothyroidism.  Cardiac CT coronary angiogram from 10-12-2023 noted elevated calcium score 526, probable volume 792 mm cube suggest extensive atherosclerosis, CAD RADS3 study with moderate 50 to 69% lesion of RCA and LAD.  CT FFR abnormal for possible hemodynamically significant lesions in mid to distal LAD.  Mentions she does not have any chest pain but  feels short of breath with exertion and seems like Has not been progressing over the past 2 to 3 months.  No symptoms at rest.  Symptoms of shortness of breath with exertion relieved typically within few minutes after resting.  Mentions blood pressures at home have been relatively uncontrolled.  Denies any orthopnea.  Denies any paroxysmal nocturnal dyspnea. Denies any pedal edema. Denies any palpitations, lightheadedness, dizziness or syncopal episodes.  Denies smoking, alcohol or drug use.  EKG in the clinic today shows sinus rhythm heart rate 91/min, PR interval normal 1 452 ms, nonspecific ST-T changes notably in inferolateral leads.  Past Medical History:  Diagnosis Date   Abnormal blood chemistry 01/02/2020   Acquired hypothyroidism 12/14/2018   Acute laryngopharyngitis 01/02/2020   Acute pain of right shoulder due to trauma 10/23/2020   Acute sinusitis 08/19/2021   Anxiety 04/09/2020   Asthma    Controlled type 2 diabetes mellitus without complication, without long-term current use of insulin (HCC) 03/30/2023   COPD (chronic obstructive pulmonary disease) (HCC)    Depression    Dyspnea on exertion 03/30/2023   Encounter for screening mammogram for breast cancer 09/26/2019   Gastroesophageal reflux disease without esophagitis 12/14/2018   GERD (gastroesophageal reflux disease)    Kidney stones    Major depressive disorder, recurrent episode, moderate (HCC) 08/23/2018   Menorrhagia with irregular cycle 04/01/2021   Mild intermittent asthma 09/26/2019   Mixed hyperlipidemia 09/26/2019   Pain of right upper extremity 10/23/2020   Pelvic organ prolapse quantification stage 1 cystocele 04/01/2021   Status post hysteroscopy 05/21/2021    Past Surgical History:  Procedure Laterality Date   HERNIA REPAIR  2014    Current Medications: Current Meds  Medication Sig   Albuterol-Budesonide (AIRSUPRA) 90-80 MCG/ACT AERO Inhale 2 puffs into the lungs 4 (four) times daily as  needed.   aspirin EC 81 MG tablet Take 1 tablet (81 mg total) by mouth daily. Swallow whole.   Budeson-Glycopyrrol-Formoterol (BREZTRI AEROSPHERE) 160-9-4.8 MCG/ACT AERO INHALE 2 PUFFS IN THE MORNING AND AT BEDTIME   eletriptan (RELPAX) 20 MG tablet Take 1 tablet (20 mg total) by mouth as needed for migraine or headache. May repeat in 2 hours if headache persists or recurs.   escitalopram (LEXAPRO) 20 MG tablet Take 1 tablet by mouth once daily   fluticasone (FLONASE) 50 MCG/ACT nasal spray Place 2 sprays into both nostrils daily.   levothyroxine (SYNTHROID) 125 MCG tablet Take 1 tablet by mouth once daily   LORazepam (ATIVAN) 0.5 MG tablet Take 1 tablet (0.5 mg total) by mouth daily as needed for anxiety.   metoprolol succinate (TOPROL-XL) 25 MG 24 hr tablet Take 1 tablet (25 mg total) by mouth daily.   nitroGLYCERIN (NITROSTAT) 0.4 MG SL tablet Place 1 tablet (0.4 mg total) under the tongue every 5 (five) minutes as needed for chest pain.   omeprazole (PRILOSEC) 20 MG capsule Take 20 mg by mouth daily.   rosuvastatin (CRESTOR) 10 MG tablet Take 1 tablet by mouth once daily   Vitamin D, Ergocalciferol, (DRISDOL) 1.25 MG (50000 UNIT) CAPS capsule Take 1 capsule (50,000 Units total) by mouth every 7 (  seven) days.     Allergies:   Codeine, Sulfa antibiotics, and Coconut (cocos nucifera)   Social History   Socioeconomic History   Marital status: Widowed    Spouse name: Not on file   Number of children: 1   Years of education: Not on file   Highest education level: Not on file  Occupational History   Occupation: [employer] consultant [employer]  Tobacco Use   Smoking status: Former    Current packs/day: 0.00    Average packs/day: 1 pack/day for 30.0 years (30.0 ttl pk-yrs)    Types: Cigarettes    Start date: 61    Quit date: 2012    Years since quitting: 13.2   Smokeless tobacco: Never  Vaping Use   Vaping status: Never Used  Substance and Sexual Activity   Alcohol use: Yes    Alcohol/week:  2.0 standard drinks of alcohol    Types: 2 Glasses of wine per week    Comment: SOCIAL   Drug use: Never   Sexual activity: Not Currently  Other Topics Concern   Not on file  Social History Narrative   Not on file   Social Drivers of Health   Financial Resource Strain: Low Risk  (03/16/2023)   Overall Financial Resource Strain (CARDIA)    Difficulty of Paying Living Expenses: Not hard at all  Food Insecurity: No Food Insecurity (03/16/2023)   Hunger Vital Sign    Worried About Running Out of Food in the Last Year: Never true    Ran Out of Food in the Last Year: Never true  Transportation Needs: No Transportation Needs (03/16/2023)   PRAPARE - Administrator, Civil Service (Medical): No    Lack of Transportation (Non-Medical): No  Physical Activity: Inactive (03/16/2023)   Exercise Vital Sign    Days of Exercise per Week: 0 days    Minutes of Exercise per Session: 0 min  Stress: No Stress Concern Present (03/16/2023)   [employer] of Occupational Health - Occupational Stress Questionnaire    Feeling of Stress : Not at all  Social Connections: Moderately Isolated (03/16/2023)   Social Connection and Isolation Panel [NHANES]    Frequency of Communication with Friends and Family: More than three times a week    Frequency of Social Gatherings with Friends and Family: More than three times a week    Attends Religious Services: More than 4 times per year    Active Member of [employer] or Organizations: No    Attends Banker Meetings: Never    Marital Status: Widowed     Family History: The patient's family history includes Alcohol abuse in her brother; Breast cancer in her mother; Heart failure in her brother; Leukemia in her father. ROS:   Please see the history of present illness.    All 14 point review of systems negative except as described per history of present illness.  EKGs/Labs/Other Studies Reviewed:    The following studies were reviewed  today:   EKG:  EKG Interpretation Date/Time:  Thursday November 04 2023 13:58:33 EDT Ventricular Rate:  91 PR Interval:  152 QRS Duration:  78 QT Interval:  350 QTC Calculation: 430 R Axis:   55  Text Interpretation: Normal sinus rhythm Nonspecific ST abnormality Abnormal ECG No previous ECGs available Confirmed by Huntley Dec reddy [phone]) on 11/04/2023 3:06:36 PM    Recent Labs: 09/16/2023: ALT 20; Hemoglobin 13.8; Platelets 270; TSH 1.350 09/20/2023: BUN 11; Creatinine, Ser 0.59; Potassium 4.1; Sodium  143  Recent Lipid Panel    Component Value Date/Time   CHOL 158 09/16/2023 1014   TRIG 145 09/16/2023 1014   HDL 55 09/16/2023 1014   CHOLHDL 2.9 09/16/2023 1014   LDLCALC 78 09/16/2023 1014    Physical Exam:    VS:  BP (!) 140/86   Pulse 91   Ht 5' 5.6" (1.666 m)   Wt 283 lb 9.6 oz (128.6 kg)   SpO2 94%   BMI 46.33 kg/m     Wt Readings from Last 3 Encounters:  11/04/23 283 lb 9.6 oz (128.6 kg)  09/20/23 284 lb 12.8 oz (129.2 kg)  09/16/23 286 lb (129.7 kg)     GENERAL:  Well nourished, well developed in no acute distress NECK: No JVD; No carotid bruits CARDIAC: RRR, S1 and S2 present, no murmurs, no rubs, no gallops CHEST:  Clear to auscultation without rales, wheezing or rhonchi  Extremities: No pitting pedal edema. Pulses bilaterally symmetric with radial 2+ and dorsalis pedis 2+ NEUROLOGIC:  Alert and oriented x 3  Medication Adjustments/Labs and Tests Ordered: Current medicines are reviewed at length with the patient today.  Concerns regarding medicines are outlined above.  Orders Placed This Encounter  Procedures   EKG 12-Lead   No orders of the defined types were placed in this encounter.   Signed, Cecille Amsterdam, MD, MPH, [facility]. 11/04/2023 3:30 PM    [facility]

## 2023-11-04 NOTE — Assessment & Plan Note (Signed)
 Symptoms of dyspnea on exertion suggestive of angina and progressive over the past 2 to 3 months. Prior abnormal stress test with nuclear imaging from September 2024 Abnormal cardiac CT imaging on 10-12-2023 with CAD RADS 3 study moderate 50 to 69% lesion of LAD and RCA with CT FFR abnormal for possible hemodynamically significant lesions in mid to distal LAD. Calcium score 526 and total plaque volume 792 mm cube.  In the setting of abnormal findings and symptoms discussed further evaluation with cardiac catheterization and possible revascularization with stenting if necessary.  Discussed the procedure with her at length. Shared Decision Making/Informed Consent{ The risks [stroke (1 in 1000), death (1 in 1000), kidney failure [usually temporary] (1 in 500), bleeding (1 in 200), allergic reaction [possibly serious] (1 in 200)], benefits (diagnostic support and management of coronary artery disease) and alternatives of a cardiac catheterization were discussed in detail with her and she is willing to proceed.  Proceed with cardiac catheterization, tentatively to be scheduled at [facility]. She is aware of need to have someone to drive her back home.  She has extended family members from her husband's side and friends who can help her.  She understood and she will have to hold off on lifting heavy weights for 3 to 4 days postprocedure [presuming radial access]. She will require a leave of absence from her work and will let us know if she needs any additional paperwork. Subsequent release to work based on cath results and course.  Continue with aspirin 81 mg once daily Continue with rosuvastatin 10 mg once daily. Continue metoprolol succinate 25 mg once daily. Escalate antianginal regimen with addition of amlodipine 2.5 mg once daily.

## 2023-11-04 NOTE — Patient Instructions (Signed)
 Medication Instructions:    Start amlodipine 2.5 mg once a day *If you need a refill on your cardiac medications before your next appointment, please call your pharmacy*   Lab Work: None Ordered If you have labs (blood work) drawn today and your tests are completely normal, you will receive your results only by: MyChart Message (if you have MyChart) OR A paper copy in the mail If you have any lab test that is abnormal or we need to change your treatment, we will call you to review the results.   Testing/Procedures:  [place] [facility] [place] [address] [place] [address] Dept: [phone] Loc: [phone]  PIERCE BIAGINI  11/04/2023  You are scheduled for a Cardiac Catheterization on Thursday, March 27 with Dr. Dr. Rosemary Holms.    1. Please arrive at the [facility] (Main Entrance A) at [facility]: [address] [place], [place] [address] at 8:30 AM (This time is 2 hour(s) before your procedure to ensure your preparation).   Free valet parking service is available. You will check in at ADMITTING. The support person will be asked to wait in the waiting room.  It is OK to have someone drop you off and come back when you are ready to be discharged.    Special note: Every effort is made to have your procedure done on time. Please understand that emergencies sometimes delay scheduled procedures.  2. Diet: Do not eat solid foods after midnight.  The patient may have clear liquids until 5am upon the day of the procedure.  3. Labs: You will need to have blood drawn today  4. Medication instructions in preparation for your procedure:   Contrast Allergy: No    On the morning of your procedure, take your Aspirin 81 mg and any morning medicines NOT listed above.  You may use sips of water.  5. Plan to go home the same day, you will only stay overnight if medically necessary. 6. Bring a  current list of your medications and current insurance cards. 7. You MUST have a responsible person to drive you home. 8. Someone MUST be with you the first 24 hours after you arrive home or your discharge will be delayed. 9. Please wear clothes that are easy to get on and off and wear slip-on shoes.  Thank you for allowing [place] to care for you!   -- [facility]  Echocardiogram An echocardiogram is a test that uses sound waves (ultrasound) to produce images of the heart. Images from an echocardiogram can provide important information about: Heart size and shape. The size and thickness and movement of your heart's walls. Heart muscle function and strength. Heart valve function or if you have stenosis. Stenosis is when the heart valves are too narrow. If blood is flowing backward through the heart valves (regurgitation). A tumor or infectious growth around the heart valves. Areas of heart muscle that are not working well because of poor blood flow or injury from a heart attack. Aneurysm detection. An aneurysm is a weak or damaged part of an artery wall. The wall bulges out from the normal force of blood pumping through the body. Tell a health care provider about: Any allergies you have. All medicines you are taking, including vitamins, herbs, eye drops, creams, and over-the-counter medicines. Any blood disorders you have. Any surgeries you have had. Any medical conditions you have. Whether you are  pregnant or may be pregnant. What are the risks? Generally, this is a safe test. However, problems may occur, including an allergic reaction to dye (contrast) that may be used during the test. What happens before the test? No specific preparation is needed. You may eat and drink normally. What happens during the test?  You will take off your clothes from the waist up and put on a hospital gown. Electrodes or electrocardiogram (ECG)patches may be placed on your  chest. The electrodes or patches are then connected to a device that monitors your heart rate and rhythm. You will lie down on a table for an ultrasound exam. A gel will be applied to your chest to help sound waves pass through your skin. A handheld device, called a transducer, will be pressed against your chest and moved over your heart. The transducer produces sound waves that travel to your heart and bounce back (or "echo" back) to the transducer. These sound waves will be captured in real-time and changed into images of your heart that can be viewed on a video monitor. The images will be recorded on a computer and reviewed by your health care provider. You may be asked to change positions or hold your breath for a short time. This makes it easier to get different views or better views of your heart. In some cases, you may receive contrast through an IV in one of your veins. This can improve the quality of the pictures from your heart. The procedure may vary among health care providers and hospitals. What can I expect after the test? You may return to your normal, everyday life, including diet, activities, and medicines, unless your health care provider tells you not to do that. Follow these instructions at home: It is up to you to get the results of your test. Ask your health care provider, or the department that is doing the test, when your results will be ready. Keep all follow-up visits. This is important. Summary An echocardiogram is a test that uses sound waves (ultrasound) to produce images of the heart. Images from an echocardiogram can provide important information about the size and shape of your heart, heart muscle function, heart valve function, and other possible heart problems. You do not need to do anything to prepare before this test. You may eat and drink normally. After the echocardiogram is completed, you may return to your normal, everyday life, unless your health care provider  tells you not to do that. This information is not intended to replace advice given to you by your health care provider. Make sure you discuss any questions you have with your health care provider. Document Revised: 04/16/2021 Document Reviewed: 03/26/2020 Elsevier Patient Education  2023 Elsevier Inc.      Follow-Up: At [facility], you and your health needs are our priority.  As part of our continuing mission to provide you with exceptional heart care, we have created designated Provider Care Teams.  These Care Teams include your primary Cardiologist (physician) and Advanced Practice Providers (APPs -  Physician Assistants and Nurse Practitioners) who all work together to provide you with the care you need, when you need it.  We recommend signing up for the patient portal called "MyChart".  Sign up information is provided on this After Visit Summary.  MyChart is used to connect with patients for Virtual Visits (Telemedicine).  Patients are able to view lab/test results, encounter notes, upcoming appointments, etc.  Non-urgent messages can be sent to your provider as  well.   To learn more about what you can do with MyChart, go to [email or web address].    Your next appointment:   3 week follow up

## 2023-11-04 NOTE — Assessment & Plan Note (Addendum)
 Continue rosuvastatin 10 mg once daily. Will consider escalating after the cath at subsequent follow-up visit.

## 2023-11-05 LAB — CBC
Hematocrit: 41.1 % (ref 34.0–46.6)
Hemoglobin: 12.9 g/dL (ref 11.1–15.9)
MCH: 27.4 pg (ref 26.6–33.0)
MCHC: 31.4 g/dL — ABNORMAL LOW (ref 31.5–35.7)
MCV: 87 fL (ref 79–97)
Platelets: 224 10*3/uL (ref 150–450)
RBC: 4.7 x10E6/uL (ref 3.77–5.28)
RDW: 14.1 % (ref 11.7–15.4)
WBC: 11.3 10*3/uL — ABNORMAL HIGH (ref 3.4–10.8)

## 2023-11-05 LAB — COMPREHENSIVE METABOLIC PANEL
ALT: 19 IU/L (ref 0–32)
AST: 14 IU/L (ref 0–40)
Albumin: 4.1 g/dL (ref 3.8–4.9)
Alkaline Phosphatase: 129 IU/L — ABNORMAL HIGH (ref 44–121)
BUN/Creatinine Ratio: 26 — ABNORMAL HIGH (ref 9–23)
BUN: 19 mg/dL (ref 6–24)
Bilirubin Total: <0.2 mg/dL (ref 0.0–1.2)
CO2: 26 mmol/L (ref 20–29)
Calcium: 9.2 mg/dL (ref 8.7–10.2)
Chloride: 104 mmol/L (ref 96–106)
Creatinine, Ser: 0.72 mg/dL (ref 0.57–1.00)
Globulin, Total: 2.5 g/dL (ref 1.5–4.5)
Glucose: 98 mg/dL (ref 70–99)
Potassium: 4.3 mmol/L (ref 3.5–5.2)
Sodium: 145 mmol/L — ABNORMAL HIGH (ref 134–144)
Total Protein: 6.6 g/dL (ref 6.0–8.5)
eGFR: 98 mL/min/{1.73_m2} (ref >59)

## 2023-11-09 ENCOUNTER — Telehealth: Payer: Self-pay | Admitting: *Deleted

## 2023-11-09 NOTE — Telephone Encounter (Signed)
 Cardiac Catheterization scheduled at [facility] for: Thursday November 11, 2023 10:30 AM Arrival time [facility] Main Entrance A at: 8:30 AM  Nothing to eat after midnight prior to procedure, clear liquids until 5 AM day of procedure.  Medication instructions: -Usual morning medications can be taken with sips of water including aspirin 81 mg.  Plan to go home the same day, you will only stay overnight if medically necessary.  You must have responsible adult to drive you home.  Someone must be with you the first 24 hours after you arrive home.  Reviewed procedure instructions with patient.

## 2023-11-11 ENCOUNTER — Other Ambulatory Visit: Payer: Self-pay

## 2023-11-11 ENCOUNTER — Other Ambulatory Visit (HOSPITAL_COMMUNITY): Payer: Self-pay

## 2023-11-11 ENCOUNTER — Ambulatory Visit (HOSPITAL_COMMUNITY)
Admission: RE | Admit: 2023-11-11 | Discharge: 2023-11-11 | Disposition: A | Discharge status: Home / Self Care | Attending: Cardiology | Admitting: Cardiology

## 2023-11-11 ENCOUNTER — Ambulatory Visit (HOSPITAL_COMMUNITY)
Admission: RE | Disposition: A | Discharge status: Home / Self Care | Payer: Self-pay | Source: Home / Self Care | Attending: Cardiology

## 2023-11-11 DIAGNOSIS — Z955 Presence of coronary angioplasty implant and graft: Secondary | ICD-10-CM

## 2023-11-11 DIAGNOSIS — R0609 Other forms of dyspnea: Secondary | ICD-10-CM | POA: Diagnosis not present

## 2023-11-11 DIAGNOSIS — R9389 Abnormal findings on diagnostic imaging of other specified body structures: Secondary | ICD-10-CM | POA: Diagnosis not present

## 2023-11-11 DIAGNOSIS — I2510 Atherosclerotic heart disease of native coronary artery without angina pectoris: Secondary | ICD-10-CM | POA: Diagnosis present

## 2023-11-11 DIAGNOSIS — R072 Precordial pain: Secondary | ICD-10-CM | POA: Diagnosis present

## 2023-11-11 DIAGNOSIS — Z8249 Family history of ischemic heart disease and other diseases of the circulatory system: Secondary | ICD-10-CM | POA: Diagnosis not present

## 2023-11-11 DIAGNOSIS — I25118 Atherosclerotic heart disease of native coronary artery with other forms of angina pectoris: Secondary | ICD-10-CM

## 2023-11-11 DIAGNOSIS — Z87891 Personal history of nicotine dependence: Secondary | ICD-10-CM | POA: Diagnosis not present

## 2023-11-11 HISTORY — PX: CORONARY PRESSURE/FFR STUDY: CATH118243

## 2023-11-11 HISTORY — PX: CORONARY STENT INTERVENTION: CATH118234

## 2023-11-11 HISTORY — PX: LEFT HEART CATH AND CORONARY ANGIOGRAPHY: CATH118249

## 2023-11-11 LAB — GLUCOSE, CAPILLARY: Glucose-Capillary: 117 mg/dL — ABNORMAL HIGH (ref 70–99)

## 2023-11-11 SURGERY — LEFT HEART CATH AND CORONARY ANGIOGRAPHY
Anesthesia: LOCAL

## 2023-11-11 MED ORDER — VERAPAMIL HCL 2.5 MG/ML IV SOLN
INTRAVENOUS | Status: DC | PRN
  Administered 2023-11-11: 10 mL via INTRA_ARTERIAL

## 2023-11-11 MED ORDER — MIDAZOLAM HCL 2 MG/2ML IJ SOLN
INTRAMUSCULAR | Status: AC
  Filled 2023-11-11: qty 2

## 2023-11-11 MED ORDER — SODIUM CHLORIDE 0.9 % WEIGHT BASED INFUSION
1.0000 mL/kg/h | INTRAVENOUS | Status: DC
Start: 2023-11-11 — End: 2023-11-11

## 2023-11-11 MED ORDER — VERAPAMIL HCL 2.5 MG/ML IV SOLN
INTRAVENOUS | Status: AC
  Filled 2023-11-11: qty 2

## 2023-11-11 MED ORDER — CLOPIDOGREL BISULFATE 300 MG PO TABS
ORAL_TABLET | ORAL | Status: AC
  Filled 2023-11-11: qty 2

## 2023-11-11 MED ORDER — PANTOPRAZOLE SODIUM 20 MG PO TBEC
20.0000 mg | DELAYED_RELEASE_TABLET | Freq: Every day | ORAL | 2 refills | Status: DC
  Filled 2023-11-11: qty 30, 30d supply, fill #0

## 2023-11-11 MED ORDER — SODIUM CHLORIDE 0.9 % IV SOLN: 250.0000 mL | INTRAVENOUS | Status: DC | PRN

## 2023-11-11 MED ORDER — FENTANYL CITRATE (PF) 100 MCG/2ML IJ SOLN
INTRAMUSCULAR | Status: AC
  Filled 2023-11-11: qty 2

## 2023-11-11 MED ORDER — NITROGLYCERIN 1 MG/10 ML FOR IR/CATH LAB
INTRA_ARTERIAL | Status: DC | PRN
  Administered 2023-11-11 (×2): 200 ug via INTRACORONARY

## 2023-11-11 MED ORDER — SODIUM CHLORIDE 0.9 % WEIGHT BASED INFUSION
3.0000 mL/kg/h | INTRAVENOUS | Status: AC
Start: 2023-11-11 — End: 2023-11-11

## 2023-11-11 MED ORDER — LIDOCAINE HCL (PF) 1 % IJ SOLN
INTRAMUSCULAR | Status: AC
  Filled 2023-11-11: qty 30

## 2023-11-11 MED ORDER — LABETALOL HCL 5 MG/ML IV SOLN: 10.0000 mg | INTRAVENOUS | Status: DC | PRN

## 2023-11-11 MED ORDER — HYDRALAZINE HCL 20 MG/ML IJ SOLN: 10.0000 mg | INTRAMUSCULAR | Status: DC | PRN

## 2023-11-11 MED ORDER — HYDRALAZINE HCL 20 MG/ML IJ SOLN
INTRAMUSCULAR | Status: DC | PRN
  Administered 2023-11-11: 10 mg via INTRAVENOUS

## 2023-11-11 MED ORDER — IOHEXOL 350 MG/ML SOLN
INTRAVENOUS | Status: DC | PRN
Start: 2023-11-11 — End: 2023-11-11
  Administered 2023-11-11: 220 mL

## 2023-11-11 MED ORDER — HYDRALAZINE HCL 20 MG/ML IJ SOLN
INTRAMUSCULAR | Status: AC
  Filled 2023-11-11: qty 1

## 2023-11-11 MED ORDER — ONDANSETRON HCL 4 MG/2ML IJ SOLN: 4.0000 mg | Freq: Four times a day (QID) | INTRAMUSCULAR | Status: DC | PRN

## 2023-11-11 MED ORDER — NITROGLYCERIN 1 MG/10 ML FOR IR/CATH LAB
INTRA_ARTERIAL | Status: AC
  Filled 2023-11-11: qty 10

## 2023-11-11 MED ORDER — HEPARIN (PORCINE) IN NACL 1000-0.9 UT/500ML-% IV SOLN
INTRAVENOUS | Status: DC | PRN
  Administered 2023-11-11 (×2): 500 mL

## 2023-11-11 MED ORDER — SODIUM CHLORIDE 0.9% FLUSH: 3.0000 mL | INTRAVENOUS | Status: DC | PRN

## 2023-11-11 MED ORDER — HEPARIN SODIUM (PORCINE) 1000 UNIT/ML IJ SOLN
INTRAMUSCULAR | Status: AC
  Filled 2023-11-11: qty 10

## 2023-11-11 MED ORDER — FENTANYL CITRATE (PF) 100 MCG/2ML IJ SOLN
INTRAMUSCULAR | Status: DC | PRN
  Administered 2023-11-11 (×3): 25 ug via INTRAVENOUS

## 2023-11-11 MED ORDER — CLOPIDOGREL BISULFATE 300 MG PO TABS
ORAL_TABLET | ORAL | Status: DC | PRN
  Administered 2023-11-11: 600 mg via ORAL

## 2023-11-11 MED ORDER — FUROSEMIDE 10 MG/ML IJ SOLN: 20.0000 mg | Freq: Once | INTRAMUSCULAR | Status: DC

## 2023-11-11 MED ORDER — MIDAZOLAM HCL 2 MG/2ML IJ SOLN
INTRAMUSCULAR | Status: DC | PRN
  Administered 2023-11-11 (×2): 1 mg via INTRAVENOUS

## 2023-11-11 MED ORDER — LIDOCAINE HCL (PF) 1 % IJ SOLN
INTRAMUSCULAR | Status: DC | PRN
  Administered 2023-11-11: 2 mL via INTRADERMAL

## 2023-11-11 MED ORDER — HEPARIN SODIUM (PORCINE) 1000 UNIT/ML IJ SOLN
INTRAMUSCULAR | Status: DC | PRN
  Administered 2023-11-11 (×2): 6000 [IU] via INTRAVENOUS

## 2023-11-11 MED ORDER — SODIUM CHLORIDE 0.9 % IV SOLN
INTRAVENOUS | Status: DC
Start: 2023-11-11 — End: 2023-11-11

## 2023-11-11 MED ORDER — CLOPIDOGREL BISULFATE 75 MG PO TABS: 75.0000 mg | ORAL_TABLET | Freq: Every day | ORAL | Status: DC

## 2023-11-11 MED ORDER — SODIUM CHLORIDE 0.9% FLUSH: 3.0000 mL | Freq: Two times a day (BID) | INTRAVENOUS | Status: DC

## 2023-11-11 MED ORDER — ACETAMINOPHEN 325 MG PO TABS: 650.0000 mg | ORAL_TABLET | ORAL | Status: DC | PRN

## 2023-11-11 MED ORDER — CLOPIDOGREL BISULFATE 75 MG PO TABS
75.0000 mg | ORAL_TABLET | Freq: Every day | ORAL | 3 refills | Status: DC
  Filled 2023-11-11: qty 30, 30d supply, fill #0

## 2023-11-11 MED ORDER — ROSUVASTATIN CALCIUM 10 MG PO TABS: 20.0000 mg | ORAL_TABLET | Freq: Every day | ORAL | Status: DC

## 2023-11-11 SURGICAL SUPPLY — 23 items
BALLN EMERGE MR 2.0X8 (BALLOONS) ×1 IMPLANT
BALLN ~~LOC~~ EMERGE MR 2.5X12 (BALLOONS) ×1 IMPLANT
BALLOON EMERGE MR 2.0X8 (BALLOONS) IMPLANT
BALLOON ~~LOC~~ EMERGE MR 2.5X12 (BALLOONS) IMPLANT
CARD KEY FFR CATHWORX (MISCELLANEOUS) IMPLANT
CATH INFINITI 5 FR 3DRC (CATHETERS) IMPLANT
CATH INFINITI 5 FR AR1 MOD (CATHETERS) IMPLANT
CATH INFINITI 5 FR AR2 MOD (CATHETERS) IMPLANT
CATH INFINITI AMBI 5FR TG (CATHETERS) IMPLANT
CATH INFINITI JR4 5F (CATHETERS) IMPLANT
CATH LAUNCHER 6FR EBU3.5 (CATHETERS) IMPLANT
DEVICE RAD COMP TR BAND LRG (VASCULAR PRODUCTS) IMPLANT
FFR CATHWORX KEY CARD (MISCELLANEOUS) ×1 IMPLANT
GLIDESHEATH SLEND A-KIT 6F 22G (SHEATH) IMPLANT
GUIDEWIRE INQWIRE 1.5J.035X260 (WIRE) IMPLANT
INQWIRE 1.5J .035X260CM (WIRE) ×1 IMPLANT
KIT ENCORE 26 ADVANTAGE (KITS) IMPLANT
KIT HEMO VALVE WATCHDOG (MISCELLANEOUS) IMPLANT
PACK CARDIAC CATHETERIZATION (CUSTOM PROCEDURE TRAY) ×1 IMPLANT
SET ATX-X65L (MISCELLANEOUS) IMPLANT
STENT SYNERGY XD 2.25X16 (Permanent Stent) IMPLANT
SYNERGY XD 2.25X16 (Permanent Stent) ×1 IMPLANT
WIRE ASAHI PROWATER 180CM (WIRE) IMPLANT

## 2023-11-11 NOTE — Progress Notes (Signed)
 Pt was educated on stent card, stent location, Antiplatelet and ASA use, wt restrictions, no baths/daily wash-ups, s/s of infection, ex guidelines, s/s to stop exercising, NTG use and calling 911, heart healthy diet, risk factors and CRPII. Pt received materials on exercise, diet, and CRPII. Will refer to [facility]   Faustino Congress MS, ACSM-CEP  11/11/2023 12:44 PM .

## 2023-11-11 NOTE — H&P (Signed)
 OV 3/20 copied for documentation   Cardiology Consultation:    Date:  11/11/2023   ID:  Kristen Vazquez, DOB [birth year], MRN [id number]  PCP:  Kristen Sofia, PA-C  Cardiologist:  Kristen Negus, MD   Referring MD: No ref. provider found   C/c: Chest pain    ASSESSMENT AND PLAN:   Kristen Vazquez 58 year old woman with history of with significant lesions in LAD possible with significant lesions in LAD territory, with progressive symptoms of dyspnea on exertion suggestive of angina, has history of COPD, hyperlipidemia, hypothyroidism.   Problem List Items Addressed This Visit       Cardiovascular and Mediastinum   CAD (coronary artery disease)   Relevant Medications   0.9% sodium chloride infusion   0.9% sodium chloride infusion (Start on 11/11/2023 10:00 AM)   Return to clinic tentatively in 2 to 3 weeks post cath.    History of Present Illness:    Kristen Vazquez is a 58 y.o. female who is being seen today for follow-up visit. PCP is No ref. provider found.  Last visit with [place] in the office was 09-20-2023 with Kristen Vazquez. Pleasant woman lives by herself.  Her husband passed away a few years ago.  She works at [employer] in [place] and her job involves lifting heavy objects.  She is history of coronary atherosclerosis noted on prior lung cancer screening CT chest and subsequently abnormal mild perfusion study, preferred conservative management, related with ongoing symptoms had cardiac CT study done and now with abnormal cardiac CT imaging as below here for follow-up visit. Also has history of COPD, hyperlipidemia, hypothyroidism.  Cardiac CT coronary angiogram from 10-12-2023 noted elevated calcium score 526, probable volume 792 mm cube suggest extensive atherosclerosis, CAD RADS3 study with moderate 50 to 69% lesion of RCA and LAD.  CT FFR abnormal for possible hemodynamically significant lesions in mid to distal LAD.  Mentions she does not have any chest pain but feels short  of breath with exertion and seems like Has not been progressing over the past 2 to 3 months.  No symptoms at rest.  Symptoms of shortness of breath with exertion relieved typically within few minutes after resting.  Mentions blood pressures at home have been relatively uncontrolled.  Denies any orthopnea.  Denies any paroxysmal nocturnal dyspnea. Denies any pedal edema. Denies any palpitations, lightheadedness, dizziness or syncopal episodes.  Denies smoking, alcohol or drug use.  EKG in the clinic today shows sinus rhythm heart rate 91/min, PR interval normal 1 452 ms, nonspecific ST-T changes notably in inferolateral leads.  Past Medical History:  Diagnosis Date   Abnormal blood chemistry 01/02/2020   Acquired hypothyroidism 12/14/2018   Acute laryngopharyngitis 01/02/2020   Acute pain of right shoulder due to trauma 10/23/2020   Acute sinusitis 08/19/2021   Anxiety 04/09/2020   Asthma    Controlled type 2 diabetes mellitus without complication, without long-term current use of insulin (HCC) 03/30/2023   COPD (chronic obstructive pulmonary disease) (HCC)    Depression    Dyspnea on exertion 03/30/2023   Encounter for screening mammogram for breast cancer 09/26/2019   Gastroesophageal reflux disease without esophagitis 12/14/2018   GERD (gastroesophageal reflux disease)    Kidney stones    Major depressive disorder, recurrent episode, moderate (HCC) 08/23/2018   Menorrhagia with irregular cycle 04/01/2021   Mild intermittent asthma 09/26/2019   Mixed hyperlipidemia 09/26/2019   Pain of right upper extremity 10/23/2020   Pelvic organ prolapse quantification stage 1 cystocele 04/01/2021  Status post hysteroscopy 05/21/2021    Past Surgical History:  Procedure Laterality Date   HERNIA REPAIR  2014    Current Medications: Current Meds  Medication Sig   Albuterol-Budesonide (AIRSUPRA) 90-80 MCG/ACT AERO Inhale 2 puffs into the lungs 4 (four) times daily as needed.    amLODipine (NORVASC) 2.5 MG tablet Take 1 tablet (2.5 mg total) by mouth daily.   aspirin EC 81 MG tablet Take 1 tablet (81 mg total) by mouth daily. Swallow whole.   Budeson-Glycopyrrol-Formoterol (BREZTRI AEROSPHERE) 160-9-4.8 MCG/ACT AERO INHALE 2 PUFFS IN THE MORNING AND AT BEDTIME   eletriptan (RELPAX) 20 MG tablet Take 1 tablet (20 mg total) by mouth as needed for migraine or headache. May repeat in 2 hours if headache persists or recurs.   escitalopram (LEXAPRO) 20 MG tablet Take 1 tablet by mouth once daily   fluticasone (FLONASE) 50 MCG/ACT nasal spray Place 2 sprays into both nostrils daily.   levothyroxine (SYNTHROID) 125 MCG tablet Take 1 tablet by mouth once daily   LORazepam (ATIVAN) 0.5 MG tablet Take 1 tablet (0.5 mg total) by mouth daily as needed for anxiety.   metoprolol succinate (TOPROL-XL) 25 MG 24 hr tablet Take 1 tablet (25 mg total) by mouth daily.   nitroGLYCERIN (NITROSTAT) 0.4 MG SL tablet Place 1 tablet (0.4 mg total) under the tongue every 5 (five) minutes as needed for chest pain.   omeprazole (PRILOSEC) 20 MG capsule Take 20 mg by mouth daily.   rosuvastatin (CRESTOR) 10 MG tablet Take 1 tablet by mouth once daily   Vitamin D, Ergocalciferol, (DRISDOL) 1.25 MG (50000 UNIT) CAPS capsule Take 1 capsule (50,000 Units total) by mouth every 7 (seven) days.     Allergies:   Codeine, Sulfa antibiotics, and Coconut (cocos nucifera)   Social History   Socioeconomic History   Marital status: Widowed    Spouse name: Not on file   Number of children: 1   Years of education: Not on file   Highest education level: Not on file  Occupational History   Occupation: [employer] consultant [employer]  Tobacco Use   Smoking status: Former    Current packs/day: 0.00    Average packs/day: 1 pack/day for 30.0 years (30.0 ttl pk-yrs)    Types: Cigarettes    Start date: 9    Quit date: 2012    Years since quitting: 13.2   Smokeless tobacco: Never  Vaping Use   Vaping status: Never  Used  Substance and Sexual Activity   Alcohol use: Yes    Alcohol/week: 2.0 standard drinks of alcohol    Types: 2 Glasses of wine per week    Comment: SOCIAL   Drug use: Never   Sexual activity: Not Currently  Other Topics Concern   Not on file  Social History Narrative   Not on file   Social Drivers of Health   Financial Resource Strain: Low Risk  (03/16/2023)   Overall Financial Resource Strain (CARDIA)    Difficulty of Paying Living Expenses: Not hard at all  Food Insecurity: No Food Insecurity (03/16/2023)   Hunger Vital Sign    Worried About Running Out of Food in the Last Year: Never true    Ran Out of Food in the Last Year: Never true  Transportation Needs: No Transportation Needs (03/16/2023)   PRAPARE - Administrator, Civil Service (Medical): No    Lack of Transportation (Non-Medical): No  Physical Activity: Inactive (03/16/2023)   Exercise Vital Sign  Days of Exercise per Week: 0 days    Minutes of Exercise per Session: 0 min  Stress: No Stress Concern Present (03/16/2023)   [employer] of Occupational Health - Occupational Stress Questionnaire    Feeling of Stress : Not at all  Social Connections: Moderately Isolated (03/16/2023)   Social Connection and Isolation Panel [NHANES]    Frequency of Communication with Friends and Family: More than three times a week    Frequency of Social Gatherings with Friends and Family: More than three times a week    Attends Religious Services: More than 4 times per year    Active Member of [employer] or Organizations: No    Attends Banker Meetings: Never    Marital Status: Widowed     Family History: The patient's family history includes Alcohol abuse in her brother; Breast cancer in her mother; Heart failure in her brother; Leukemia in her father. ROS:   Please see the history of present illness.    All 14 point review of systems negative except as described per history of present  illness.  EKGs/Labs/Other Studies Reviewed:    The following studies were reviewed today:   EKG:       Recent Labs: 09/16/2023: TSH 1.350 11/04/2023: ALT 19; BUN 19; Creatinine, Ser 0.72; Hemoglobin 12.9; Platelets 224; Potassium 4.3; Sodium 145  Recent Lipid Panel    Component Value Date/Time   CHOL 158 09/16/2023 1014   TRIG 145 09/16/2023 1014   HDL 55 09/16/2023 1014   CHOLHDL 2.9 09/16/2023 1014   LDLCALC 78 09/16/2023 1014    Physical Exam:    VS:  BP (!) 102/54   Pulse 70   Temp 97.9 F (36.6 C) (Oral)   Resp 20   Ht 5' 5.5" (1.664 m)   Wt 128.4 kg   SpO2 97%   BMI 46.38 kg/m     Wt Readings from Last 3 Encounters:  11/11/23 128.4 kg  11/04/23 128.6 kg  09/20/23 129.2 kg     GENERAL:  Well nourished, well developed in no acute distress NECK: No JVD; No carotid bruits CARDIAC: RRR, S1 and S2 present, no murmurs, no rubs, no gallops CHEST:  Clear to auscultation without rales, wheezing or rhonchi  Extremities: No pitting pedal edema. Pulses bilaterally symmetric with radial 2+ and dorsalis pedis 2+ NEUROLOGIC:  Alert and oriented x 3  Medication Adjustments/Labs and Tests Ordered: Current medicines are reviewed at length with the patient today.  Concerns regarding medicines are outlined above.  Orders Placed This Encounter  Procedures   Glucose, capillary   Apply Cardiac or Vascular Catheterization and/or Intervention Care Plan   Confirm CBC and BMP (or CMP) results within 7 days for inpatient and 30 days for outpatient: Outpatients with severe anemia (hgb<10, CKD, severe thrombocytopenia plts<100) labs should be within 10 days. Only draw PT/INR on patients that are on Coumadin, Hgb<10, have liver disease (cirrhosis, liver CA, hepatitis, etc). Urine pregnancy test within hospital admission for inpatients of child bearing age, for outpatients day of procedure.   Confirm EKG performed within 30 days for cardiac procedures and 12 months for peripheral vascular  procedures.  Place order for EKG if missing or not within timeframe.   Verify aspirin and / or anti-platelet medication (Plavix, Effient, Brilinta) dose available for cardiac / peripheral vascular procedure day. IF ordered daily / once, adjust schedule to administer before procedure.   Weigh patient   Initiate Cath/PCI clinical path; encourage patient to watch CCTV video  Informed Consent Details: Physician/Practitioner Attestation; Transcribe to consent form and obtain patient signature   Insert peripheral IV   Insert 2nd peripheral IV site-Saline lock IV   Meds ordered this encounter  Medications   FOLLOWED BY Linked Order Group    0.9% sodium chloride infusion    0.9% sodium chloride infusion    Signed, Vern Claude reddy Madireddy, MD, MPH, [facility] 11/11/2023 9:00 AM    [facility]

## 2023-11-11 NOTE — Discharge Summary (Signed)
 Discharge Summary for Same Day PCI   Patient ID: Kristen Vazquez MRN: [id number]; DOB: 12-12-1965  Admit date: 11/11/2023 Discharge date: 11/11/2023  Primary Care Provider: Marianne Sofia, PA-C  Primary Cardiologist: Marlyn Corporal Madireddy, MD  Primary Electrophysiologist:  None   Discharge Diagnoses    Active Problems:   CAD (coronary artery disease)   Diagnostic Studies/Procedures    Coronary angiography & intervention 11/11/2023: LM: Normal LAD: Tortuous vessel with mild to moderate calcification          Mid 40% stenosis, followed by 75% stenosis (Virtual FFR 0.76) Lcx: No significant disease RCA: Prox 60% stenosis with mild calcification (CTA ffr negative in 09/2023)   LVEDP 19 mmHg   Successful percutaneous coronary intervention mid LAD        PTCA and stent placement 2.25 X 16 mm Synergy drug-eluting stent        Post dilatation with 2.5X12 mm [place] balloon up to 18 atm        Virtual FFR 0.76 (prePCI)-->0.86 (post PCI)      Changed omeprazole to pantoprazole Increased Crestor from 10 mg to 20 mg daily (patient will use 10 mg pills for now and take 2 tabs a day, as she just refilled the bottle). Defer further dose adjustment to Dr. Vincent Gros.   Ok to discharge home today after 6 hours monitoring   Elder Negus, MD _____________   History of Present Illness     Kristen Vazquez is a 58 y.o. female with HTN, HLD, hypothyroidism and CAD noted on CCTA who was seen in the office on 3/20.    She has a history of coronary atherosclerosis noted on prior lung cancer screening CT chest and subsequently abnormal mild perfusion study, preferred conservative management, related with ongoing symptoms had cardiac CT study done and now with abnormal cardiac CT imaging as below here for follow-up visit. Also has history of COPD, hyperlipidemia, hypothyroidism.   Cardiac CT coronary angiogram from 10-12-2023 noted elevated calcium score 526, probable volume 792 mm cube suggest  extensive atherosclerosis, CAD RADS3 study with moderate 50 to 69% lesion of RCA and LAD.  CT FFR abnormal for possible hemodynamically significant lesions in mid to distal LAD.   Reports that she did not having any chest pain but felt short of breath with exertion and seems like this had not been progressing over the past 2 to 3 months.  No symptoms at rest.  Symptoms of shortness of breath with exertion relieved typically within few minutes after resting. Given findings she was set up for outpatient cardiac catheterization.   Hospital Course     The patient underwent cardiac cath as noted above with successful PCI/DES x1 to mLAD. Did have moderate disease in the pRCA of 60% which was negative with FFR 0.97 to be treated medically. Plan for DAPT with ASA/plavix for at least 6 months. The patient was seen by cardiac rehab while in short stay. There were no observed complications post cath. Radial cath site was re-evaluated prior to discharge and found to be stable without any complications. Instructions/precautions regarding cath site care were given prior to discharge.  [name] was seen by Dr. Rosemary Holms and determined stable for discharge home. Follow up with our office has been arranged. Medications are listed below. Pertinent changes include addition of plavix, switched to protonix and increased Crestor to 20mg  daily. _____________  Cath/PCI Registry Performance & Quality Measures: Aspirin prescribed? - Yes ADP Receptor Inhibitor (Plavix/Clopidogrel, Brilinta/Ticagrelor or Effient/Prasugrel)  prescribed (includes medically managed patients)? - Yes High Intensity Statin (Lipitor 40-80mg  or Crestor 20-40mg ) prescribed? - Yes For EF <40%, was ACEI/ARB prescribed? - Not Applicable (EF >/= 40%) For EF <40%, Aldosterone Antagonist (Spironolactone or Eplerenone) prescribed? - Yes Cardiac Rehab Phase II ordered (Included Medically managed Patients)? - Yes  _____________   Discharge  Vitals Blood pressure 123/70, pulse 85, temperature 97.9 F (36.6 C), temperature source Oral, resp. rate 20, height 5' 5.5" (1.664 m), weight 128.4 kg, SpO2 97%.  Filed Weights   11/11/23 0847  Weight: 128.4 kg    Last Labs & Radiologic Studies    CBC No results for input(s): "WBC", "NEUTROABS", "HGB", "HCT", "MCV", "PLT" in the last 72 hours. Basic Metabolic Panel No results for input(s): "NA", "K", "CL", "CO2", "GLUCOSE", "BUN", "CREATININE", "CALCIUM", "MG", "PHOS" in the last 72 hours. Liver Function Tests No results for input(s): "AST", "ALT", "ALKPHOS", "BILITOT", "PROT", "ALBUMIN" in the last 72 hours. No results for input(s): "LIPASE", "AMYLASE" in the last 72 hours. High Sensitivity Troponin:   No results for input(s): "TROPONINIHS" in the last 720 hours.  BNP Invalid input(s): "POCBNP" D-Dimer No results for input(s): "DDIMER" in the last 72 hours. Hemoglobin A1C No results for input(s): "HGBA1C" in the last 72 hours. Fasting Lipid Panel No results for input(s): "CHOL", "HDL", "LDLCALC", "TRIG", "CHOLHDL", "LDLDIRECT" in the last 72 hours. Thyroid Function Tests No results for input(s): "TSH", "T4TOTAL", "T3FREE", "THYROIDAB" in the last 72 hours.  Invalid input(s): "FREET3" _____________  CARDIAC CATHETERIZATION Result Date: 11/11/2023 Images from the original result were not included. Coronary angiography & intervention 11/11/2023: LM: Normal LAD: Tortuous vessel with mild to moderate calcification          Mid 40% stenosis, followed by 75% stenosis (Virtual FFR 0.76) Lcx: No significant disease RCA: Prox 60% stenosis with mild calcification (CTA ffr negative in 09/2023) LVEDP 19 mmHg Successful percutaneous coronary intervention mid LAD        PTCA and stent placement 2.25 X 16 mm Synergy drug-eluting stent        Post dilatation with 2.5X12 mm [place] balloon up to 18 atm        Virtual FFR 0.76 (prePCI)-->0.86 (post PCI) Changed omeprazole to pantoprazole Increased Crestor  from 10 mg to 20 mg daily (patient will use 10 mg pills for now and take 2 tabs a day, as she just refilled the bottle). Defer further dose adjustment to Dr. Vincent Gros. Ok to discharge home today after 6 hours monitoring Manish Emiliano Dyer, MD  CT CORONARY FRACTIONAL FLOW RESERVE FLUID ANALYSIS Result Date: 10/12/2023 EXAM: FFRCT ANALYSIS for abnormal coronary CT-A. FINDINGS: FFRct analysis was performed on the original cardiac CT angiogram dataset. Diagrammatic representation of the FFRct analysis is provided in a separate PDF document in PACS. This dictation was created using the PDF document and an interactive 3D model of the results. 3D model is not available in the EMR/PACS. Normal FFR range is >0.80. 1. Left Main: FFRct 0.99 2. LAD: FFRct 0.95 proximal, 0.82 mid, 0.75 distal 3. LCX: FFRct 0.99 proximal, 0.81 mid.  OM1 FFRct 0.97 4. RCA: FFRct 0.97 proximal, 0.87 mid, 0.85 distal IMPRESSION: 1. FFRct findings are consistent with abnormal perfusion in the mid to distal LAD. 2. Given her CAD, abnormal stress test, and chest pain, recommend cardiac catheterization. Tiffany C. Duke Salvia, MD Electronically Signed   By: Chilton Si M.D.   On: 10/12/2023 14:31    Disposition   Pt is being discharged home today in good  condition.  Follow-up Plans & Appointments     Discharge Instructions     Amb Referral to Cardiac Rehabilitation   Complete by: As directed    Diagnosis: Coronary Stents   After initial evaluation and assessments completed: Virtual Based Care may be provided alone or in conjunction with Phase 2 Cardiac Rehab based on patient barriers.: Yes   Intensive Cardiac Rehabilitation (ICR) [facility] location only OR Traditional Cardiac Rehabilitation (TCR) *If criteria for ICR are not met will enroll in TCR [facility] only): Yes        Discharge Medications   Allergies as of 11/11/2023       Reactions   Codeine    Chest pain/heaviness.   Sulfa Antibiotics Rash   Coconut (cocos Nucifera)  Rash        Medication List     STOP taking these medications    omeprazole 20 MG capsule Commonly known as: PRILOSEC       TAKE these medications    Airsupra 90-80 MCG/ACT Aero Generic drug: Albuterol-Budesonide Inhale 2 puffs into the lungs 4 (four) times daily as needed.   amLODipine 2.5 MG tablet Commonly known as: NORVASC Take 1 tablet (2.5 mg total) by mouth daily.   aspirin EC 81 MG tablet Take 1 tablet (81 mg total) by mouth daily. Swallow whole.   Breztri Aerosphere 160-9-4.8 MCG/ACT Aero Generic drug: budeson-glycopyrrolate-formoterol INHALE 2 PUFFS IN THE MORNING AND AT BEDTIME   clopidogrel 75 MG tablet Commonly known as: Plavix Take 1 tablet (75 mg total) by mouth daily.   eletriptan 20 MG tablet Commonly known as: Relpax Take 1 tablet (20 mg total) by mouth as needed for migraine or headache. May repeat in 2 hours if headache persists or recurs.   escitalopram 20 MG tablet Commonly known as: LEXAPRO Take 1 tablet by mouth once daily   fluticasone 50 MCG/ACT nasal spray Commonly known as: FLONASE Place 2 sprays into both nostrils daily.   levothyroxine 125 MCG tablet Commonly known as: SYNTHROID Take 1 tablet by mouth once daily   LORazepam 0.5 MG tablet Commonly known as: ATIVAN Take 1 tablet (0.5 mg total) by mouth daily as needed for anxiety.   metoprolol succinate 25 MG 24 hr tablet Commonly known as: TOPROL-XL Take 1 tablet (25 mg total) by mouth daily.   nitroGLYCERIN 0.4 MG SL tablet Commonly known as: NITROSTAT Place 1 tablet (0.4 mg total) under the tongue every 5 (five) minutes as needed for chest pain.   pantoprazole 20 MG tablet Commonly known as: PROTONIX Take 1 tablet (20 mg total) by mouth daily before breakfast.   rosuvastatin 10 MG tablet Commonly known as: CRESTOR Take 2 tablets (20 mg total) by mouth daily. What changed: how much to take   Vitamin D (Ergocalciferol) 1.25 MG (50000 UNIT) Caps capsule Commonly  known as: DRISDOL Take 1 capsule (50,000 Units total) by mouth every 7 (seven) days.         Allergies Allergies  Allergen Reactions   Codeine     Chest pain/heaviness.   Sulfa Antibiotics Rash   Coconut (Cocos Nucifera) Rash    Outstanding Labs/Studies   FLP/LFTs in 8 weeks   Duration of Discharge Encounter   Greater than 30 minutes including physician time.  Signed, Laverda Page, NP 11/11/2023, 12:58 PM

## 2023-11-11 NOTE — Interval H&P Note (Signed)
 History and Physical Interval Note:  11/11/2023 9:02 AM  Kristen Vazquez  has presented today for surgery, with the diagnosis of abnormal cardiac ct.  The various methods of treatment have been discussed with the patient and family. After consideration of risks, benefits and other options for treatment, the patient has consented to  Procedure(s): LEFT HEART CATH AND CORONARY ANGIOGRAPHY (N/A) as a surgical intervention.  The patient's history has been reviewed, patient examined, no change in status, stable for surgery.  I have reviewed the patient's chart and labs.  Questions were answered to the patient's satisfaction.     Tanya Marvin J Garrett Bowring

## 2023-11-11 NOTE — Discharge Instructions (Signed)
 Radial Site Care  This sheet gives you information about how to care for yourself after your procedure. Your health care provider may also give you more specific instructions. If you have problems or questions, contact your health care provider. What can I expect after the procedure? After the procedure, it is common to have: Bruising and tenderness at the catheter insertion area. Follow these instructions at home: Medicines Take over-the-counter and prescription medicines only as told by your health care provider. Insertion site care Follow instructions from your health care provider about how to take care of your insertion site. Make sure you: Wash your hands with soap and water before you remove your bandage (dressing). If soap and water are not available, use hand sanitizer. May remove dressing in 24 hours. Check your insertion site every day for signs of infection. Check for: Redness, swelling, or pain. Fluid or blood. Pus or a bad smell. Warmth. Do no take baths, swim, or use a hot tub for 5 days. You may shower 24-48 hours after the procedure. Remove the dressing and gently wash the site with plain soap and water. Pat the area dry with a clean towel. Do not rub the site. That could cause bleeding. Do not apply powder or lotion to the site. Activity  For 24 hours after the procedure, or as directed by your health care provider: Do not flex or bend the affected arm. Do not push or pull heavy objects with the affected arm. Do not drive yourself home from the hospital or clinic. You may drive 24 hours after the procedure. Do not operate machinery or power tools. KEEP ARM ELEVATED THE REMAINDER OF THE DAY. Do not push, pull or lift anything that is heavier than 10 lb for 5 days. Ask your health care provider when it is okay to: Return to work or school. Resume usual physical activities or sports. Resume sexual activity. General instructions If the catheter site starts to  bleed, raise your arm and put firm pressure on the site. If the bleeding does not stop, get help right away. This is a medical emergency. DRINK PLENTY OF FLUIDS FOR THE NEXT 2-3 DAYS. No alcohol consumption for 24 hours after receiving sedation. If you went home on the same day as your procedure, a responsible adult should be with you for the first 24 hours after you arrive home. Keep all follow-up visits as told by your health care provider. This is important. Contact a health care provider if: You have a fever. You have redness, swelling, or yellow drainage around your insertion site. Get help right away if: You have unusual pain at the radial site. The catheter insertion area swells very fast. The insertion area is bleeding, and the bleeding does not stop when you hold steady pressure on the area. Your arm or hand becomes pale, cool, tingly, or numb. These symptoms may represent a serious problem that is an emergency. Do not wait to see if the symptoms will go away. Get medical help right away. Call your local emergency services (911 in the [place] Do not drive yourself to the hospital. Summary After the procedure, it is common to have bruising and tenderness at the site. Follow instructions from your health care provider about how to take care of your radial site wound. Check the wound every day for signs of infection.  This information is not intended to replace advice given to you by your health care provider. Make sure you discuss any questions you have with  your health care provider. Document Revised: 09/08/2017 Document Reviewed: 09/08/2017 Elsevier Patient Education  2020 [employer].

## 2023-11-11 NOTE — Interval H&P Note (Signed)
 History and Physical Interval Note:  11/11/2023 9:50 AM  Kristen Vazquez  has presented today for surgery, with the diagnosis of abnormal cardiac ct.  The various methods of treatment have been discussed with the patient and family. After consideration of risks, benefits and other options for treatment, the patient has consented to  Procedure(s): LEFT HEART CATH AND CORONARY ANGIOGRAPHY (N/A) as a surgical intervention.  The patient's history has been reviewed, patient examined, no change in status, stable for surgery.  I have reviewed the patient's chart and labs.  Questions were answered to the patient's satisfaction.     [name]

## 2023-11-12 ENCOUNTER — Encounter (HOSPITAL_COMMUNITY): Payer: Self-pay | Admitting: Cardiology

## 2023-11-12 LAB — POCT ACTIVATED CLOTTING TIME
Activated Clotting Time: 325 s
Activated Clotting Time: 481 s

## 2023-11-12 MED FILL — Verapamil HCl IV Soln 2.5 MG/ML: INTRAVENOUS | Qty: 2 | Status: AC

## 2023-11-15 ENCOUNTER — Telehealth (HOSPITAL_COMMUNITY): Payer: Self-pay | Admitting: *Deleted

## 2023-11-15 NOTE — Telephone Encounter (Signed)
 Cardiac Rehab Phase II referral faxed to [facility] per pt request. Ethelda Chick BS, ACSM-CEP 11/15/2023 1:07 PM

## 2023-11-18 ENCOUNTER — Ambulatory Visit

## 2023-11-18 VITALS — BP 126/88 | HR 74 | Ht 65.0 in | Wt 280.0 lb

## 2023-11-18 DIAGNOSIS — E782 Mixed hyperlipidemia: Secondary | ICD-10-CM | POA: Diagnosis not present

## 2023-11-18 DIAGNOSIS — I2510 Atherosclerotic heart disease of native coronary artery without angina pectoris: Secondary | ICD-10-CM

## 2023-11-18 DIAGNOSIS — I10 Essential (primary) hypertension: Secondary | ICD-10-CM

## 2023-11-18 DIAGNOSIS — I25118 Atherosclerotic heart disease of native coronary artery with other forms of angina pectoris: Secondary | ICD-10-CM | POA: Diagnosis not present

## 2023-11-18 LAB — ECHOCARDIOGRAM COMPLETE
Height: 65 in
S' Lateral: 2.9 cm
Weight: 4480 [oz_av]

## 2023-11-18 NOTE — Assessment & Plan Note (Signed)
 Well-controlled. Continue current medications metoprolol XL 25 mg once daily and amlodipine 2.5 mg once daily. Target below 130/80 mmHg.

## 2023-11-18 NOTE — Assessment & Plan Note (Addendum)
 Doing well.  Asymptomatic. Continue dual antiplatelet therapy with aspirin 81 mg once daily indefinitely. Plavix 75 mg once daily for 12 months. Continue metoprolol XL 25 mg once daily.  No clinical signs or symptoms of heart failure. She has echocardiogram scheduled and pending for today.  Will review results once available.  Patient was on her activities as tolerated without any limitations.

## 2023-11-18 NOTE — Progress Notes (Signed)
 Cardiology Consultation:    Date:  11/18/2023   ID:  Kristen Vazquez, DOB 01-02-1966, MRN [id number]  PCP:  Kristen Sofia, PA-C  Cardiologist:  Marlyn Corporal Haileigh Pitz, MD   Referring MD: Kristen Sofia, PA-C   No chief complaint on file.    ASSESSMENT AND PLAN:   [name] 57/F  Problem List Items Addressed This Visit     Mixed hyperlipidemia   Continue rosuvastatin 20 mg once daily. Since last lipid panel from 09-06-2023 total cholesterol 158, HDL 55, LDL 78, triglycerides 145.      CAD (coronary artery disease);abnml stress MPI 04/2023; abnml CT cors 09/2023 mid LAD and RCA; s/p cath 11/11/2023 PCI of mid LAD - Primary   Doing well.  Asymptomatic. Continue dual antiplatelet therapy with aspirin 81 mg once daily indefinitely. Plavix 75 mg once daily for 12 months. Continue metoprolol XL 25 mg once daily.  No clinical signs or symptoms of heart failure. She has echocardiogram scheduled and pending for today.  Will review results once available.  Patient was on her activities as tolerated without any limitations.        Hypertension   Well-controlled. Continue current medications metoprolol XL 25 mg once daily and amlodipine 2.5 mg once daily. Target below 130/80 mmHg.         History of Present Illness:    Kristen Vazquez is a 58 y.o. female who is being seen today for follow-up visit after recent cardiac cath. PCP Kristen Sofia, PA-C. Last visit with me in the office was 11-04-2023.  Works for [employer] in [place].  History of coronary artery disease, COPD, hyperlipidemia, hypothyroidism.  With progressive symptoms of dyspnea on exertion over the past 2 to 3 months stress test done with nuclear imaging September 2024 was abnormal prompting cardiac CT imaging 10-12-2023 that noted CAD RADS 3 study with CT FFR abnormal for mid to distal LAD segments.  Now here for follow-up after cath done 11-11-2023 and underwent PCI of mid LAD.  Echocardiogram today pending.  Here for  the visit by herself.  Mentions doing well. Post cath had bruising over the right arm which have mostly resolved now.  She is walking going up a flight of stairs at times she gets out of breath but overall doing well. She has a visit coming up to her hometown in [place], plans to travel for a week and will return and then start her work back at [employer].  Has been compliant with the medications.  Tolerating them well.    Past Medical History:  Diagnosis Date   Abnormal blood chemistry 01/02/2020   Acquired hypothyroidism 12/14/2018   Acute laryngopharyngitis 01/02/2020   Acute pain of right shoulder due to trauma 10/23/2020   Acute sinusitis 08/19/2021   Anxiety 04/09/2020   Asthma    CAD (coronary artery disease);abnml stress MPI 04/2023; abnml CT cors 09/2023 mid LAD and RCA; s/p cath 11/11/2023 PCI of mid LAD 11/04/2023   Symptoms of dyspnea on exertion suggestive of angina and progressive over the past 2 to 3 months.  Prior abnormal stress test with nuclear imaging from September 2024  Abnormal cardiac CT imaging on 10-12-2023 with CAD RADS 3 study moderate 50 to 69% lesion of LAD and RCA with CT FFR abnormal for possible hemodynamically significant lesions in mid to distal LAD.  Calcium score 526 and total plaque    Controlled type 2 diabetes mellitus without complication, without long-term current use of insulin (HCC) 03/30/2023  COPD (chronic obstructive pulmonary disease) (HCC)    Depression    Dyspnea on exertion 03/30/2023   Encounter for screening mammogram for breast cancer 09/26/2019   Gastroesophageal reflux disease without esophagitis 12/14/2018   GERD (gastroesophageal reflux disease)    Hypertension 11/04/2023   Kidney stones    Major depressive disorder, recurrent episode, moderate (HCC) 08/23/2018   Menorrhagia with irregular cycle 04/01/2021   Mild intermittent asthma 09/26/2019   Mixed hyperlipidemia 09/26/2019   Pain of right upper extremity 10/23/2020    Pelvic organ prolapse quantification stage 1 cystocele 04/01/2021   Status post hysteroscopy 05/21/2021    Past Surgical History:  Procedure Laterality Date   CORONARY PRESSURE/FFR STUDY N/A 11/11/2023   Procedure: CORONARY PRESSURE/FFR STUDY;  Surgeon: Elder Negus, MD;  Location: [facility];  Service: Cardiovascular;  Laterality: N/A;  LAD   CORONARY STENT INTERVENTION N/A 11/11/2023   Procedure: CORONARY STENT INTERVENTION;  Surgeon: Elder Negus, MD;  Location: [facility];  Service: Cardiovascular;  Laterality: N/A;   HERNIA REPAIR  2014   LEFT HEART CATH AND CORONARY ANGIOGRAPHY N/A 11/11/2023   Procedure: LEFT HEART CATH AND CORONARY ANGIOGRAPHY;  Surgeon: Elder Negus, MD;  Location: [facility];  Service: Cardiovascular;  Laterality: N/A;    Current Medications: Current Meds  Medication Sig   Albuterol-Budesonide (AIRSUPRA) 90-80 MCG/ACT AERO Inhale 2 puffs into the lungs 4 (four) times daily as needed.   amLODipine (NORVASC) 2.5 MG tablet Take 1 tablet (2.5 mg total) by mouth daily.   aspirin EC 81 MG tablet Take 1 tablet (81 mg total) by mouth daily. Swallow whole.   Budeson-Glycopyrrol-Formoterol (BREZTRI AEROSPHERE) 160-9-4.8 MCG/ACT AERO INHALE 2 PUFFS IN THE MORNING AND AT BEDTIME   clopidogrel (PLAVIX) 75 MG tablet Take 1 tablet (75 mg total) by mouth daily.   eletriptan (RELPAX) 20 MG tablet Take 1 tablet (20 mg total) by mouth as needed for migraine or headache. May repeat in 2 hours if headache persists or recurs.   escitalopram (LEXAPRO) 20 MG tablet Take 1 tablet by mouth once daily   fluticasone (FLONASE) 50 MCG/ACT nasal spray Place 2 sprays into both nostrils daily.   levothyroxine (SYNTHROID) 125 MCG tablet Take 1 tablet by mouth once daily   LORazepam (ATIVAN) 0.5 MG tablet Take 1 tablet (0.5 mg total) by mouth daily as needed for anxiety.   metoprolol succinate (TOPROL-XL) 25 MG 24 hr tablet Take 1 tablet (25 mg total)  by mouth daily.   nitroGLYCERIN (NITROSTAT) 0.4 MG SL tablet Place 1 tablet (0.4 mg total) under the tongue every 5 (five) minutes as needed for chest pain.   pantoprazole (PROTONIX) 20 MG tablet Take 1 tablet (20 mg total) by mouth daily before breakfast.   rosuvastatin (CRESTOR) 10 MG tablet Take 2 tablets (20 mg total) by mouth daily.   Vitamin D, Ergocalciferol, (DRISDOL) 1.25 MG (50000 UNIT) CAPS capsule Take 1 capsule (50,000 Units total) by mouth every 7 (seven) days.     Allergies:   Codeine, Sulfa antibiotics, and Coconut (cocos nucifera)   Social History   Socioeconomic History   Marital status: Widowed    Spouse name: Not on file   Number of children: 1   Years of education: Not on file   Highest education level: Not on file  Occupational History   Occupation: [employer] [place]  Tobacco Use   Smoking status: Former    Current packs/day: 0.00    Average  packs/day: 1 pack/day for 30.0 years (30.0 ttl pk-yrs)    Types: Cigarettes    Start date: 17    Quit date: 2012    Years since quitting: 13.2   Smokeless tobacco: Never  Vaping Use   Vaping status: Never Used  Substance and Sexual Activity   Alcohol use: Yes    Alcohol/week: 2.0 standard drinks of alcohol    Types: 2 Glasses of wine per week    Comment: SOCIAL   Drug use: Never   Sexual activity: Not Currently  Other Topics Concern   Not on file  Social History Narrative   Not on file   Social Drivers of Health   Financial Resource Strain: Low Risk  (03/16/2023)   Overall Financial Resource Strain (CARDIA)    Difficulty of Paying Living Expenses: Not hard at all  Food Insecurity: No Food Insecurity (03/16/2023)   Hunger Vital Sign    Worried About Running Out of Food in the Last Year: Never true    Ran Out of Food in the Last Year: Never true  Transportation Needs: No Transportation Needs (03/16/2023)   PRAPARE - Administrator, Civil Service (Medical): No    Lack of Transportation  (Non-Medical): No  Physical Activity: Inactive (03/16/2023)   Exercise Vital Sign    Days of Exercise per Week: 0 days    Minutes of Exercise per Session: 0 min  Stress: No Stress Concern Present (03/16/2023)   [employer] of Occupational Health - Occupational Stress Questionnaire    Feeling of Stress : Not at all  Social Connections: Moderately Isolated (03/16/2023)   Social Connection and Isolation Panel [NHANES]    Frequency of Communication with Friends and Family: More than three times a week    Frequency of Social Gatherings with Friends and Family: More than three times a week    Attends Religious Services: More than 4 times per year    Active Member of [employer] or Organizations: No    Attends Banker Meetings: Never    Marital Status: Widowed     Family History: The patient's family history includes Alcohol abuse in her brother; Breast cancer in her mother; Heart failure in her brother; Leukemia in her father. ROS:   Please see the history of present illness.    All 14 point review of systems negative except as described per history of present illness.  EKGs/Labs/Other Studies Reviewed:    The following studies were reviewed today: Cardiac Studies & Procedures   ______________________________________________________________________________________________ CARDIAC CATHETERIZATION  CARDIAC CATHETERIZATION 11/11/2023  Narrative Images from the original result were not included. Coronary angiography & intervention 11/11/2023: LM: Normal LAD: Tortuous vessel with mild to moderate calcification Mid 40% stenosis, followed by 75% stenosis (Virtual FFR 0.76) Lcx: No significant disease RCA: Prox 60% stenosis with mild calcification (CTA ffr negative in 09/2023)  LVEDP 19 mmHg  Successful percutaneous coronary intervention mid LAD PTCA and stent placement 2.25 X 16 mm Synergy drug-eluting stent Post dilatation with 2.5X12 mm Allyn balloon up to 18 atm Virtual FFR  0.76 (prePCI)-->0.86 (post PCI)    Changed omeprazole to pantoprazole Increased Crestor from 10 mg to 20 mg daily (patient will use 10 mg pills for now and take 2 tabs a day, as she just refilled the bottle). Defer further dose adjustment to Dr. Vincent Gros.  Ok to discharge home today after 6 hours monitoring  Manish Emiliano Dyer, MD  Findings Coronary Findings Diagnostic  Dominance: Right  Left Main Vessel  is normal in caliber. Vessel is angiographically normal.  Left Anterior Descending The vessel is mildly calcified. Mid LAD-1 lesion is 40% stenosed. Mid LAD-2 lesion is 75% stenosed. Pressure gradient was performed on the lesion. Virtual FFR  pre PCI 0.76 Post PCI 0.86  Left Circumflex Vessel is normal in caliber. Vessel is angiographically normal.  Right Coronary Artery Vessel is normal in caliber. Prox RCA lesion is 60% stenosed. The lesion is moderately calcified.  Intervention  Mid LAD-2 lesion Stent Lesion length:  12 mm. CATH LAUNCHER 6FR EBU3.5 guide catheter was inserted. Lesion crossed with guidewire using a WIRE ASAHI PROWATER 180CM. Pre-stent angioplasty was performed using a BALLN EMERGE MR 2.0X8. Maximum pressure:  10 atm. Inflation time:  20 sec. A drug-eluting stent was successfully placed using a SYNERGY XD 2.25X16. Maximum pressure: 16 atm. Inflation time: 40 sec. Stent strut is well apposed. Post-stent angioplasty was performed using a BALLN [place] EMERGE MR 2.5X12. Maximum pressure:  16 atm. Inflation time:  20 sec. Post-Intervention Lesion Assessment The intervention was successful. Pre-interventional TIMI flow is 3. Post-intervention TIMI flow is 3. No complications occurred at this lesion. Pressure gradient was measured on the lesion. There is a 0% residual stenosis post intervention.   STRESS TESTS  MYOCARDIAL PERFUSION IMAGING 05/05/2023  Narrative   Findings are consistent with mild ischemia involving basal and mid portion of the antero - septal  wall. The study is low risk.   up sloping ST depression in the inferolateral leads (II, III, aVF, V5 and V6) was noted.   Left ventricular function is normal. Nuclear stress EF: 69%. The left ventricular ejection fraction is hyperdynamic (>65%). End diastolic cavity size is normal.        CT SCANS  CT CORONARY MORPH W/CTA COR W/SCORE 10/12/2023  Addendum 10/31/2023 12:00 AM ADDENDUM REPORT: 10/30/2023 23:58  EXAM: OVER-READ INTERPRETATION  CT CHEST  The following report is an over-read performed by radiologist Dr. Aram Candela of [facility] on 10/30/2023. This over-read does not include interpretation of cardiac or coronary anatomy or pathology. The coronary calcium score/coronary CTA interpretation by the cardiologist is attached.  COMPARISON:  November 17, 2022  FINDINGS: Cardiovascular: There are no significant extracardiac vascular findings.  Mediastinum/Nodes: There are no enlarged lymph nodes within the visualized mediastinum.  Lungs/Pleura: There is no pleural effusion. The visualized lungs appear clear.  Upper abdomen: There is a large hiatal hernia.  Musculoskeletal/Chest wall: No chest wall mass or suspicious osseous findings within the visualized chest.  IMPRESSION: Large hiatal hernia.   Electronically Signed By: Aram Candela M.D. On: 10/30/2023 23:58  Narrative CLINICAL DATA:  64F with COPD, coronary calcification, an abnormal stress test and chest pain.  EXAM: Cardiac/Coronary  CT  TECHNIQUE: The patient was scanned on a [employer].  FINDINGS: A 120 kV prospective scan was triggered in the descending thoracic aorta at 111 HU's. Axial non-contrast 3 mm slices were carried out through the heart. The data set was analyzed on a dedicated work station and scored using the Agatson method. Gantry rotation speed was 250 msecs and collimation was .6 mm. No beta blockade and 0.8 mg of sl NTG was given. The 3D data set was  reconstructed in 5% intervals of the 67-82 % of the R-R cycle. Diastolic phases were analyzed on a dedicated work station using MPR, MIP and VRT modes. The patient received 80 cc of contrast.  Aorta: Normal size.  No calcifications.  No dissection.  Aortic Valve:  Trileaflet.  No calcifications.  Coronary Arteries:  Normal coronary origin.  Right dominance.  RCA is a large dominant artery that gives rise to PDA and PLVB. There is moderate (50-69%) calcified plaque proximally and mild disease in the mid RCA.  Left main is a large artery that gives rise to LAD and LCX arteries.  LAD is a large vessel that has moderate (50-69%) mixed plaque in the proximal and mid LAD. D2 has mild (25-49%) plaque.  LCX is a non-dominant artery with minimal (<25%) mixed plaque. Image quality is poor.  Coronary Calcium Score:  Left main: 0  Left anterior descending artery: 408  Left circumflex artery: 10.7  Right coronary artery: 107  Total: 526  Percentile: 99th  Other findings:  Normal pulmonary vein drainage into the left atrium.  Normal let atrial appendage without a thrombus.  Normal size of the pulmonary artery.  Image quality is poor due to low signal to noise ratio and relatively high heart rates.  Non-cardiac: See separate report from [facility].  IMPRESSION: 1. Coronary calcium score of 526. This was 99th percentile for age-, sex, and race-matched controls.  2. Total plaque volume 792 mm3. (Calcified plaque 151 mm3; non-calcified plaque 641 mm3). TPV is extensive. Percentile for age- and sex-matched controls is not available at this time.  3. Normal coronary origin with right dominance.  4. There is moderate (50-69%) plaque in the RCA and LAD. CAD-RADS 3.  5.  Will send study for FFRct.  RECOMMENDATIONS: 1. CAD-RADS 0: No evidence of CAD (0%). Consider non-atherosclerotic causes of chest pain.  2. CAD-RADS 1: Minimal non-obstructive CAD (0-24%).  Consider non-atherosclerotic causes of chest pain. Consider preventive therapy and risk factor modification.  3. CAD-RADS 2: Mild non-obstructive CAD (25-49%). Consider non-atherosclerotic causes of chest pain. Consider preventive therapy and risk factor modification.  4. CAD-RADS 3: Moderate stenosis. Consider symptom-guided anti-ischemic pharmacotherapy as well as risk factor modification per guideline directed care. Additional analysis with CT FFR will be submitted.  5. CAD-RADS 4: Severe stenosis. (70-99% or > 50% left main). Cardiac catheterization or CT FFR is recommended. Consider symptom-guided anti-ischemic pharmacotherapy as well as risk factor modification per guideline directed care. Invasive coronary angiography recommended with revascularization per published guideline statements.  6. CAD-RADS 5: Total coronary occlusion (100%). Consider cardiac catheterization or viability assessment. Consider symptom-guided anti-ischemic pharmacotherapy as well as risk factor modification per guideline directed care.  7. CAD-RADS N: Non-diagnostic study. Obstructive CAD can't be excluded. Alternative evaluation is recommended.  Chilton Si, MD  Electronically Signed: By: Chilton Si M.D. On: 10/12/2023 14:23     ______________________________________________________________________________________________      EKG:       Recent Labs: 09/16/2023: TSH 1.350 11/04/2023: ALT 19; BUN 19; Creatinine, Ser 0.72; Hemoglobin 12.9; Platelets 224; Potassium 4.3; Sodium 145  Recent Lipid Panel    Component Value Date/Time   CHOL 158 09/16/2023 1014   TRIG 145 09/16/2023 1014   HDL 55 09/16/2023 1014   CHOLHDL 2.9 09/16/2023 1014   LDLCALC 78 09/16/2023 1014    Physical Exam:    VS:  BP 126/88   Pulse 74   Ht 5\' 5"  (1.651 m)   Wt 280 lb (127 kg)   LMP  (LMP Unknown)   SpO2 94%   BMI 46.59 kg/m     Wt Readings from Last 3 Encounters:  11/18/23 280 lb (127  kg)  11/11/23 283 lb (128.4 kg)  11/04/23 283 lb 9.6 oz (128.6 kg)     GENERAL:  Well nourished, well developed in no acute distress NECK: No JVD; No carotid bruits CARDIAC: RRR, S1 and S2 present, no murmurs, no rubs, no gallops CHEST:  Clear to auscultation without rales, wheezing or rhonchi  Extremities: No pitting pedal edema. Pulses bilaterally symmetric with radial 2+ and dorsalis pedis 2+.  Right radial forearm, cath access site normal. NEUROLOGIC:  Alert and oriented x 3  Medication Adjustments/Labs and Tests Ordered: Current medicines are reviewed at length with the patient today.  Concerns regarding medicines are outlined above.  No orders of the defined types were placed in this encounter.  No orders of the defined types were placed in this encounter.   Signed, Cecille Amsterdam, MD, MPH, [facility]. 11/18/2023 2:52 PM    [facility]

## 2023-11-18 NOTE — Assessment & Plan Note (Signed)
 Continue rosuvastatin 20 mg once daily. Since last lipid panel from 09-06-2023 total cholesterol 158, HDL 55, LDL 78, triglycerides 145.

## 2023-11-18 NOTE — Patient Instructions (Signed)

## 2023-11-19 ENCOUNTER — Telehealth: Payer: Self-pay

## 2023-11-19 NOTE — Telephone Encounter (Signed)
 I had Dr. Vincent Gros sign the forms and just faxed the paperwork to patients work. I called the patient to let her know and she asked that I hold on to her paperwork until she gets back from her trip next week. I agreed and she had no further questions at this time.

## 2023-11-19 NOTE — Telephone Encounter (Signed)
 Pt called in asking if nurse was able to fax over paperwork to her job. She states she was working with [place].

## 2023-11-29 ENCOUNTER — Other Ambulatory Visit: Payer: Self-pay | Admitting: Physician Assistant

## 2023-11-30 NOTE — Telephone Encounter (Signed)
 Called patient. She explained that more information was needed. I faxed what was needed and patient said she will pick up all the paperwork tomorrow (12/01/2023).

## 2023-11-30 NOTE — Telephone Encounter (Signed)
 Pt called in to check on faxed forms for her job. Please advise

## 2023-12-01 NOTE — Telephone Encounter (Signed)
 Patient picked up documents and had no other questions at this time.

## 2023-12-08 ENCOUNTER — Ambulatory Visit

## 2023-12-09 ENCOUNTER — Other Ambulatory Visit (HOSPITAL_COMMUNITY): Payer: Self-pay

## 2023-12-10 ENCOUNTER — Other Ambulatory Visit: Payer: Self-pay | Admitting: Physician Assistant

## 2023-12-10 DIAGNOSIS — J438 Other emphysema: Secondary | ICD-10-CM

## 2023-12-14 LAB — HM DIABETES EYE EXAM

## 2023-12-21 ENCOUNTER — Ambulatory Visit (INDEPENDENT_AMBULATORY_CARE_PROVIDER_SITE_OTHER): Payer: BC Managed Care – PPO | Admitting: Physician Assistant

## 2023-12-21 ENCOUNTER — Other Ambulatory Visit: Payer: Self-pay | Admitting: Physician Assistant

## 2023-12-21 ENCOUNTER — Encounter: Payer: Self-pay | Admitting: Physician Assistant

## 2023-12-21 VITALS — BP 136/88 | HR 76 | Temp 98.5°F | Resp 18 | Ht 65.0 in | Wt 280.2 lb

## 2023-12-21 DIAGNOSIS — E039 Hypothyroidism, unspecified: Secondary | ICD-10-CM

## 2023-12-21 DIAGNOSIS — K219 Gastro-esophageal reflux disease without esophagitis: Secondary | ICD-10-CM

## 2023-12-21 DIAGNOSIS — E119 Type 2 diabetes mellitus without complications: Secondary | ICD-10-CM

## 2023-12-21 DIAGNOSIS — J438 Other emphysema: Secondary | ICD-10-CM

## 2023-12-21 DIAGNOSIS — E559 Vitamin D deficiency, unspecified: Secondary | ICD-10-CM | POA: Diagnosis not present

## 2023-12-21 DIAGNOSIS — E782 Mixed hyperlipidemia: Secondary | ICD-10-CM | POA: Diagnosis not present

## 2023-12-21 DIAGNOSIS — F419 Anxiety disorder, unspecified: Secondary | ICD-10-CM

## 2023-12-21 DIAGNOSIS — I2510 Atherosclerotic heart disease of native coronary artery without angina pectoris: Secondary | ICD-10-CM

## 2023-12-21 MED ORDER — ROSUVASTATIN CALCIUM 20 MG PO TABS: 20.0000 mg | ORAL_TABLET | Freq: Every day | ORAL | 1 refills | Status: DC

## 2023-12-21 MED ORDER — PANTOPRAZOLE SODIUM 40 MG PO TBEC: 40.0000 mg | DELAYED_RELEASE_TABLET | Freq: Every day | ORAL | 1 refills | Status: DC

## 2023-12-21 NOTE — Progress Notes (Signed)
 Subjective:  Patient ID: Kristen Vazquez, female    DOB: 08/13/1966  Age: 58 y.o. MRN: [id number]  Chief Complaint  Patient presents with   Medical Management of Chronic Issues    Hyperlipidemia    Mixed hyperlipidemia  Pt presents with hyperlipidemia. Compliance with treatment has beengood The patient is compliant with medications, maintains a low cholesterol diet , follows up as directed , and maintains an exercise regimen . The patient denies experiencing any hypercholesterolemia related symptoms. Pt currently on crestor  20mg  qd Since last visit I did refer her back to cardiology after having an abnormal coronary CT and FFR - did end up getting a cardiac stent on 11/11/23 and states her symptoms of exertional dyspnea have improved dramatically and her energy level increased also She is now on plavix  75mg  as well as had crestor  increased to 20mg  as well as toprol  XL 25mg  qd and norvasc  2.5mg  qd She is also on ASA 81mg  qd - has an upcoming follow up appt with cardiology  Pt with  hypothyroidism - currently on synthroid  125mcg qd - due for labwork Voices no problems or concerns  Pt with  GERD - prilosec was stopped since she is taking Eliquis - she has not gotten filled because PA has not been done for her by cardiology office when they changed the medication.  Pt states she has tried omeprazole, famotdine, nexium and otc meds (tums)  Pt with anxiety - she is doing well on lexapro  20mg  and uses ativan  only as needed   Pt with diabetes - currently trying to control with diet - - she has not been started on medication at this time - has actually gained weight since last visit She is trying to make small changes in her diet  Pt with vit D def - taking weekly supplement and due for labwork  Pt with asthma/COPD - symptoms are stable on airsupra  and breztri   Pt has cologuard at home - has not completed Current Outpatient Medications on File Prior to Visit  Medication Sig Dispense Refill    Albuterol -Budesonide  (AIRSUPRA ) 90-80 MCG/ACT AERO Inhale 2 puffs into the lungs 4 (four) times daily as needed. 10.7 g 5   amLODipine  (NORVASC ) 2.5 MG tablet Take 1 tablet (2.5 mg total) by mouth daily. 180 tablet 3   aspirin  EC 81 MG tablet Take 1 tablet (81 mg total) by mouth daily. Swallow whole. 90 tablet 3   BREZTRI  AEROSPHERE 160-9-4.8 MCG/ACT AERO inhaler INHALE 2 PUFFS IN THE MORNING AND AT BEDTIME 11 g 0   clopidogrel  (PLAVIX ) 75 MG tablet Take 1 tablet (75 mg total) by mouth daily. 90 tablet 3   escitalopram  (LEXAPRO ) 20 MG tablet Take 1 tablet by mouth once daily 90 tablet 0   fluticasone  (FLONASE ) 50 MCG/ACT nasal spray Place 2 sprays into both nostrils daily. 16 g 6   levothyroxine  (SYNTHROID ) 125 MCG tablet Take 1 tablet by mouth once daily 90 tablet 0   LORazepam  (ATIVAN ) 0.5 MG tablet Take 1 tablet (0.5 mg total) by mouth daily as needed for anxiety. 30 tablet 0   metoprolol  succinate (TOPROL -XL) 25 MG 24 hr tablet Take 1 tablet (25 mg total) by mouth daily. 90 tablet 0   nitroGLYCERIN  (NITROSTAT ) 0.4 MG SL tablet Place 1 tablet (0.4 mg total) under the tongue every 5 (five) minutes as needed for chest pain. 25 tablet 1   Vitamin D , Ergocalciferol , (DRISDOL ) 1.25 MG (50000 UNIT) CAPS capsule Take 1 capsule (50,000 Units total) by  mouth every 7 (seven) days. 5 capsule 5   No current facility-administered medications on file prior to visit.   Past Medical History:  Diagnosis Date   Abnormal blood chemistry 01/02/2020   Acquired hypothyroidism 12/14/2018   Acute laryngopharyngitis 01/02/2020   Acute pain of right shoulder due to trauma 10/23/2020   Acute sinusitis 08/19/2021   Anxiety 04/09/2020   Asthma    CAD (coronary artery disease);abnml stress MPI 04/2023; abnml CT cors 09/2023 mid LAD and RCA; s/p cath 11/11/2023 PCI of mid LAD 11/04/2023   Symptoms of dyspnea on exertion suggestive of angina and progressive over the past 2 to 3 months.  Prior abnormal stress test with  nuclear imaging from September 2024  Abnormal cardiac CT imaging on 10-12-2023 with CAD RADS 3 study moderate 50 to 69% lesion of LAD and RCA with CT FFR abnormal for possible hemodynamically significant lesions in mid to distal LAD.  Calcium  score 526 and total plaque    Controlled type 2 diabetes mellitus without complication, without long-term current use of insulin (HCC) 03/30/2023   COPD (chronic obstructive pulmonary disease) (HCC)    Depression    Dyspnea on exertion 03/30/2023   Encounter for screening mammogram for breast cancer 09/26/2019   Gastroesophageal reflux disease without esophagitis 12/14/2018   GERD (gastroesophageal reflux disease)    Hypertension 11/04/2023   Kidney stones    Major depressive disorder, recurrent episode, moderate (HCC) 08/23/2018   Menorrhagia with irregular cycle 04/01/2021   Mild intermittent asthma 09/26/2019   Mixed hyperlipidemia 09/26/2019   Pain of right upper extremity 10/23/2020   Pelvic organ prolapse quantification stage 1 cystocele 04/01/2021   Status post hysteroscopy 05/21/2021   Past Surgical History:  Procedure Laterality Date   CORONARY PRESSURE/FFR STUDY N/A 11/11/2023   Procedure: CORONARY PRESSURE/FFR STUDY;  Surgeon: Cody Das, MD;  Location: [facility];  Service: Cardiovascular;  Laterality: N/A;  LAD   CORONARY STENT INTERVENTION N/A 11/11/2023   Procedure: CORONARY STENT INTERVENTION;  Surgeon: Cody Das, MD;  Location: [facility];  Service: Cardiovascular;  Laterality: N/A;   HERNIA REPAIR  2014   LEFT HEART CATH AND CORONARY ANGIOGRAPHY N/A 11/11/2023   Procedure: LEFT HEART CATH AND CORONARY ANGIOGRAPHY;  Surgeon: Cody Das, MD;  Location: [facility];  Service: Cardiovascular;  Laterality: N/A;    Family History  Problem Relation Age of Onset   Breast cancer Mother    Leukemia Father    Heart failure Brother    Alcohol abuse Brother    Social History    Socioeconomic History   Marital status: Widowed    Spouse name: Not on file   Number of children: 1   Years of education: Not on file   Highest education level: Not on file  Occupational History   Occupation: [employer] consultant [employer]  Tobacco Use   Smoking status: Former    Current packs/day: 0.00    Average packs/day: 1 pack/day for 30.0 years (30.0 ttl pk-yrs)    Types: Cigarettes    Start date: 25    Quit date: 2012    Years since quitting: 13.3   Smokeless tobacco: Never  Vaping Use   Vaping status: Never Used  Substance and Sexual Activity   Alcohol use: Yes    Alcohol/week: 2.0 standard drinks of alcohol    Types: 2 Glasses of wine per week    Comment: SOCIAL   Drug use: Never   Sexual activity:  Not Currently  Other Topics Concern   Not on file  Social History Narrative   Not on file   Social Drivers of Health   Financial Resource Strain: Low Risk  (03/16/2023)   Overall Financial Resource Strain (CARDIA)    Difficulty of Paying Living Expenses: Not hard at all  Food Insecurity: No Food Insecurity (03/16/2023)   Hunger Vital Sign    Worried About Running Out of Food in the Last Year: Never true    Ran Out of Food in the Last Year: Never true  Transportation Needs: No Transportation Needs (03/16/2023)   PRAPARE - Administrator, Civil Service (Medical): No    Lack of Transportation (Non-Medical): No  Physical Activity: Inactive (03/16/2023)   Exercise Vital Sign    Days of Exercise per Week: 0 days    Minutes of Exercise per Session: 0 min  Stress: No Stress Concern Present (03/16/2023)   [employer] of Occupational Health - Occupational Stress Questionnaire    Feeling of Stress : Not at all  Social Connections: Moderately Isolated (03/16/2023)   Social Connection and Isolation Panel [NHANES]    Frequency of Communication with Friends and Family: More than three times a week    Frequency of Social Gatherings with Friends and Family: More than  three times a week    Attends Religious Services: More than 4 times per year    Active Member of [employer] or Organizations: No    Attends Banker Meetings: Never    Marital Status: Widowed    CONSTITUTIONAL: Negative for chills, fatigue, fever, unintentional weight gain and unintentional weight loss.  E/N/T: Negative for ear pain, nasal congestion and sore throat.  CARDIOVASCULAR: Negative for chest pain, dizziness, palpitations and pedal edema.  RESPIRATORY: Negative for recent cough and dyspnea.  GASTROINTESTINAL: Negative for abdominal pain, acid reflux symptoms, constipation, diarrhea, nausea and vomiting.  MSK: Negative for arthralgias and myalgias.  INTEGUMENTARY: Negative for rash.  NEUROLOGICAL: Negative for dizziness and headaches.  PSYCHIATRIC: Negative for sleep disturbance and to question depression screen.  Negative for depression, negative for anhedonia.       Objective:  PHYSICAL EXAM:   VS: BP 136/88   Pulse 76   Temp 98.5 F (36.9 C) (Temporal)   Resp 18   Ht 5\' 5"  (1.651 m)   Wt 280 lb 3.2 oz (127.1 kg)   LMP  (LMP Unknown)   SpO2 97%   BMI 46.63 kg/m   GEN: Well nourished, well developed, in no acute distress   Cardiac: RRR; no murmurs, rubs, or gallops,no edema  Respiratory:  normal respiratory rate and pattern with no distress - normal breath sounds with no rales, rhonchi, wheezes or rubs  MS: no deformity or atrophy  Skin: warm and dry, no rash  Neuro:  Alert and Oriented x 3, - CN II-Xii grossly intact Psych: euthymic mood, appropriate affect and demeanor  Lab Results  Component Value Date   WBC 11.3 (H) 11/04/2023   HGB 12.9 11/04/2023   HCT 41.1 11/04/2023   PLT 224 11/04/2023   GLUCOSE 98 11/04/2023   CHOL 158 09/16/2023   TRIG 145 09/16/2023   HDL 55 09/16/2023   LDLCALC 78 09/16/2023   ALT 19 11/04/2023   AST 14 11/04/2023   NA 145 (H) 11/04/2023   K 4.3 11/04/2023   CL 104 11/04/2023   CREATININE 0.72 11/04/2023   BUN  19 11/04/2023   CO2 26 11/04/2023   TSH 1.350 09/16/2023  HGBA1C 6.9 (H) 09/16/2023      Assessment & Plan:   Problem List Items Addressed This Visit       Digestive   Gastroesophageal reflux disease without esophagitis Rx for protonix  40mg  qd     Endocrine   Acquired hypothyroidism - Primary Continue meds TSH pending     Other   Mixed hyperlipidemia   Relevant Orders   CBC with Differential/Platelet   Comprehensive metabolic panel   Lipid panel Watch diet Continue crestor  20mg  qd  Coronary artery disease (with stent placement) Continue meds as directed Follow up with cardiology as directed  Vit D def Continue supplement Labwork pending  COPD Continue breztri  Continue Airsupra     Anxiety Continue current meds    Type 2 diabetes mellitus with hyperglycemia, without long-term current use of insulin (HCC)       Relevant Orders   CBC with Differential/Platelet   Comprehensive metabolic panel   Lipid panel   Hemoglobin A1c Low sugar/low carb diet Efforts at weight loss  Colon cancer screening Complete cologuard      .  Meds ordered this encounter  Medications   rosuvastatin  (CRESTOR ) 20 MG tablet    Sig: Take 1 tablet (20 mg total) by mouth daily.    Dispense:  90 tablet    Refill:  1    Supervising Provider:   COX, KIRSTEN [[id number]]   pantoprazole  (PROTONIX ) 40 MG tablet    Sig: Take 1 tablet (40 mg total) by mouth daily.    Dispense:  90 tablet    Refill:  1    Supervising Provider:   Mercy Stall [id number]     Orders Placed This Encounter  Procedures   CBC with Differential/Platelet   Comprehensive metabolic panel with GFR   TSH   Lipid panel   Hemoglobin A1c   VITAMIN D  25 Hydroxy (Vit-D Deficiency, Fractures)   Microalbumin / creatinine urine ratio     Follow-up: Return in about 6 months (around 06/22/2024) for chronic fasting follow-up.  An After Visit Summary was printed and given to the patient.  Anthonette Bastos Cox  Family Practice [phone]

## 2023-12-22 LAB — COMPREHENSIVE METABOLIC PANEL WITH GFR
ALT: 22 IU/L (ref 0–32)
AST: 17 IU/L (ref 0–40)
Albumin: 4.4 g/dL (ref 3.8–4.9)
Alkaline Phosphatase: 131 IU/L — ABNORMAL HIGH (ref 44–121)
BUN/Creatinine Ratio: 19 (ref 9–23)
BUN: 11 mg/dL (ref 6–24)
Bilirubin Total: 0.2 mg/dL (ref 0.0–1.2)
CO2: 24 mmol/L (ref 20–29)
Calcium: 9.4 mg/dL (ref 8.7–10.2)
Chloride: 105 mmol/L (ref 96–106)
Creatinine, Ser: 0.57 mg/dL (ref 0.57–1.00)
Globulin, Total: 2.6 g/dL (ref 1.5–4.5)
Glucose: 110 mg/dL — ABNORMAL HIGH (ref 70–99)
Potassium: 4.3 mmol/L (ref 3.5–5.2)
Sodium: 142 mmol/L (ref 134–144)
Total Protein: 7 g/dL (ref 6.0–8.5)
eGFR: 107 mL/min/{1.73_m2} (ref >59)

## 2023-12-22 LAB — MICROALBUMIN / CREATININE URINE RATIO
Creatinine, Urine: 86.3 mg/dL
Microalb/Creat Ratio: 9 mg/g{creat} (ref 0–29)
Microalbumin, Urine: 7.4 ug/mL

## 2023-12-22 LAB — CBC WITH DIFFERENTIAL/PLATELET
Basophils Absolute: 0 10*3/uL (ref 0.0–0.2)
Basos: 1 %
EOS (ABSOLUTE): 0.3 10*3/uL (ref 0.0–0.4)
Eos: 4 %
Hematocrit: 46.2 % (ref 34.0–46.6)
Hemoglobin: 14.7 g/dL (ref 11.1–15.9)
Immature Grans (Abs): 0 10*3/uL (ref 0.0–0.1)
Immature Granulocytes: 0 %
Lymphocytes Absolute: 2.1 10*3/uL (ref 0.7–3.1)
Lymphs: 27 %
MCH: 27.5 pg (ref 26.6–33.0)
MCHC: 31.8 g/dL (ref 31.5–35.7)
MCV: 87 fL (ref 79–97)
Monocytes Absolute: 0.5 10*3/uL (ref 0.1–0.9)
Monocytes: 6 %
Neutrophils Absolute: 5.1 10*3/uL (ref 1.4–7.0)
Neutrophils: 62 %
Platelets: 228 10*3/uL (ref 150–450)
RBC: 5.34 x10E6/uL — ABNORMAL HIGH (ref 3.77–5.28)
RDW: 14.1 % (ref 11.7–15.4)
WBC: 8.1 10*3/uL (ref 3.4–10.8)

## 2023-12-22 LAB — HEMOGLOBIN A1C
Est. average glucose Bld gHb Est-mCnc: 151 mg/dL
Hgb A1c MFr Bld: 6.9 % — ABNORMAL HIGH (ref 4.8–5.6)

## 2023-12-22 LAB — LIPID PANEL
Chol/HDL Ratio: 2.9 ratio (ref 0.0–4.4)
Cholesterol, Total: 132 mg/dL (ref 100–199)
HDL: 46 mg/dL (ref >39)
LDL Chol Calc (NIH): 63 mg/dL (ref 0–99)
Triglycerides: 128 mg/dL (ref 0–149)
VLDL Cholesterol Cal: 23 mg/dL (ref 5–40)

## 2023-12-22 LAB — VITAMIN D 25 HYDROXY (VIT D DEFICIENCY, FRACTURES): Vit D, 25-Hydroxy: 31.2 ng/mL (ref 30.0–100.0)

## 2023-12-22 LAB — TSH: TSH: 0.468 u[IU]/mL (ref 0.450–4.500)

## 2023-12-24 ENCOUNTER — Encounter: Payer: Self-pay | Admitting: Family Medicine

## 2024-01-17 ENCOUNTER — Ambulatory Visit: Admitting: Family Medicine

## 2024-01-17 ENCOUNTER — Encounter: Payer: Self-pay | Admitting: Family Medicine

## 2024-01-17 ENCOUNTER — Ambulatory Visit (HOSPITAL_BASED_OUTPATIENT_CLINIC_OR_DEPARTMENT_OTHER)
Admission: RE | Admit: 2024-01-17 | Discharge: 2024-01-17 | Disposition: A | Discharge status: Home / Self Care | Source: Ambulatory Visit | Attending: Family Medicine | Admitting: Family Medicine

## 2024-01-17 ENCOUNTER — Ambulatory Visit (INDEPENDENT_AMBULATORY_CARE_PROVIDER_SITE_OTHER): Admitting: Family Medicine

## 2024-01-17 VITALS — BP 118/66 | HR 82 | Temp 97.8°F | Resp 16 | Ht 65.0 in | Wt 283.0 lb

## 2024-01-17 DIAGNOSIS — M5441 Lumbago with sciatica, right side: Secondary | ICD-10-CM

## 2024-01-17 HISTORY — DX: Lumbago with sciatica, right side: M54.41

## 2024-01-17 MED ORDER — LIDOCAINE 5 % EX PTCH: 1.0000 | MEDICATED_PATCH | CUTANEOUS | 0 refills | Status: DC

## 2024-01-17 NOTE — Assessment & Plan Note (Signed)
 Acute low back pain with burning sensation radiating to the right leg. No nephrolithiasis or urinary issues. Discussed lidocaine  patches for short-term relief. Referral to orthopedics considered. - Order lumbar spine x-ray. - Prescribe lidocaine  patches. - Refer to orthopedics for further evaluation and management options. - Provide back strengthening exercises. - Discuss potential ketorolac  injection for severe pain. - Advise follow-up for work accommodations paperwork.

## 2024-01-17 NOTE — Progress Notes (Signed)
 Acute Office Visit  Subjective:    Patient ID: Kristen Vazquez, female    DOB: 13-May-1966, 58 y.o.   MRN: [id number]  Chief Complaint  Patient presents with   Back Pain    X FOUR DAYS    Discussed the use of AI scribe software for clinical note transcription with the patient, who gave verbal consent to proceed.  History of Present Illness   Kristen Vazquez is a 58 year old female with diabetes who presents with lower back pain.  She has been experiencing lower back pain since stepping out of the shower on Friday. The pain is described as a shooting sensation that travels up her back, particularly when attempting to put on her underwear. She also reports a burning sensation in the lower lumbar region, describing it as 'the whole bottom of my back is so dead'.  She has been using ice and Tylenol  for pain management, which have provided some relief. She has not tried lidocaine  patches due to a history of skin reactions to similar topical creams. No imaging studies have been conducted yet, as the pain started over the weekend.  The pain has significantly impacted her daily activities, including getting out of bed and using the toilet. She has been unable to work since the pain began. She works at [employer] in [employer], which involves lifting, and she is concerned about her ability to return to work without restrictions.  She denies any recent falls or injuries related to the onset of the pain, although she did have a minor fall at work a week prior, which she does not believe is connected. No symptoms suggestive of a kidney stone, such as painful or frequent urination, and her urine has been normal in appearance.  She has a history of diabetes and had a stent placed in March, which has improved her breathing. She is cautious about taking strong pain medications and prefers to manage her pain with Tylenol .       Past Medical History:  Diagnosis Date   Abnormal blood  chemistry 01/02/2020   Acquired hypothyroidism 12/14/2018   Acute laryngopharyngitis 01/02/2020   Acute pain of right shoulder due to trauma 10/23/2020   Acute sinusitis 08/19/2021   Anxiety 04/09/2020   Asthma    CAD (coronary artery disease);abnml stress MPI 04/2023; abnml CT cors 09/2023 mid LAD and RCA; s/p cath 11/11/2023 PCI of mid LAD 11/04/2023   Symptoms of dyspnea on exertion suggestive of angina and progressive over the past 2 to 3 months.  Prior abnormal stress test with nuclear imaging from September 2024  Abnormal cardiac CT imaging on 10-12-2023 with CAD RADS 3 study moderate 50 to 69% lesion of LAD and RCA with CT FFR abnormal for possible hemodynamically significant lesions in mid to distal LAD.  Calcium  score 526 and total plaque    Controlled type 2 diabetes mellitus without complication, without long-term current use of insulin (HCC) 03/30/2023   COPD (chronic obstructive pulmonary disease) (HCC)    Depression    Dyspnea on exertion 03/30/2023   Encounter for screening mammogram for breast cancer 09/26/2019   Gastroesophageal reflux disease without esophagitis 12/14/2018   GERD (gastroesophageal reflux disease)    Hypertension 11/04/2023   Kidney stones    Major depressive disorder, recurrent episode, moderate (HCC) 08/23/2018   Menorrhagia with irregular cycle 04/01/2021   Mild intermittent asthma 09/26/2019   Mixed hyperlipidemia 09/26/2019   Pain of right upper extremity 10/23/2020   Pelvic organ  prolapse quantification stage 1 cystocele 04/01/2021   Status post hysteroscopy 05/21/2021    Past Surgical History:  Procedure Laterality Date   CORONARY PRESSURE/FFR STUDY N/A 11/11/2023   Procedure: CORONARY PRESSURE/FFR STUDY;  Surgeon: Cody Das, MD;  Location: [facility];  Service: Cardiovascular;  Laterality: N/A;  LAD   CORONARY STENT INTERVENTION N/A 11/11/2023   Procedure: CORONARY STENT INTERVENTION;  Surgeon: Cody Das, MD;   Location: [facility];  Service: Cardiovascular;  Laterality: N/A;   HERNIA REPAIR  2014   LEFT HEART CATH AND CORONARY ANGIOGRAPHY N/A 11/11/2023   Procedure: LEFT HEART CATH AND CORONARY ANGIOGRAPHY;  Surgeon: Cody Das, MD;  Location: [facility];  Service: Cardiovascular;  Laterality: N/A;    Family History  Problem Relation Age of Onset   Breast cancer Mother    Leukemia Father    Heart failure Brother    Alcohol abuse Brother     Social History   Socioeconomic History   Marital status: Widowed    Spouse name: Not on file   Number of children: 1   Years of education: Not on file   Highest education level: Not on file  Occupational History   Occupation: [employer]  Tobacco Use   Smoking status: Former    Current packs/day: 0.00    Average packs/day: 1 pack/day for 30.0 years (30.0 ttl pk-yrs)    Types: Cigarettes    Start date: 67    Quit date: 2012    Years since quitting: 13.4   Smokeless tobacco: Never  Vaping Use   Vaping status: Never Used  Substance and Sexual Activity   Alcohol use: Yes    Alcohol/week: 2.0 standard drinks of alcohol    Types: 2 Glasses of wine per week    Comment: SOCIAL   Drug use: Never   Sexual activity: Not Currently  Other Topics Concern   Not on file  Social History Narrative   Not on file   Social Drivers of Health   Financial Resource Strain: Low Risk  (03/16/2023)   Overall Financial Resource Strain (CARDIA)    Difficulty of Paying Living Expenses: Not hard at all  Food Insecurity: No Food Insecurity (03/16/2023)   Hunger Vital Sign    Worried About Running Out of Food in the Last Year: Never true    Ran Out of Food in the Last Year: Never true  Transportation Needs: No Transportation Needs (03/16/2023)   PRAPARE - Administrator, Civil Service (Medical): No    Lack of Transportation (Non-Medical): No  Physical Activity: Inactive (03/16/2023)   Exercise Vital Sign    Days of  Exercise per Week: 0 days    Minutes of Exercise per Session: 0 min  Stress: No Stress Concern Present (03/16/2023)   [employer] of Occupational Health - Occupational Stress Questionnaire    Feeling of Stress : Not at all  Social Connections: Moderately Isolated (03/16/2023)   Social Connection and Isolation Panel [NHANES]    Frequency of Communication with Friends and Family: More than three times a week    Frequency of Social Gatherings with Friends and Family: More than three times a week    Attends Religious Services: More than 4 times per year    Active Member of [employer] or Organizations: No    Attends Banker Meetings: Never    Marital Status: Widowed  Intimate Partner Violence: Not At Risk (03/16/2023)  Humiliation, Afraid, Rape, and Kick questionnaire    Fear of Current or Ex-Partner: No    Emotionally Abused: No    Physically Abused: No    Sexually Abused: No    Outpatient Medications Prior to Visit  Medication Sig Dispense Refill   Albuterol -Budesonide  (AIRSUPRA ) 90-80 MCG/ACT AERO Inhale 2 puffs into the lungs 4 (four) times daily as needed. 10.7 g 5   amLODipine  (NORVASC ) 2.5 MG tablet Take 1 tablet (2.5 mg total) by mouth daily. 180 tablet 3   aspirin  EC 81 MG tablet Take 1 tablet (81 mg total) by mouth daily. Swallow whole. 90 tablet 3   BREZTRI  AEROSPHERE 160-9-4.8 MCG/ACT AERO inhaler INHALE 2 PUFFS IN THE MORNING AND AT BEDTIME 11 g 0   clopidogrel  (PLAVIX ) 75 MG tablet Take 1 tablet (75 mg total) by mouth daily. 90 tablet 3   escitalopram  (LEXAPRO ) 20 MG tablet Take 1 tablet by mouth once daily 90 tablet 0   fluticasone  (FLONASE ) 50 MCG/ACT nasal spray Place 2 sprays into both nostrils daily. 16 g 6   levothyroxine  (SYNTHROID ) 125 MCG tablet Take 1 tablet by mouth once daily 90 tablet 0   LORazepam  (ATIVAN ) 0.5 MG tablet Take 1 tablet (0.5 mg total) by mouth daily as needed for anxiety. 30 tablet 0   metoprolol  succinate (TOPROL -XL) 25 MG 24 hr tablet  Take 1 tablet (25 mg total) by mouth daily. 90 tablet 0   nitroGLYCERIN  (NITROSTAT ) 0.4 MG SL tablet Place 1 tablet (0.4 mg total) under the tongue every 5 (five) minutes as needed for chest pain. 25 tablet 1   pantoprazole  (PROTONIX ) 40 MG tablet TAKE 1 TABLET BY MOUTH ONCE DAILY 90 tablet 1   rosuvastatin  (CRESTOR ) 20 MG tablet Take 1 tablet (20 mg total) by mouth daily. 90 tablet 1   Vitamin D , Ergocalciferol , (DRISDOL ) 1.25 MG (50000 UNIT) CAPS capsule Take 1 capsule (50,000 Units total) by mouth every 7 (seven) days. 5 capsule 5   No facility-administered medications prior to visit.    Allergies  Allergen Reactions   Codeine     Chest pain/heaviness.   Sulfa Antibiotics Rash   Coconut (Cocos Nucifera) Rash    Review of Systems  Constitutional:  Negative for chills, diaphoresis, fatigue and fever.  HENT:  Negative for congestion, ear pain and sinus pain.   Eyes: Negative.   Respiratory:  Negative for cough and shortness of breath.   Cardiovascular:  Negative for chest pain.  Gastrointestinal:  Negative for abdominal pain, constipation, nausea and vomiting.  Endocrine: Negative.   Genitourinary:  Negative for dysuria.  Musculoskeletal:  Positive for back pain (low - radiating to rt leg). Negative for arthralgias.  Skin: Negative.   Allergic/Immunologic: Negative.   Neurological:  Negative for dizziness, weakness, light-headedness and headaches.  Hematological: Negative.   Psychiatric/Behavioral:  Negative for dysphoric mood. The patient is not nervous/anxious.        Objective:         01/17/2024    2:49 PM 12/21/2023   11:03 AM 11/18/2023    2:15 PM  Vitals with BMI  Height 5\' 5"  5\' 5"  5\' 5"   Weight 283 lbs 280 lbs 3 oz 280 lbs  BMI 47.09 46.63 46.59  Systolic 118 136 161  Diastolic 66 88 88  Pulse 82 76 74    No data found.   Physical Exam Constitutional:      General: She is not in acute distress.    Appearance: Normal appearance. She is obese.  She is not  ill-appearing.  Eyes:     Conjunctiva/sclera: Conjunctivae normal.  Cardiovascular:     Rate and Rhythm: Normal rate and regular rhythm.     Heart sounds: Normal heart sounds. No murmur heard. Pulmonary:     Effort: Pulmonary effort is normal.     Breath sounds: Normal breath sounds. No wheezing.  Musculoskeletal:     Lumbar back: Decreased range of motion.  Neurological:     Mental Status: She is alert. Mental status is at baseline.  Psychiatric:        Mood and Affect: Mood normal.        Behavior: Behavior normal.     There are no preventive care reminders to display for this patient.  There are no preventive care reminders to display for this patient.   Lab Results  Component Value Date   TSH 0.468 12/21/2023   Lab Results  Component Value Date   WBC 8.1 12/21/2023   HGB 14.7 12/21/2023   HCT 46.2 12/21/2023   MCV 87 12/21/2023   PLT 228 12/21/2023   Lab Results  Component Value Date   NA 142 12/21/2023   K 4.3 12/21/2023   CO2 24 12/21/2023   GLUCOSE 110 (H) 12/21/2023   BUN 11 12/21/2023   CREATININE 0.57 12/21/2023   BILITOT 0.2 12/21/2023   ALKPHOS 131 (H) 12/21/2023   AST 17 12/21/2023   ALT 22 12/21/2023   PROT 7.0 12/21/2023   ALBUMIN 4.4 12/21/2023   CALCIUM  9.4 12/21/2023   EGFR 107 12/21/2023   Lab Results  Component Value Date   CHOL 132 12/21/2023   Lab Results  Component Value Date   HDL 46 12/21/2023   Lab Results  Component Value Date   LDLCALC 63 12/21/2023   Lab Results  Component Value Date   TRIG 128 12/21/2023   Lab Results  Component Value Date   CHOLHDL 2.9 12/21/2023   Lab Results  Component Value Date   HGBA1C 6.9 (H) 12/21/2023       Assessment & Plan:  Acute bilateral low back pain with right-sided sciatica Assessment & Plan: Acute low back pain with burning sensation radiating to the right leg. No nephrolithiasis or urinary issues. Discussed lidocaine  patches for short-term relief. Referral to orthopedics  considered. - Order lumbar spine x-ray. - Prescribe lidocaine  patches. - Refer to orthopedics for further evaluation and management options. - Provide back strengthening exercises. - Discuss potential ketorolac  injection for severe pain. - Advise follow-up for work accommodations paperwork.  Orders: -     DG Lumbar Spine Complete; Future -     Lidocaine ; Place 1 patch onto the skin daily. Remove & Discard patch within 12 hours or as directed by MD  Dispense: 30 patch; Refill: 0 -     Ambulatory referral to Orthopedics     Meds ordered this encounter  Medications   lidocaine  (LIDODERM ) 5 %    Sig: Place 1 patch onto the skin daily. Remove & Discard patch within 12 hours or as directed by MD    Dispense:  30 patch    Refill:  0    Orders Placed This Encounter  Procedures   DG Lumbar Spine Complete   AMB referral to orthopedics    Follow-up: Return in about 1 week (around 01/24/2024).  An After Visit Summary was printed and given to the patient.  Delford Felling, FNP [facility] [phone]

## 2024-01-20 ENCOUNTER — Other Ambulatory Visit: Payer: Self-pay | Admitting: Family Medicine

## 2024-01-20 DIAGNOSIS — F331 Major depressive disorder, recurrent, moderate: Secondary | ICD-10-CM

## 2024-01-21 ENCOUNTER — Telehealth: Payer: Self-pay

## 2024-01-21 NOTE — Telephone Encounter (Signed)
 Sent [employer].  Copied from CRM [id number]. Topic: Clinical - Lab/Test Results >> Jan 21, 2024  9:19 AM Lizabeth Riggs wrote: Reason for CRM:  Please call Shaketta back at [phone] with the results from her DG Lumbar Spine. She wants to know when to make another appointment. Thanks

## 2024-01-25 ENCOUNTER — Ambulatory Visit: Payer: Self-pay | Admitting: Family Medicine

## 2024-01-26 ENCOUNTER — Ambulatory Visit: Admitting: Family Medicine

## 2024-01-26 ENCOUNTER — Other Ambulatory Visit: Payer: Self-pay | Admitting: Physician Assistant

## 2024-01-26 VITALS — BP 136/70 | HR 82 | Temp 98.3°F | Resp 16 | Ht 65.0 in | Wt 285.6 lb

## 2024-01-26 DIAGNOSIS — E039 Hypothyroidism, unspecified: Secondary | ICD-10-CM

## 2024-01-26 DIAGNOSIS — R9439 Abnormal result of other cardiovascular function study: Secondary | ICD-10-CM

## 2024-01-26 DIAGNOSIS — M4306 Spondylolysis, lumbar region: Secondary | ICD-10-CM

## 2024-01-26 DIAGNOSIS — M5441 Lumbago with sciatica, right side: Secondary | ICD-10-CM | POA: Diagnosis not present

## 2024-01-26 HISTORY — DX: Spondylolysis, lumbar region: M43.06

## 2024-01-26 MED ORDER — LIDOCAINE 5 % EX PTCH: 1.0000 | MEDICATED_PATCH | CUTANEOUS | 0 refills | Status: DC

## 2024-01-26 MED ORDER — IBUPROFEN 800 MG PO TABS: 800.0000 mg | ORAL_TABLET | Freq: Three times a day (TID) | ORAL | 0 refills | Status: DC | PRN

## 2024-01-26 NOTE — Progress Notes (Signed)
 Subjective:  Patient ID: Kristen Vazquez, female    DOB: 01/05/1966  Age: 58 y.o. MRN: [id number]  Chief Complaint  Patient presents with   Follow-up    LOWER BACK PAIN    Discussed the use of AI scribe software for clinical note transcription with the patient, who gave verbal consent to proceed.  History of Present Illness   Kristen Vazquez is a 58 year old female with lumbar spondylosis who presents with persistent back pain.  Her back pain began on Jan 14, 2024, following an incident on a Friday morning. The pain is described as 'twingy moments' that worsen with movement, particularly after a shower or increased activity. It is most severe upon waking and after being active during the day, and it also bothers her at night.  She has been using lidocaine  patches, which have provided some relief, allowing her to sit comfortably and move around more in her kitchen. She sometimes experiences stomach issues with ibuprofen and is considering using lidocaine  patches twice a day for better relief.  Sleeping in her bed aggravates the pain, whereas sleeping in a recliner with a pillow behind her back is less painful. She has been using one lidocaine  patch daily.  Recent xray showed: FINDINGS: 4 mm of grade 1 anterolisthesis of L4 is new since 2023. Mild disc space height loss at L4-L5. Moderate facet arthropathy at L3-L4, L4-L5, and L5-S1. 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 No anterolisthesis of L5.   IMPRESSION: 1. 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 No L5 spondylolisthesis. 2. Grade 1 anterolisthesis of L4 is new since 2023.      12/21/2023   11:06 AM 09/16/2023    9:18 AM 03/16/2023   10:54 AM 11/10/2022    9:02 AM 07/07/2022    8:58 AM  Depression screen PHQ 2/9  Decreased Interest 0 0 0 0 0  Down, Depressed, Hopeless 0 0 0 0 0  PHQ - 2 Score 0 0 0 0 0  Altered sleeping 0 0 1 0 0  Tired, decreased energy 0 0 1 0 0   Change in appetite 0 0 0 0 0  Feeling bad or failure about yourself  0 0 0 0 0  Trouble concentrating 0 0 0 0 0  Moving slowly or fidgety/restless 0 0 0 0 0  Suicidal thoughts 0 0 0 0 0  PHQ-9 Score 0 0 2 0 0  Difficult doing work/chores Not difficult at all Not difficult at all Not difficult at all Not difficult at all Not difficult at all        12/21/2023   11:06 AM  Fall Risk   Falls in the past year? 0  Number falls in past yr: 0  Injury with Fall? 0  Risk for fall due to : No Fall Risks  Follow up Falls evaluation completed    Patient Care Team: Cyndi Drain, PA-C as PCP - General (Physician Assistant) Madireddy, Daymon Evans, MD as PCP - Cardiology (Cardiology)   Review of Systems  Constitutional:  Negative for chills, diaphoresis, fatigue and fever.  HENT:  Negative for congestion, ear pain and sinus pain.   Eyes: Negative.   Respiratory:  Negative for cough and shortness of breath.   Cardiovascular:  Negative for chest pain.  Gastrointestinal:  Negative for abdominal pain, constipation, nausea and vomiting.  Endocrine: Negative.   Genitourinary:  Negative for dysuria.  Musculoskeletal:  Positive for back pain. Negative for arthralgias.  Skin: Negative.   Allergic/Immunologic: Negative.   Neurological:  Negative for weakness and headaches.  Hematological: Negative.   Psychiatric/Behavioral:  Negative for dysphoric mood. The patient is not nervous/anxious.     Current Outpatient Medications on File Prior to Visit  Medication Sig Dispense Refill   Albuterol -Budesonide  (AIRSUPRA ) 90-80 MCG/ACT AERO Inhale 2 puffs into the lungs 4 (four) times daily as needed. 10.7 g 5   amLODipine  (NORVASC ) 2.5 MG tablet Take 1 tablet (2.5 mg total) by mouth daily. 180 tablet 3   aspirin  EC 81 MG tablet Take 1 tablet (81 mg total) by mouth daily. Swallow whole. 90 tablet 3   BREZTRI  AEROSPHERE 160-9-4.8 MCG/ACT AERO inhaler INHALE 2 PUFFS IN THE MORNING AND AT BEDTIME 11 g 0    clopidogrel  (PLAVIX ) 75 MG tablet Take 1 tablet (75 mg total) by mouth daily. 90 tablet 3   escitalopram  (LEXAPRO ) 20 MG tablet Take 1 tablet by mouth once daily 90 tablet 0   fluticasone  (FLONASE ) 50 MCG/ACT nasal spray Place 2 sprays into both nostrils daily. 16 g 6   LORazepam  (ATIVAN ) 0.5 MG tablet Take 1 tablet (0.5 mg total) by mouth daily as needed for anxiety. 30 tablet 0   nitroGLYCERIN  (NITROSTAT ) 0.4 MG SL tablet Place 1 tablet (0.4 mg total) under the tongue every 5 (five) minutes as needed for chest pain. 25 tablet 1   pantoprazole  (PROTONIX ) 40 MG tablet TAKE 1 TABLET BY MOUTH ONCE DAILY 90 tablet 1   rosuvastatin  (CRESTOR ) 20 MG tablet Take 1 tablet (20 mg total) by mouth daily. 90 tablet 1   Vitamin D , Ergocalciferol , (DRISDOL ) 1.25 MG (50000 UNIT) CAPS capsule Take 1 capsule (50,000 Units total) by mouth every 7 (seven) days. 5 capsule 5   No current facility-administered medications on file prior to visit.   Past Medical History:  Diagnosis Date   Abnormal blood chemistry 01/02/2020   Acquired hypothyroidism 12/14/2018   Acute laryngopharyngitis 01/02/2020   Acute pain of right shoulder due to trauma 10/23/2020   Acute sinusitis 08/19/2021   Anxiety 04/09/2020   Asthma    CAD (coronary artery disease);abnml stress MPI 04/2023; abnml CT cors 09/2023 mid LAD and RCA; s/p cath 11/11/2023 PCI of mid LAD 11/04/2023   Symptoms of dyspnea on exertion suggestive of angina and progressive over the past 2 to 3 months.  Prior abnormal stress test with nuclear imaging from September 2024  Abnormal cardiac CT imaging on 10-12-2023 with CAD RADS 3 study moderate 50 to 69% lesion of LAD and RCA with CT FFR abnormal for possible hemodynamically significant lesions in mid to distal LAD.  Calcium  score 526 and total plaque    Controlled type 2 diabetes mellitus without complication, without long-term current use of insulin (HCC) 03/30/2023   COPD (chronic obstructive pulmonary disease) (HCC)     Depression    Dyspnea on exertion 03/30/2023   Encounter for screening mammogram for breast cancer 09/26/2019   Gastroesophageal reflux disease without esophagitis 12/14/2018   GERD (gastroesophageal reflux disease)    Hypertension 11/04/2023   Kidney stones    Major depressive disorder, recurrent episode, moderate (HCC) 08/23/2018   Menorrhagia with irregular cycle 04/01/2021   Mild intermittent asthma 09/26/2019   Mixed hyperlipidemia 09/26/2019   Pain of right upper extremity 10/23/2020   Pelvic organ prolapse quantification stage 1 cystocele 04/01/2021   Status post hysteroscopy 05/21/2021   Past Surgical History:  Procedure Laterality Date   CORONARY PRESSURE/FFR STUDY N/A 11/11/2023   Procedure: CORONARY  PRESSURE/FFR STUDY;  Surgeon: Cody Das, MD;  Location: [facility];  Service: Cardiovascular;  Laterality: N/A;  LAD   CORONARY STENT INTERVENTION N/A 11/11/2023   Procedure: CORONARY STENT INTERVENTION;  Surgeon: Cody Das, MD;  Location: [facility];  Service: Cardiovascular;  Laterality: N/A;   HERNIA REPAIR  2014   LEFT HEART CATH AND CORONARY ANGIOGRAPHY N/A 11/11/2023   Procedure: LEFT HEART CATH AND CORONARY ANGIOGRAPHY;  Surgeon: Cody Das, MD;  Location: [facility];  Service: Cardiovascular;  Laterality: N/A;    Family History  Problem Relation Age of Onset   Breast cancer Mother    Leukemia Father    Heart failure Brother    Alcohol abuse Brother    Social History   Socioeconomic History   Marital status: Widowed    Spouse name: Not on file   Number of children: 1   Years of education: Not on file   Highest education level: 12th grade  Occupational History   Occupation: [employer]  Tobacco Use   Smoking status: Former    Current packs/day: 0.00    Average packs/day: 1 pack/day for 30.0 years (30.0 ttl pk-yrs)    Types: Cigarettes    Start date: 46    Quit date: 2012    Years since  quitting: 13.4   Smokeless tobacco: Never  Vaping Use   Vaping status: Never Used  Substance and Sexual Activity   Alcohol use: Yes    Alcohol/week: 2.0 standard drinks of alcohol    Types: 2 Glasses of wine per week    Comment: SOCIAL   Drug use: Never   Sexual activity: Not Currently  Other Topics Concern   Not on file  Social History Narrative   Not on file   Social Drivers of Health   Financial Resource Strain: Patient Declined (01/26/2024)   Overall Financial Resource Strain (CARDIA)    Difficulty of Paying Living Expenses: Patient declined  Food Insecurity: No Food Insecurity (01/26/2024)   Hunger Vital Sign    Worried About Running Out of Food in the Last Year: Never true    Ran Out of Food in the Last Year: Never true  Transportation Needs: No Transportation Needs (01/26/2024)   PRAPARE - Administrator, Civil Service (Medical): No    Lack of Transportation (Non-Medical): No  Physical Activity: Insufficiently Active (01/26/2024)   Exercise Vital Sign    Days of Exercise per Week: 2 days    Minutes of Exercise per Session: 10 min  Stress: Patient Declined (01/26/2024)   [employer] of Occupational Health - Occupational Stress Questionnaire    Feeling of Stress : Patient declined  Social Connections: Moderately Integrated (01/26/2024)   Social Connection and Isolation Panel [NHANES]    Frequency of Communication with Friends and Family: More than three times a week    Frequency of Social Gatherings with Friends and Family: Three times a week    Attends Religious Services: 1 to 4 times per year    Active Member of Clubs or Organizations: Yes    Attends Banker Meetings: More than 4 times per year    Marital Status: Widowed    Objective:  BP 136/70   Pulse 82   Temp 98.3 F (36.8 C) (Temporal)   Resp 16   Ht 5' 5 (1.651 m)   Wt 285 lb 9.6 oz (129.5 kg)   LMP  (LMP Unknown)  SpO2 98%   BMI 47.53 kg/m      01/26/2024   10:38 AM  01/17/2024    2:49 PM 12/21/2023   11:03 AM  BP/Weight  Systolic BP 136 118 136  Diastolic BP 70 66 88  Wt. (Lbs) 285.6 283 280.2  BMI 47.53 kg/m2 47.09 kg/m2 46.63 kg/m2    Physical Exam Vitals reviewed.  Constitutional:      General: She is not in acute distress.    Appearance: Normal appearance. She is not ill-appearing.  Eyes:     Conjunctiva/sclera: Conjunctivae normal.  Cardiovascular:     Rate and Rhythm: Normal rate and regular rhythm.     Heart sounds: Normal heart sounds. No murmur heard. Pulmonary:     Effort: Pulmonary effort is normal.     Breath sounds: Normal breath sounds. No wheezing.  Musculoskeletal:     Lumbar back: Spasms and tenderness present. Decreased range of motion.  Skin:    General: Skin is warm.  Neurological:     Mental Status: She is alert. Mental status is at baseline.  Psychiatric:        Mood and Affect: Mood normal.        Behavior: Behavior normal.     Lab Results  Component Value Date   WBC 8.1 12/21/2023   HGB 14.7 12/21/2023   HCT 46.2 12/21/2023   PLT 228 12/21/2023   GLUCOSE 110 (H) 12/21/2023   CHOL 132 12/21/2023   TRIG 128 12/21/2023   HDL 46 12/21/2023   LDLCALC 63 12/21/2023   ALT 22 12/21/2023   AST 17 12/21/2023   NA 142 12/21/2023   K 4.3 12/21/2023   CL 105 12/21/2023   CREATININE 0.57 12/21/2023   BUN 11 12/21/2023   CO2 24 12/21/2023   TSH 0.468 12/21/2023   HGBA1C 6.9 (H) 12/21/2023      Assessment & Plan:  Spondylolysis of lumbar region Assessment & Plan: Chronic lumbar spondylosis with mild disc space height loss and moderate facet arthropathy from L3 to S1. Conservative management has shown improvement. Imaging indicates progression. Surgical options considered if conservative measures fail. - Continue physical therapy, NSAIDs, and activity modification. - Refer to orthopedics for evaluation and possible treatments such as TENS unit, cupping, needling, and steroid injections. - Order MRI if needed  to further assess spinal changes if needed after ortho evalutation. - Prescribe ibuprofen 800 mg every 8 hours as needed, avoiding concurrent use with Lexapro . - Prescribe lidocaine  patches for use up to twice daily, not exceeding 12 hours per patch. - Schedule follow-up in four weeks to assess progress and potential release to work. - FMLA paperwork completed  Orders: -     Ambulatory referral to Orthopedics  Acute bilateral low back pain with right-sided sciatica Assessment & Plan: Pain continues despite Tylenol , NSAIDs and activity modificaton - Referral to ortho for evaluation and treatment - Prescribe ibuprofen 800 mg every 8 hours as needed, avoiding concurrent use with Lexapro . - Prescribe lidocaine  patches for use up to twice daily, not exceeding 12 hours per patch.  Orders: -     Lidocaine ; Place 1 patch onto the skin daily. Remove & Discard patch within 12 hours or as directed by MD  Dispense: 30 patch; Refill: 0 -     Ibuprofen; Take 1 tablet (800 mg total) by mouth every 8 (eight) hours as needed.  Dispense: 90 tablet; Refill: 0        Meds ordered this encounter  Medications   lidocaine  (  LIDODERM ) 5 %    Sig: Place 1 patch onto the skin daily. Remove & Discard patch within 12 hours or as directed by MD    Dispense:  30 patch    Refill:  0   ibuprofen (ADVIL) 800 MG tablet    Sig: Take 1 tablet (800 mg total) by mouth every 8 (eight) hours as needed.    Dispense:  90 tablet    Refill:  0    Orders Placed This Encounter  Procedures   AMB referral to orthopedics     Follow-up: Return in about 4 weeks (around 02/23/2024).   I,Angela Taylor,acting as a Neurosurgeon for Janece Means, FNP.,have documented all relevant documentation on the behalf of Janece Means, FNP,as directed by  Janece Means, FNP while in the presence of Janece Means, FNP.   An After Visit Summary was printed and given to the patient.  Total time spent on today's visit was 34 minutes, including  both face-to-face time and nonface-to-face time personally spent on review of chart (imaging), discussing goals, discussing further work-up, treatment options, referrals to specialist, answering patient's questions, and coordinating care.    I attest that I have reviewed this visit and agree with the plan scribed by my staff.   Delford Felling, FNP [facility] [phone]

## 2024-01-26 NOTE — Assessment & Plan Note (Addendum)
 Chronic lumbar spondylosis with mild disc space height loss and moderate facet arthropathy from L3 to S1. Conservative management has shown improvement. Imaging indicates progression. Surgical options considered if conservative measures fail. - Continue physical therapy, NSAIDs, and activity modification. - Refer to orthopedics for evaluation and possible treatments such as TENS unit, cupping, needling, and steroid injections. - Order MRI if needed to further assess spinal changes if needed after ortho evalutation. - Prescribe ibuprofen 800 mg every 8 hours as needed, avoiding concurrent use with Lexapro . - Prescribe lidocaine  patches for use up to twice daily, not exceeding 12 hours per patch. - Schedule follow-up in four weeks to assess progress and potential release to work. - FMLA paperwork completed

## 2024-01-26 NOTE — Assessment & Plan Note (Signed)
 Pain continues despite Tylenol , NSAIDs and activity modificaton - Referral to ortho for evaluation and treatment - Prescribe ibuprofen 800 mg every 8 hours as needed, avoiding concurrent use with Lexapro . - Prescribe lidocaine  patches for use up to twice daily, not exceeding 12 hours per patch.

## 2024-01-26 NOTE — Patient Instructions (Addendum)
 Take Tylenol  with Lexapro  in the morning Motrin (ibuprofen (ADVIL) 800 MG tablet) in the afternoon and dinner time as neeeded

## 2024-02-02 ENCOUNTER — Ambulatory Visit
Admission: RE | Admit: 2024-02-02 | Discharge: 2024-02-02 | Disposition: A | Discharge status: Home / Self Care | Source: Ambulatory Visit | Attending: Physician Assistant | Admitting: Physician Assistant

## 2024-02-02 DIAGNOSIS — Z1231 Encounter for screening mammogram for malignant neoplasm of breast: Secondary | ICD-10-CM

## 2024-02-04 ENCOUNTER — Ambulatory Visit: Payer: Self-pay | Admitting: Physician Assistant

## 2024-02-04 ENCOUNTER — Other Ambulatory Visit: Payer: Self-pay | Admitting: Physician Assistant

## 2024-02-04 DIAGNOSIS — R928 Other abnormal and inconclusive findings on diagnostic imaging of breast: Secondary | ICD-10-CM

## 2024-02-08 ENCOUNTER — Ambulatory Visit
Admission: RE | Admit: 2024-02-08 | Discharge: 2024-02-08 | Disposition: A | Discharge status: Home / Self Care | Source: Ambulatory Visit | Attending: Physician Assistant | Admitting: Physician Assistant

## 2024-02-08 ENCOUNTER — Other Ambulatory Visit: Payer: Self-pay | Admitting: Physician Assistant

## 2024-02-08 DIAGNOSIS — R928 Other abnormal and inconclusive findings on diagnostic imaging of breast: Secondary | ICD-10-CM

## 2024-02-08 DIAGNOSIS — N641 Fat necrosis of breast: Secondary | ICD-10-CM

## 2024-02-16 ENCOUNTER — Encounter

## 2024-02-16 ENCOUNTER — Other Ambulatory Visit

## 2024-02-21 ENCOUNTER — Telehealth: Payer: Self-pay

## 2024-02-21 NOTE — Telephone Encounter (Signed)
 Request for medical information to support disability

## 2024-02-24 ENCOUNTER — Ambulatory Visit (INDEPENDENT_AMBULATORY_CARE_PROVIDER_SITE_OTHER): Admitting: Family Medicine

## 2024-02-24 ENCOUNTER — Encounter: Payer: Self-pay | Admitting: Family Medicine

## 2024-02-24 VITALS — BP 134/82 | HR 83 | Temp 98.1°F | Resp 16 | Ht 65.0 in | Wt 288.0 lb

## 2024-02-24 DIAGNOSIS — M4306 Spondylolysis, lumbar region: Secondary | ICD-10-CM | POA: Insufficient documentation

## 2024-02-24 DIAGNOSIS — M5441 Lumbago with sciatica, right side: Secondary | ICD-10-CM

## 2024-02-24 DIAGNOSIS — M4316 Spondylolisthesis, lumbar region: Secondary | ICD-10-CM

## 2024-02-24 HISTORY — DX: Morbid (severe) obesity due to excess calories: E66.01

## 2024-02-24 HISTORY — DX: Spondylolisthesis, lumbar region: M43.16

## 2024-02-24 HISTORY — DX: Spondylolysis, lumbar region: M43.06

## 2024-02-24 MED ORDER — LIDOCAINE 5 % EX PTCH
1.0000 | MEDICATED_PATCH | CUTANEOUS | 0 refills | Status: DC
Start: 2024-02-24 — End: 2024-06-27

## 2024-02-24 NOTE — Patient Instructions (Signed)
 Managing Pain Without Opioids Opioids are strong medicines used to treat moderate to severe pain. For some people, especially those who have long-term (chronic) pain, opioids may not be the best choice for pain management due to: Side effects like nausea, constipation, and sleepiness. The risk of addiction (opioid use disorder). The longer you take opioids, the greater your risk of addiction. Pain that lasts for more than 3 months is called chronic pain. Managing chronic pain usually requires more than one approach and is often provided by a team of health care providers working together (multidisciplinary approach). Pain management may be done at a pain management center or pain clinic. How to manage pain without the use of opioids Use non-opioid medicines Non-opioid medicines for pain may include: Over-the-counter or prescription non-steroidal anti-inflammatory drugs (NSAIDs). These may be the first medicines used for pain. They work well for muscle and bone pain, and they reduce swelling. Acetaminophen. This over-the-counter medicine may work well for milder pain but not swelling. Antidepressants. These may be used to treat chronic pain. A certain type of antidepressant (tricyclics) is often used. These medicines are given in lower doses for pain than when used for depression. Anticonvulsants. These are usually used to treat seizures but may also reduce nerve (neuropathic) pain. Muscle relaxants. These relieve pain caused by sudden muscle tightening (spasms). You may also use a pain medicine that is applied to the skin as a patch, cream, or gel (topical analgesic), such as a numbing medicine. These may cause fewer side effects than medicines taken by mouth. Do certain therapies as directed Some therapies can help with pain management. They include: Physical therapy. You will do exercises to gain strength and flexibility. A physical therapist may teach you exercises to move and stretch parts of  your body that are weak, stiff, or painful. You can learn these exercises at physical therapy visits and practice them at home. Physical therapy may also involve: Massage. Heat wraps or applying heat or cold to affected areas. Electrical signals that interrupt pain signals (transcutaneous electrical nerve stimulation, TENS). Weak lasers that reduce pain and swelling (low-level laser therapy). Signals from your body that help you learn to regulate pain (biofeedback). Occupational therapy. This helps you to learn ways to function at home and work with less pain. Recreational therapy. This involves trying new activities or hobbies, such as a physical activity or drawing. Mental health therapy, including: Cognitive behavioral therapy (CBT). This helps you learn coping skills for dealing with pain. Acceptance and commitment therapy (ACT) to change the way you think and react to pain. Relaxation therapies, including muscle relaxation exercises and mindfulness-based stress reduction. Pain management counseling. This may be individual, family, or group counseling.  Receive medical treatments Medical treatments for pain management include: Nerve block injections. These may include a pain blocker and anti-inflammatory medicines. You may have injections: Near the spine to relieve chronic back or neck pain. Into joints to relieve back or joint pain. Into nerve areas that supply a painful area to relieve body pain. Into muscles (trigger point injections) to relieve some painful muscle conditions. A medical device placed near your spine to help block pain signals and relieve nerve pain or chronic back pain (spinal cord stimulation device). Acupuncture. Follow these instructions at home Medicines Take over-the-counter and prescription medicines only as told by your health care provider. If you are taking pain medicine, ask your health care providers about possible side effects to watch out for. Do not  drive or use heavy machinery  while taking prescription opioid pain medicine. Lifestyle  Do not use drugs or alcohol to reduce pain. If you drink alcohol, limit how much you have to: 0-1 drink a day for women who are not pregnant. 0-2 drinks a day for men. Know how much alcohol is in a drink. In the [place] one drink equals one 12 oz bottle of beer (355 mL), one 5 oz glass of wine (148 mL), or one 1 oz glass of hard liquor (44 mL). Do not use any products that contain nicotine or tobacco. These products include cigarettes, chewing tobacco, and vaping devices, such as e-cigarettes. If you need help quitting, ask your health care provider. Eat a healthy diet and maintain a healthy weight. Poor diet and excess weight may make pain worse. Eat foods that are high in fiber. These include fresh fruits and vegetables, whole grains, and beans. Limit foods that are high in fat and processed sugars, such as fried and sweet foods. Exercise regularly. Exercise lowers stress and may help relieve pain. Ask your health care provider what activities and exercises are safe for you. If your health care provider approves, join an exercise class that combines movement and stress reduction. Examples include yoga and tai chi. Get enough sleep. Lack of sleep may make pain worse. Lower stress as much as possible. Practice stress reduction techniques as told by your therapist. General instructions Work with all your pain management providers to find the treatments that work best for you. You are an important member of your pain management team. There are many things you can do to reduce pain on your own. Consider joining an online or in-person support group for people who have chronic pain. Keep all follow-up visits. This is important. Where to find more information You can find more information about managing pain without opioids from: American Academy of Pain Medicine: painmed.org Institute for Chronic Pain:  instituteforchronicpain.org American Chronic Pain Association: theacpa.org Contact a health care provider if: You have side effects from pain medicine. Your pain gets worse or does not get better with treatments or home therapy. You are struggling with anxiety or depression. Summary Many types of pain can be managed without opioids. Chronic pain may respond better to pain management without opioids. Pain is best managed when you and a team of health care providers work together. Pain management without opioids may include non-opioid medicines, medical treatments, physical therapy, mental health therapy, and lifestyle changes. Tell your health care providers if your pain gets worse or is not being managed well enough. This information is not intended to replace advice given to you by your health care provider. Make sure you discuss any questions you have with your health care provider. Document Revised: 11/13/2020 Document Reviewed: 11/13/2020 Elsevier Patient Education  2024 [employer].

## 2024-02-24 NOTE — Assessment & Plan Note (Addendum)
 Pain radiating down the right leg consistent with sciatica. Functional limitations include difficulty standing, lifting, and climbing. Improvement noted but discomfort persists. Discussed potential treatments with ortho. - Refer to [facility] for further evaluation and management. - Continue ibuprofen  as needed for pain management. - Prescribe lidocaine  patches for pain relief. - Discuss potential treatments with ortho, including physical therapy, TENS unit, and steroid injections.  - Paperwork filled out for work restrictions - FU appointment March 09, 2024

## 2024-02-24 NOTE — Assessment & Plan Note (Signed)
 BMI 47.93, not at goal - Continue to work on diet  - Discontinue strenuous exercise until low back back is resolved

## 2024-02-24 NOTE — Assessment & Plan Note (Addendum)
 Improved - Continue using lidocaine  and motrin  800 mg as needed  - Lumbar xray completed - Use ice and heat as needed  - Take it slow and do not life anything over 10 pounds - Referral to [facility]   IMPRESSION: 1. 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 No L5 spondylolisthesis. 2. Grade 1 anterolisthesis of L4 is new since 2023.

## 2024-02-24 NOTE — Progress Notes (Signed)
 Subjective:  Patient ID: Kristen Vazquez, female    DOB: 02/13/1966  Age: 58 y.o. MRN: [id number]  Chief Complaint  Patient presents with   Back Pain    4 week follow up    Discussed the use of AI scribe software for clinical note transcription with the patient, who gave verbal consent to proceed.  History of Present Illness   Her back pain began on Jan 14, 2024, The pain is described as 'twingy moments' that worsen with movement, particularly after a shower or increased activity. It is most severe upon waking and after being active during the day, and it also bothers her at night.   She has been using lidocaine  patches, which have provided some relief, allowing her to sit comfortably and move around more in her kitchen.    Sleeping in her bed aggravates the pain, whereas sleeping in a recliner with a pillow behind her back is less painful. She has been using one lidocaine  patch daily.   States there have been days that she has not had to take any medications for pain and feels she is improving.  Lumbar radicular pain - Onset May 30th after stepping out of the shower, with a sharp pain shooting up her back - Pain primarily localized to the right side of the back, radiating down the right leg - Difficulty walking on cement floors and after showering - Stairs are challenging - Cautious with twisting, turning, and bending to avoid exacerbation - Some improvement in ability to sit and lift light objects such as a laundry basket - No return to work since the injury due to pain and functional limitations  Functional impairment - Unable to perform job functions including climbing ladders, lifting more than ten pounds, and pulling heavy carts - Has not returned to work since the injury - Attempts to stay active without overexertion to avoid stiffness  Pain management - Uses ibuprofen  and lidocaine  patches for pain control - Avoids daily ibuprofen  due to existing medication regimen -  Stretches use of lidocaine  patches by going a few days without them  Imaging and referrals - Lumbar spine x-ray performed showed  IMPRESSION: 1. 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 No L5 spondylolisthesis. 2. Grade 1 anterolisthesis of L4 is new since 2023. - Referral to orthopedic department for physical therapy made, but no contact received from [facility].          12/21/2023   11:06 AM 09/16/2023    9:18 AM 03/16/2023   10:54 AM 11/10/2022    9:02 AM 07/07/2022    8:58 AM  Depression screen PHQ 2/9  Decreased Interest 0 0 0 0 0  Down, Depressed, Hopeless 0 0 0 0 0  PHQ - 2 Score 0 0 0 0 0  Altered sleeping 0 0 1 0 0  Tired, decreased energy 0 0 1 0 0  Change in appetite 0 0 0 0 0  Feeling bad or failure about yourself  0 0 0 0 0  Trouble concentrating 0 0 0 0 0  Moving slowly or fidgety/restless 0 0 0 0 0  Suicidal thoughts 0 0 0 0 0  PHQ-9 Score 0 0 2 0 0  Difficult doing work/chores Not difficult at all Not difficult at all Not difficult at all Not difficult at all Not difficult at all        12/21/2023   11:06 AM  Fall Risk   Falls in the past year? 0  Number falls in past yr: 0  Injury with Fall? 0  Risk for fall due to : No Fall Risks  Follow up Falls evaluation completed    Patient Care Team: Nicholaus Credit, PA-C as PCP - General (Physician Assistant) Madireddy, Alean SAUNDERS, MD as PCP - Cardiology (Cardiology)   Review of Systems  Constitutional:  Negative for chills, fatigue and fever.  HENT:  Negative for congestion, ear pain, rhinorrhea and sore throat.   Respiratory:  Negative for cough and shortness of breath.   Cardiovascular:  Negative for chest pain.  Gastrointestinal:  Negative for abdominal pain, constipation, diarrhea, nausea and vomiting.  Genitourinary:  Negative for dysuria and urgency.  Musculoskeletal:  Positive for back pain. Negative for myalgias.  Neurological:  Negative for dizziness, weakness,  light-headedness and headaches.  Psychiatric/Behavioral:  Negative for dysphoric mood. The patient is not nervous/anxious.     Current Outpatient Medications on File Prior to Visit  Medication Sig Dispense Refill   Albuterol -Budesonide  (AIRSUPRA ) 90-80 MCG/ACT AERO Inhale 2 puffs into the lungs 4 (four) times daily as needed. 10.7 g 5   amLODipine  (NORVASC ) 2.5 MG tablet Take 1 tablet (2.5 mg total) by mouth daily. 180 tablet 3   aspirin  EC 81 MG tablet Take 1 tablet (81 mg total) by mouth daily. Swallow whole. 90 tablet 3   BREZTRI  AEROSPHERE 160-9-4.8 MCG/ACT AERO inhaler INHALE 2 PUFFS IN THE MORNING AND AT BEDTIME 11 g 0   clopidogrel  (PLAVIX ) 75 MG tablet Take 1 tablet (75 mg total) by mouth daily. 90 tablet 3   escitalopram  (LEXAPRO ) 20 MG tablet Take 1 tablet by mouth once daily 90 tablet 0   fluticasone  (FLONASE ) 50 MCG/ACT nasal spray Place 2 sprays into both nostrils daily. 16 g 6   ibuprofen  (ADVIL ) 800 MG tablet Take 1 tablet (800 mg total) by mouth every 8 (eight) hours as needed. 90 tablet 0   levothyroxine  (SYNTHROID ) 125 MCG tablet Take 1 tablet by mouth once daily 90 tablet 0   LORazepam  (ATIVAN ) 0.5 MG tablet Take 1 tablet (0.5 mg total) by mouth daily as needed for anxiety. 30 tablet 0   metoprolol  succinate (TOPROL -XL) 25 MG 24 hr tablet Take 1 tablet by mouth once daily 90 tablet 0   nitroGLYCERIN  (NITROSTAT ) 0.4 MG SL tablet Place 1 tablet (0.4 mg total) under the tongue every 5 (five) minutes as needed for chest pain. 25 tablet 1   pantoprazole  (PROTONIX ) 40 MG tablet TAKE 1 TABLET BY MOUTH ONCE DAILY 90 tablet 1   rosuvastatin  (CRESTOR ) 20 MG tablet Take 1 tablet (20 mg total) by mouth daily. 90 tablet 1   Vitamin D , Ergocalciferol , (DRISDOL ) 1.25 MG (50000 UNIT) CAPS capsule Take 1 capsule (50,000 Units total) by mouth every 7 (seven) days. 5 capsule 5   No current facility-administered medications on file prior to visit.   Past Medical History:  Diagnosis Date    Abnormal blood chemistry 01/02/2020   Acquired hypothyroidism 12/14/2018   Acute laryngopharyngitis 01/02/2020   Acute pain of right shoulder due to trauma 10/23/2020   Acute sinusitis 08/19/2021   Anxiety 04/09/2020   Asthma    CAD (coronary artery disease);abnml stress MPI 04/2023; abnml CT cors 09/2023 mid LAD and RCA; s/p cath 11/11/2023 PCI of mid LAD 11/04/2023   Symptoms of dyspnea on exertion suggestive of angina and progressive over the past 2 to 3 months.  Prior abnormal stress test with nuclear imaging from September 2024  Abnormal cardiac CT imaging on 10-12-2023  with CAD RADS 3 study moderate 50 to 69% lesion of LAD and RCA with CT FFR abnormal for possible hemodynamically significant lesions in mid to distal LAD.  Calcium  score 526 and total plaque    Controlled type 2 diabetes mellitus without complication, without long-term current use of insulin (HCC) 03/30/2023   COPD (chronic obstructive pulmonary disease) (HCC)    Depression    Dyspnea on exertion 03/30/2023   Encounter for screening mammogram for breast cancer 09/26/2019   Gastroesophageal reflux disease without esophagitis 12/14/2018   GERD (gastroesophageal reflux disease)    Hypertension 11/04/2023   Kidney stones    Major depressive disorder, recurrent episode, moderate (HCC) 08/23/2018   Menorrhagia with irregular cycle 04/01/2021   Mild intermittent asthma 09/26/2019   Mixed hyperlipidemia 09/26/2019   Pain of right upper extremity 10/23/2020   Pelvic organ prolapse quantification stage 1 cystocele 04/01/2021   Status post hysteroscopy 05/21/2021   Past Surgical History:  Procedure Laterality Date   CORONARY PRESSURE/FFR STUDY N/A 11/11/2023   Procedure: CORONARY PRESSURE/FFR STUDY;  Surgeon: Elmira Newman PARAS, MD;  Location: [facility];  Service: Cardiovascular;  Laterality: N/A;  LAD   CORONARY STENT INTERVENTION N/A 11/11/2023   Procedure: CORONARY STENT INTERVENTION;  Surgeon: Elmira Newman PARAS,  MD;  Location: [facility];  Service: Cardiovascular;  Laterality: N/A;   HERNIA REPAIR  2014   LEFT HEART CATH AND CORONARY ANGIOGRAPHY N/A 11/11/2023   Procedure: LEFT HEART CATH AND CORONARY ANGIOGRAPHY;  Surgeon: Elmira Newman PARAS, MD;  Location: [facility];  Service: Cardiovascular;  Laterality: N/A;    Family History  Problem Relation Age of Onset   Breast cancer Mother    Leukemia Father    Heart failure Brother    Alcohol abuse Brother    Social History   Socioeconomic History   Marital status: Widowed    Spouse name: Not on file   Number of children: 1   Years of education: Not on file   Highest education level: 12th grade  Occupational History   Occupation: [employer]  Tobacco Use   Smoking status: Former    Current packs/day: 0.00    Average packs/day: 1 pack/day for 30.0 years (30.0 ttl pk-yrs)    Types: Cigarettes    Start date: 63    Quit date: 2012    Years since quitting: 13.5   Smokeless tobacco: Never  Vaping Use   Vaping status: Never Used  Substance and Sexual Activity   Alcohol use: Yes    Alcohol/week: 2.0 standard drinks of alcohol    Types: 2 Glasses of wine per week    Comment: SOCIAL   Drug use: Never   Sexual activity: Not Currently  Other Topics Concern   Not on file  Social History Narrative   Not on file   Social Drivers of Health   Financial Resource Strain: Patient Declined (01/26/2024)   Overall Financial Resource Strain (CARDIA)    Difficulty of Paying Living Expenses: Patient declined  Food Insecurity: No Food Insecurity (01/26/2024)   Hunger Vital Sign    Worried About Running Out of Food in the Last Year: Never true    Ran Out of Food in the Last Year: Never true  Transportation Needs: No Transportation Needs (01/26/2024)   PRAPARE - Administrator, Civil Service (Medical): No    Lack of Transportation (Non-Medical): No  Physical Activity: Insufficiently Active (01/26/2024)   Exercise  Vital Sign  Days of Exercise per Week: 2 days    Minutes of Exercise per Session: 10 min  Stress: Patient Declined (01/26/2024)   [employer] of Occupational Health - Occupational Stress Questionnaire    Feeling of Stress : Patient declined  Social Connections: Moderately Integrated (01/26/2024)   Social Connection and Isolation Panel    Frequency of Communication with Friends and Family: More than three times a week    Frequency of Social Gatherings with Friends and Family: Three times a week    Attends Religious Services: 1 to 4 times per year    Active Member of Clubs or Organizations: Yes    Attends Banker Meetings: More than 4 times per year    Marital Status: Widowed    Objective:  BP 134/82   Pulse 83   Temp 98.1 F (36.7 C)   Resp 16   Ht 5' 5 (1.651 m)   Wt 288 lb (130.6 kg)   LMP  (LMP Unknown)   SpO2 98%   BMI 47.93 kg/m      02/24/2024   10:23 AM 01/26/2024   10:38 AM 01/17/2024    2:49 PM  BP/Weight  Systolic BP 134 136 118  Diastolic BP 82 70 66  Wt. (Lbs) 288 285.6 283  BMI 47.93 kg/m2 47.53 kg/m2 47.09 kg/m2    Physical Exam Vitals reviewed.  Constitutional:      General: She is not in acute distress.    Appearance: Normal appearance. She is not ill-appearing.  Eyes:     Conjunctiva/sclera: Conjunctivae normal.  Neck:     Vascular: No carotid bruit.  Cardiovascular:     Rate and Rhythm: Normal rate and regular rhythm.     Heart sounds: Normal heart sounds. No murmur heard. Pulmonary:     Effort: Pulmonary effort is normal.     Breath sounds: Normal breath sounds. No wheezing.  Musculoskeletal:     Lumbar back: Spasms present. Decreased range of motion.  Skin:    General: Skin is warm.  Neurological:     Mental Status: She is alert. Mental status is at baseline.  Psychiatric:        Mood and Affect: Mood normal.        Behavior: Behavior normal.     Lab Results  Component Value Date   WBC 8.1 12/21/2023   HGB 14.7  12/21/2023   HCT 46.2 12/21/2023   PLT 228 12/21/2023   GLUCOSE 110 (H) 12/21/2023   CHOL 132 12/21/2023   TRIG 128 12/21/2023   HDL 46 12/21/2023   LDLCALC 63 12/21/2023   ALT 22 12/21/2023   AST 17 12/21/2023   NA 142 12/21/2023   K 4.3 12/21/2023   CL 105 12/21/2023   CREATININE 0.57 12/21/2023   BUN 11 12/21/2023   CO2 24 12/21/2023   TSH 0.468 12/21/2023   HGBA1C 6.9 (H) 12/21/2023      Assessment & Plan:  Spondylolysis, lumbar region Assessment & Plan: Improved - Continue using lidocaine  and motrin  800 mg as needed  - Lumbar xray completed - Use ice and heat as needed  - Take it slow and do not life anything over 10 pounds - Referral to [facility]   IMPRESSION: 1. 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 No L5 spondylolisthesis. 2. Grade 1 anterolisthesis of L4 is new since 2023.   Anterolisthesis of lumbar spine Assessment & Plan: Improved - Continue using lidocaine  and motrin  800 mg as needed  -  Lumbar xray completed - Use ice and heat as needed  - Take it slow and do not life anything over 10 pounds - Referral to [facility]   IMPRESSION: 1. 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 No L5 spondylolisthesis. 2. Grade 1 anterolisthesis of L4 is new since 2023.   Acute bilateral low back pain with right-sided sciatica Assessment & Plan: Pain radiating down the right leg consistent with sciatica. Functional limitations include difficulty standing, lifting, and climbing. Improvement noted but discomfort persists. Discussed potential treatments with ortho. - Refer to [facility] for further evaluation and management. - Continue ibuprofen  as needed for pain management. - Prescribe lidocaine  patches for pain relief. - Discuss potential treatments with ortho, including physical therapy, TENS unit, and steroid injections.  - Paperwork filled out for work restrictions - FU appointment March 09, 2024  Orders: -     Lidocaine ; Place 1 patch onto the skin daily. Remove & Discard patch within 12 hours or as directed by MD  Dispense: 30 patch; Refill: 0  Morbid obesity (HCC) Assessment & Plan: BMI 47.93, not at goal - Continue to work on diet  - Discontinue strenuous exercise until low back back is resolved      Meds ordered this encounter  Medications   lidocaine  (LIDODERM ) 5 %    Sig: Place 1 patch onto the skin daily. Remove & Discard patch within 12 hours or as directed by MD    Dispense:  30 patch    Refill:  0    No orders of the defined types were placed in this encounter.    Follow-up: Return in about 2 weeks (around 03/09/2024).   I,Katherina A Bramblett,acting as a scribe for Harrie CHRISTELLA Cedar, FNP.,have documented all relevant documentation on the behalf of Harrie CHRISTELLA Cedar, FNP,as directed by  Harrie CHRISTELLA Cedar, FNP while in the presence of Harrie CHRISTELLA Cedar, FNP.   An After Visit Summary was printed and given to the patient. Harrie Cedar, FNP [facility] [phone]

## 2024-02-28 ENCOUNTER — Ambulatory Visit: Admitting: Family Medicine

## 2024-03-07 ENCOUNTER — Other Ambulatory Visit (HOSPITAL_COMMUNITY): Payer: Self-pay

## 2024-03-07 ENCOUNTER — Other Ambulatory Visit: Payer: Self-pay

## 2024-03-09 ENCOUNTER — Ambulatory Visit (INDEPENDENT_AMBULATORY_CARE_PROVIDER_SITE_OTHER): Admitting: Family Medicine

## 2024-03-09 ENCOUNTER — Encounter: Payer: Self-pay | Admitting: Family Medicine

## 2024-03-09 VITALS — BP 108/68 | HR 72 | Temp 97.8°F | Resp 18 | Ht 65.0 in | Wt 290.0 lb

## 2024-03-09 DIAGNOSIS — R258 Other abnormal involuntary movements: Secondary | ICD-10-CM | POA: Insufficient documentation

## 2024-03-09 DIAGNOSIS — I270 Primary pulmonary hypertension: Secondary | ICD-10-CM

## 2024-03-09 DIAGNOSIS — M5441 Lumbago with sciatica, right side: Secondary | ICD-10-CM

## 2024-03-09 HISTORY — DX: Primary pulmonary hypertension: I27.0

## 2024-03-09 HISTORY — DX: Other abnormal involuntary movements: R25.8

## 2024-03-09 MED ORDER — AMLODIPINE BESYLATE 2.5 MG PO TABS: 2.5000 mg | ORAL_TABLET | Freq: Every day | ORAL | 3 refills | Status: AC

## 2024-03-09 NOTE — Assessment & Plan Note (Signed)
 Right foot clonus suggests possible upper motor neuron involvement. Differential includes spinal injury or neurological conditions. MRI may clarify. - Order MRI for upper motor neuron evaluation. - Consider further neurological evaluation based on MRI. - Discuss multiple sclerosis concerns.

## 2024-03-09 NOTE — Progress Notes (Signed)
 Subjective:  Patient ID: Kristen Vazquez, female    DOB: [birth year]  Age: 58 y.o. MRN: [id number]  Chief Complaint  Patient presents with   Back Pain    Two week follow up   Discussed the use of AI scribe software for clinical note transcription with the patient, who gave verbal consent to proceed.  History of Present Illness   Kristen Vazquez is a 58 year old female who presents with back pain and clonus in the right foot.  She experiences ongoing back pain that worsens with movements such as bending and twisting. She reports that when another provider pressed on her lower back, it was extremely painful, and she described the sensation as feeling like her fingers would go right through. She uses lightening patches for pain management, which she finds effective. There has been no recent worsening of the pain.  She mentions clonus in the right foot, which was observed by another healthcare provider. She describes a twitch in the foot that she had not noticed before it was pointed out to her. She is awaiting an MRI to further investigate the cause of the clonus and back pain.  She experiences diarrhea, which she attributes to anxiety and nervousness over her medical condition and upcoming results. She has a history of migraines but reports no change in frequency or severity.  She is currently on amlodipine , which she needs a refill for. She is also using lidocaine  patches for pain relief.  Her husband had multiple sclerosis and passed away, which adds to her anxiety about her symptoms. She is concerned about the implications of her symptoms but is trying to avoid unnecessary worry.          12/21/2023   11:06 AM 09/16/2023    9:18 AM 03/16/2023   10:54 AM 11/10/2022    9:02 AM 07/07/2022    8:58 AM  Depression screen PHQ 2/9  Decreased Interest 0 0 0 0 0  Down, Depressed, Hopeless 0 0 0 0 0  PHQ - 2 Score 0 0 0 0 0  Altered sleeping 0 0 1 0 0  Tired, decreased energy 0 0 1 0 0   Change in appetite 0 0 0 0 0  Feeling bad or failure about yourself  0 0 0 0 0  Trouble concentrating 0 0 0 0 0  Moving slowly or fidgety/restless 0 0 0 0 0  Suicidal thoughts 0 0 0 0 0  PHQ-9 Score 0 0 2 0 0  Difficult doing work/chores Not difficult at all Not difficult at all Not difficult at all Not difficult at all Not difficult at all        12/21/2023   11:06 AM  Fall Risk   Falls in the past year? 0  Number falls in past yr: 0  Injury with Fall? 0  Risk for fall due to : No Fall Risks  Follow up Falls evaluation completed    Patient Care Team: Nicholaus Credit, PA-C as PCP - General (Physician Assistant) Madireddy, Alean SAUNDERS, MD as PCP - Cardiology (Cardiology)   Review of Systems  Constitutional:  Negative for chills, diaphoresis, fatigue and fever.  HENT:  Negative for congestion, ear pain and sinus pain.   Eyes: Negative.   Respiratory:  Negative for cough and shortness of breath.   Cardiovascular:  Negative for chest pain.  Gastrointestinal:  Positive for diarrhea. Negative for abdominal pain, constipation, nausea and vomiting.  Endocrine: Negative.   Genitourinary:  Negative  for dysuria, frequency and urgency.  Musculoskeletal:  Positive for back pain. Negative for arthralgias.  Skin: Negative.   Allergic/Immunologic: Negative.   Neurological:  Negative for dizziness, weakness, light-headedness and headaches.  Psychiatric/Behavioral:  Negative for dysphoric mood. The patient is not nervous/anxious.     Current Outpatient Medications on File Prior to Visit  Medication Sig Dispense Refill   Albuterol -Budesonide  (AIRSUPRA ) 90-80 MCG/ACT AERO Inhale 2 puffs into the lungs 4 (four) times daily as needed. 10.7 g 5   aspirin  EC 81 MG tablet Take 1 tablet (81 mg total) by mouth daily. Swallow whole. 90 tablet 3   BREZTRI  AEROSPHERE 160-9-4.8 MCG/ACT AERO inhaler INHALE 2 PUFFS IN THE MORNING AND AT BEDTIME 11 g 0   clopidogrel  (PLAVIX ) 75 MG tablet Take 1 tablet (75 mg  total) by mouth daily. 90 tablet 3   diazepam (VALIUM) 5 MG tablet Take 5 mg by mouth 2 (two) times daily as needed for anxiety. To be taken prior to MRI procedure     escitalopram  (LEXAPRO ) 20 MG tablet Take 1 tablet by mouth once daily 90 tablet 0   fluticasone  (FLONASE ) 50 MCG/ACT nasal spray Place 2 sprays into both nostrils daily. 16 g 6   ibuprofen  (ADVIL ) 800 MG tablet Take 1 tablet (800 mg total) by mouth every 8 (eight) hours as needed. 90 tablet 0   levothyroxine  (SYNTHROID ) 125 MCG tablet Take 1 tablet by mouth once daily 90 tablet 0   lidocaine  (LIDODERM ) 5 % Place 1 patch onto the skin daily. Remove & Discard patch within 12 hours or as directed by MD 30 patch 0   LORazepam  (ATIVAN ) 0.5 MG tablet Take 1 tablet (0.5 mg total) by mouth daily as needed for anxiety. 30 tablet 0   metoprolol  succinate (TOPROL -XL) 25 MG 24 hr tablet Take 1 tablet by mouth once daily 90 tablet 0   nitroGLYCERIN  (NITROSTAT ) 0.4 MG SL tablet Place 1 tablet (0.4 mg total) under the tongue every 5 (five) minutes as needed for chest pain. 25 tablet 1   pantoprazole  (PROTONIX ) 40 MG tablet TAKE 1 TABLET BY MOUTH ONCE DAILY 90 tablet 1   rosuvastatin  (CRESTOR ) 20 MG tablet Take 1 tablet (20 mg total) by mouth daily. 90 tablet 1   Vitamin D , Ergocalciferol , (DRISDOL ) 1.25 MG (50000 UNIT) CAPS capsule Take 1 capsule (50,000 Units total) by mouth every 7 (seven) days. 5 capsule 5   No current facility-administered medications on file prior to visit.   Past Medical History:  Diagnosis Date   Abnormal blood chemistry 01/02/2020   Acquired hypothyroidism 12/14/2018   Acute laryngopharyngitis 01/02/2020   Acute pain of right shoulder due to trauma 10/23/2020   Acute sinusitis 08/19/2021   Anxiety 04/09/2020   Asthma    CAD (coronary artery disease);abnml stress MPI 04/2023; abnml CT cors 09/2023 mid LAD and RCA; s/p cath 11/11/2023 PCI of mid LAD 11/04/2023   Symptoms of dyspnea on exertion suggestive of angina and  progressive over the past 2 to 3 months.  Prior abnormal stress test with nuclear imaging from September 2024  Abnormal cardiac CT imaging on 10-12-2023 with CAD RADS 3 study moderate 50 to 69% lesion of LAD and RCA with CT FFR abnormal for possible hemodynamically significant lesions in mid to distal LAD.  Calcium  score 526 and total plaque    Controlled type 2 diabetes mellitus without complication, without long-term current use of insulin (HCC) 03/30/2023   COPD (chronic obstructive pulmonary disease) (HCC)    Depression  Dyspnea on exertion 03/30/2023   Encounter for screening mammogram for breast cancer 09/26/2019   Gastroesophageal reflux disease without esophagitis 12/14/2018   GERD (gastroesophageal reflux disease)    Hypertension 11/04/2023   Kidney stones    Major depressive disorder, recurrent episode, moderate (HCC) 08/23/2018   Menorrhagia with irregular cycle 04/01/2021   Mild intermittent asthma 09/26/2019   Mixed hyperlipidemia 09/26/2019   Pain of right upper extremity 10/23/2020   Pelvic organ prolapse quantification stage 1 cystocele 04/01/2021   Status post hysteroscopy 05/21/2021   Past Surgical History:  Procedure Laterality Date   CORONARY PRESSURE/FFR STUDY N/A 11/11/2023   Procedure: CORONARY PRESSURE/FFR STUDY;  Surgeon: Elmira Newman PARAS, MD;  Location: [facility];  Service: Cardiovascular;  Laterality: N/A;  LAD   CORONARY STENT INTERVENTION N/A 11/11/2023   Procedure: CORONARY STENT INTERVENTION;  Surgeon: Elmira Newman PARAS, MD;  Location: [facility];  Service: Cardiovascular;  Laterality: N/A;   HERNIA REPAIR  2014   LEFT HEART CATH AND CORONARY ANGIOGRAPHY N/A 11/11/2023   Procedure: LEFT HEART CATH AND CORONARY ANGIOGRAPHY;  Surgeon: Elmira Newman PARAS, MD;  Location: [facility];  Service: Cardiovascular;  Laterality: N/A;    Family History  Problem Relation Age of Onset   Breast cancer Mother    Leukemia Father    Heart  failure Brother    Alcohol abuse Brother    Social History   Socioeconomic History   Marital status: Widowed    Spouse name: Not on file   Number of children: 1   Years of education: Not on file   Highest education level: 12th grade  Occupational History   Occupation: [employer]  Tobacco Use   Smoking status: Former    Current packs/day: 0.00    Average packs/day: 1 pack/day for 30.0 years (30.0 ttl pk-yrs)    Types: Cigarettes    Start date: 44    Quit date: 2012    Years since quitting: 13.5   Smokeless tobacco: Never  Vaping Use   Vaping status: Never Used  Substance and Sexual Activity   Alcohol use: Yes    Alcohol/week: 2.0 standard drinks of alcohol    Types: 2 Glasses of wine per week    Comment: SOCIAL   Drug use: Never   Sexual activity: Not Currently  Other Topics Concern   Not on file  Social History Narrative   Not on file   Social Drivers of Health   Financial Resource Strain: Patient Declined (01/26/2024)   Overall Financial Resource Strain (CARDIA)    Difficulty of Paying Living Expenses: Patient declined  Food Insecurity: No Food Insecurity (01/26/2024)   Hunger Vital Sign    Worried About Running Out of Food in the Last Year: Never true    Ran Out of Food in the Last Year: Never true  Transportation Needs: No Transportation Needs (01/26/2024)   PRAPARE - Administrator, Civil Service (Medical): No    Lack of Transportation (Non-Medical): No  Physical Activity: Insufficiently Active (01/26/2024)   Exercise Vital Sign    Days of Exercise per Week: 2 days    Minutes of Exercise per Session: 10 min  Stress: Patient Declined (01/26/2024)   [employer] of Occupational Health - Occupational Stress Questionnaire    Feeling of Stress : Patient declined  Social Connections: Moderately Integrated (01/26/2024)   Social Connection and Isolation Panel    Frequency of Communication with Friends and Family: More  than three times a week     Frequency of Social Gatherings with Friends and Family: Three times a week    Attends Religious Services: 1 to 4 times per year    Active Member of Clubs or Organizations: Yes    Attends Banker Meetings: More than 4 times per year    Marital Status: Widowed    Objective:  BP 108/68   Pulse 72   Temp 97.8 F (36.6 C) (Temporal)   Resp 18   Ht 5' 5 (1.651 m)   Wt 290 lb (131.5 kg)   LMP  (LMP Unknown)   SpO2 97%   BMI 48.26 kg/m      03/09/2024    9:56 AM 02/24/2024   10:23 AM 01/26/2024   10:38 AM  BP/Weight  Systolic BP 108 134 136  Diastolic BP 68 82 70  Wt. (Lbs) 290 288 285.6  BMI 48.26 kg/m2 47.93 kg/m2 47.53 kg/m2    Physical Exam Vitals reviewed.  Constitutional:      General: She is not in acute distress.    Appearance: Normal appearance. She is obese. She is not ill-appearing.  Eyes:     Conjunctiva/sclera: Conjunctivae normal.  Neck:     Vascular: No carotid bruit.  Cardiovascular:     Rate and Rhythm: Normal rate and regular rhythm.     Heart sounds: Normal heart sounds.  Pulmonary:     Effort: Pulmonary effort is normal.     Breath sounds: Normal breath sounds.  Abdominal:     General: Bowel sounds are normal.     Palpations: Abdomen is soft.     Tenderness: There is no abdominal tenderness.  Musculoskeletal:     Lumbar back: Tenderness present. No spasms. Decreased range of motion.  Neurological:     Mental Status: She is alert. Mental status is at baseline.  Psychiatric:        Mood and Affect: Mood normal.        Behavior: Behavior normal.      Lab Results  Component Value Date   WBC 8.1 12/21/2023   HGB 14.7 12/21/2023   HCT 46.2 12/21/2023   PLT 228 12/21/2023   GLUCOSE 110 (H) 12/21/2023   CHOL 132 12/21/2023   TRIG 128 12/21/2023   HDL 46 12/21/2023   LDLCALC 63 12/21/2023   ALT 22 12/21/2023   AST 17 12/21/2023   NA 142 12/21/2023   K 4.3 12/21/2023   CL 105 12/21/2023   CREATININE 0.57 12/21/2023   BUN  11 12/21/2023   CO2 24 12/21/2023   TSH 0.468 12/21/2023   HGBA1C 6.9 (H) 12/21/2023      Assessment & Plan:  Pulmonary hypertension, primary (HCC) Assessment & Plan: Well controlled - Continue taking amlodipine  2.5 mg once daily BP Readings from Last 3 Encounters:  03/09/24 108/68  02/24/24 134/82  01/26/24 136/70    Refill sent   Orders: -     amLODIPine  Besylate; Take 1 tablet (2.5 mg total) by mouth daily.  Dispense: 180 tablet; Refill: 3  Clonus Assessment & Plan: Right foot clonus suggests possible upper motor neuron involvement. Differential includes spinal injury or neurological conditions. MRI may clarify. - Order MRI for upper motor neuron evaluation. - Consider further neurological evaluation based on MRI. - Discuss multiple sclerosis concerns.   Acute bilateral low back pain with right-sided sciatica Assessment & Plan: Chronic back pain with decreased range of motion and tenderness. MRI recommended by orthopedics. Lidocaine  patches effective. -  Await MRI approval and scheduling. - Consider lumbar injection based on MRI results. - Continue lidocaine  patches.      Meds ordered this encounter  Medications   amLODipine  (NORVASC ) 2.5 MG tablet    Sig: Take 1 tablet (2.5 mg total) by mouth daily.    Dispense:  180 tablet    Refill:  3    No orders of the defined types were placed in this encounter.         Follow-up: Return if symptoms worsen or fail to improve, for keep appt with Ginnie Moats in November.   I,Angela Taylor,acting as a Neurosurgeon for Harrie CHRISTELLA Cedar, FNP.,have documented all relevant documentation on the behalf of Harrie CHRISTELLA Cedar, FNP,as directed by  Harrie CHRISTELLA Cedar, FNP while in the presence of Harrie CHRISTELLA Cedar, FNP.   An After Visit Summary was printed and given to the patient.  I attest that I have reviewed this visit and agree with the plan scribed by my staff.   Harrie CHRISTELLA Cedar, FNP [facility] [phone]

## 2024-03-09 NOTE — Assessment & Plan Note (Signed)
 Chronic back pain with decreased range of motion and tenderness. MRI recommended by orthopedics. Lidocaine  patches effective. - Await MRI approval and scheduling. - Consider lumbar injection based on MRI results. - Continue lidocaine  patches.

## 2024-03-09 NOTE — Assessment & Plan Note (Signed)
 Well controlled - Continue taking amlodipine  2.5 mg once daily BP Readings from Last 3 Encounters:  03/09/24 108/68  02/24/24 134/82  01/26/24 136/70    Refill sent

## 2024-03-13 ENCOUNTER — Telehealth: Payer: Self-pay | Admitting: Physician Assistant

## 2024-03-13 NOTE — Telephone Encounter (Signed)
 [employer] Forms

## 2024-04-15 ENCOUNTER — Other Ambulatory Visit: Payer: Self-pay | Admitting: Physician Assistant

## 2024-04-15 DIAGNOSIS — F331 Major depressive disorder, recurrent, moderate: Secondary | ICD-10-CM

## 2024-04-15 DIAGNOSIS — E559 Vitamin D deficiency, unspecified: Secondary | ICD-10-CM

## 2024-04-21 ENCOUNTER — Other Ambulatory Visit: Payer: Self-pay | Admitting: Physician Assistant

## 2024-04-21 DIAGNOSIS — R9439 Abnormal result of other cardiovascular function study: Secondary | ICD-10-CM

## 2024-04-25 ENCOUNTER — Other Ambulatory Visit: Payer: Self-pay | Admitting: Physician Assistant

## 2024-04-25 DIAGNOSIS — E039 Hypothyroidism, unspecified: Secondary | ICD-10-CM

## 2024-05-16 ENCOUNTER — Encounter

## 2024-05-16 ENCOUNTER — Other Ambulatory Visit

## 2024-05-23 ENCOUNTER — Telehealth: Payer: Self-pay

## 2024-05-23 ENCOUNTER — Telehealth: Payer: Self-pay | Admitting: Family Medicine

## 2024-05-23 NOTE — Telephone Encounter (Signed)
 Called patient to schedule appointment to have paperwork filled out to return to work. Left message for patient to call back.

## 2024-05-23 NOTE — Telephone Encounter (Signed)
 Copied from CRM [id number]. Topic: General - Other >> May 23, 2024 12:21 PM Kristen Vazquez wrote: Reason for CRM: patient has returned to work, no longer needs paperwork. Returned to work 05/18/24.

## 2024-05-30 ENCOUNTER — Ambulatory Visit

## 2024-05-30 ENCOUNTER — Ambulatory Visit
Admission: RE | Admit: 2024-05-30 | Discharge: 2024-05-30 | Disposition: A | Source: Ambulatory Visit | Attending: Physician Assistant | Admitting: Physician Assistant

## 2024-05-30 DIAGNOSIS — N641 Fat necrosis of breast: Secondary | ICD-10-CM

## 2024-06-02 ENCOUNTER — Ambulatory Visit

## 2024-06-02 VITALS — BP 136/70 | HR 88 | Ht 65.6 in | Wt 299.4 lb

## 2024-06-02 DIAGNOSIS — R072 Precordial pain: Secondary | ICD-10-CM

## 2024-06-02 DIAGNOSIS — I25118 Atherosclerotic heart disease of native coronary artery with other forms of angina pectoris: Secondary | ICD-10-CM | POA: Diagnosis not present

## 2024-06-02 DIAGNOSIS — E782 Mixed hyperlipidemia: Secondary | ICD-10-CM | POA: Diagnosis not present

## 2024-06-02 DIAGNOSIS — I10 Essential (primary) hypertension: Secondary | ICD-10-CM | POA: Diagnosis not present

## 2024-06-02 MED ORDER — METOPROLOL TARTRATE 100 MG PO TABS: 100.0000 mg | ORAL_TABLET | Freq: Once | ORAL | 0 refills | Status: DC

## 2024-06-02 NOTE — Progress Notes (Signed)
 Cardiology Consultation:    Date:  06/02/2024   ID:  Kristen Vazquez, DOB September 28, 1965, MRN [id number]  PCP:  Nicholaus Credit, PA-C  Cardiologist:  Alean SAUNDERS Miroslav Gin, MD   Referring MD: Nicholaus Credit, PA-C   No chief complaint on file.    ASSESSMENT AND PLAN:   Kristen Vazquez 58 year old woman with history of history of CAD and PCI of mid LAD 11/11/2023, COPD, hyperlipidemia, hypothyroidism, obesity, chronic back pain, depression/anxiety.  Here for routine follow-up. Problem List Items Addressed This Visit     Mixed hyperlipidemia   Last lipid panel from 12/21/2023 well-controlled LDL 63, HDL 46, total cholesterol 867 and triglycerides 128. Continue rosuvastatin  20 mg once daily.        CAD (coronary artery disease);abnml stress MPI 04/2023; abnml CT cors 09/2023 mid LAD and RCA; s/p cath 11/11/2023 PCI of mid LAD - Primary   Has been doing well. Improved functional capacity.  Continue dual antiplatelet therapy aspirin  plus Plavix  for 12 months post PCI following that we will plan to continue Plavix  75 mg once daily indefinitely.  Continue amlodipine  2.5 mg once daily Metoprolol  succinate 25 mg once daily Has sublingual nitroglycerin  to use as needed.       Hypertension   Well-controlled. Continue amlodipine  and metoprolol .       Morbid obesity (HCC)   BMI 48.9. Has gained weight over the last 4 months. Now back at work. Recommended she strongly monitor her calorie intake and have a diet plan for 2 to 3 days ahead of time so she does not do any impulsive eating. Advised to cut down on caffeinated soda intake.       Return to clinic tentatively in 6 months.   History of Present Illness:    Kristen Vazquez is a 58 y.o. female who is being seen today for follow-up visit. PCP is Nicholaus Credit, PA-C. Last visit with me in the office was 11/18/2023.  Has history of CAD, COPD, hyperlipidemia, hypothyroidism, obesity, chronic back pain, depression/anxiety. Lexiscan stress  test September 2024 abnormal. Cardiac CT coronary imaging 10/12/2023 CAD RADS 3 study with abnormal CT FFR of mid to distal LAD segments. Cardiac cath 11/11/2023 underwent PCI of mid LAD. Echocardiogram 11/18/2023 noted normal biventricular function LVEF 60 to 65%, no significant wall motion abnormality or valve dysfunction.  Mentions overall she is been doing well.  Was out of work for few months and gained weight. Now back to work for the last 2 to 3 weeks and has been able to gradually increase her activity. Continue to participate in physical therapy and [facility] 2 times a week.  No symptoms at rest and with day-to-day activities. Occasionally she feels chest pressure and shortness of breath with moderate exertion and quickly relieved with rest. Has not had to take any sublingual nitroglycerin .  Blood pressures are well-controlled. Tolerating her medications well without any side effects.  Last lipid panel 12/21/2023 LDL 63, HDL 46, total cholesterol 867 and triglycerides 128.  Past Medical History:  Diagnosis Date   Abnormal blood chemistry 01/02/2020   Acquired hypothyroidism 12/14/2018   Acute bilateral low back pain with right-sided sciatica 01/17/2024   Acute laryngopharyngitis 01/02/2020   Acute pain of right shoulder due to trauma 10/23/2020   Acute sinusitis 08/19/2021   Anterolisthesis of lumbar spine 02/24/2024   Anxiety 04/09/2020   Asthma    CAD (coronary artery disease);abnml stress MPI 04/2023; abnml CT cors 09/2023 mid LAD and RCA; s/p cath 11/11/2023 PCI of mid LAD  11/04/2023   Symptoms of dyspnea on exertion suggestive of angina and progressive over the past 2 to 3 months.  Prior abnormal stress test with nuclear imaging from September 2024  Abnormal cardiac CT imaging on 10-12-2023 with CAD RADS 3 study moderate 50 to 69% lesion of LAD and RCA with CT FFR abnormal for possible hemodynamically significant lesions in mid to distal LAD.  Calcium  score 526 and total plaque     Clonus 03/09/2024   Controlled type 2 diabetes mellitus without complication, without long-term current use of insulin (HCC) 03/30/2023   COPD (chronic obstructive pulmonary disease) (HCC)    Depression    Dyspnea on exertion 03/30/2023   Encounter for screening mammogram for breast cancer 09/26/2019   Gastroesophageal reflux disease without esophagitis 12/14/2018   GERD (gastroesophageal reflux disease)    Hypertension 11/04/2023   Kidney stones    Major depressive disorder, recurrent episode, moderate (HCC) 08/23/2018   Menorrhagia with irregular cycle 04/01/2021   Mild intermittent asthma 09/26/2019   Mixed hyperlipidemia 09/26/2019   Morbid obesity (HCC) 02/24/2024   Pain of right upper extremity 10/23/2020   Pelvic organ prolapse quantification stage 1 cystocele 04/01/2021   Pulmonary hypertension, primary (HCC) 03/09/2024   Spondylolysis of lumbar region 01/26/2024   Spondylolysis, lumbar region 02/24/2024   Status post hysteroscopy 05/21/2021    Past Surgical History:  Procedure Laterality Date   CORONARY PRESSURE/FFR STUDY N/A 11/11/2023   Procedure: CORONARY PRESSURE/FFR STUDY;  Surgeon: Elmira Newman PARAS, MD;  Location: [facility];  Service: Cardiovascular;  Laterality: N/A;  LAD   CORONARY STENT INTERVENTION N/A 11/11/2023   Procedure: CORONARY STENT INTERVENTION;  Surgeon: Elmira Newman PARAS, MD;  Location: [facility];  Service: Cardiovascular;  Laterality: N/A;   HERNIA REPAIR  2014   LEFT HEART CATH AND CORONARY ANGIOGRAPHY N/A 11/11/2023   Procedure: LEFT HEART CATH AND CORONARY ANGIOGRAPHY;  Surgeon: Elmira Newman PARAS, MD;  Location: [facility];  Service: Cardiovascular;  Laterality: N/A;    Current Medications: Current Meds  Medication Sig   Albuterol -Budesonide  (AIRSUPRA ) 90-80 MCG/ACT AERO Inhale 2 puffs into the lungs 4 (four) times daily as needed.   amLODipine  (NORVASC ) 2.5 MG tablet Take 1 tablet (2.5 mg total) by mouth daily.    aspirin  EC 81 MG tablet Take 1 tablet (81 mg total) by mouth daily. Swallow whole.   BREZTRI  AEROSPHERE 160-9-4.8 MCG/ACT AERO inhaler INHALE 2 PUFFS IN THE MORNING AND AT BEDTIME   clopidogrel  (PLAVIX ) 75 MG tablet Take 1 tablet (75 mg total) by mouth daily.   diazepam (VALIUM) 5 MG tablet Take 5 mg by mouth 2 (two) times daily as needed for anxiety. To be taken prior to MRI procedure   escitalopram  (LEXAPRO ) 20 MG tablet Take 1 tablet by mouth once daily   fluticasone  (FLONASE ) 50 MCG/ACT nasal spray Place 2 sprays into both nostrils daily.   ibuprofen  (ADVIL ) 800 MG tablet Take 1 tablet (800 mg total) by mouth every 8 (eight) hours as needed.   levothyroxine  (SYNTHROID ) 125 MCG tablet Take 1 tablet by mouth once daily   lidocaine  (LIDODERM ) 5 % Place 1 patch onto the skin daily. Remove & Discard patch within 12 hours or as directed by MD   LORazepam  (ATIVAN ) 0.5 MG tablet Take 1 tablet (0.5 mg total) by mouth daily as needed for anxiety.   metoprolol  succinate (TOPROL -XL) 25 MG 24 hr tablet Take 1 tablet by mouth once daily   nitroGLYCERIN  (NITROSTAT ) 0.4 MG  SL tablet Place 1 tablet (0.4 mg total) under the tongue every 5 (five) minutes as needed for chest pain.   pantoprazole  (PROTONIX ) 40 MG tablet TAKE 1 TABLET BY MOUTH ONCE DAILY   rosuvastatin  (CRESTOR ) 20 MG tablet Take 1 tablet (20 mg total) by mouth daily.   Vitamin D , Ergocalciferol , (DRISDOL ) 1.25 MG (50000 UNIT) CAPS capsule Take 1 capsule by mouth once a week     Allergies:   Codeine, Sulfa antibiotics, and Coconut (cocos nucifera)   Social History   Socioeconomic History   Marital status: Widowed    Spouse name: Not on file   Number of children: 1   Years of education: Not on file   Highest education level: 12th grade  Occupational History   Occupation: [employer]  Tobacco Use   Smoking status: Former    Current packs/day: 0.00    Average packs/day: 1 pack/day for 30.0 years (30.0 ttl pk-yrs)    Types:  Cigarettes    Start date: 62    Quit date: 2012    Years since quitting: 13.8   Smokeless tobacco: Never  Vaping Use   Vaping status: Never Used  Substance and Sexual Activity   Alcohol use: Yes    Alcohol/week: 2.0 standard drinks of alcohol    Types: 2 Glasses of wine per week    Comment: SOCIAL   Drug use: Never   Sexual activity: Not Currently  Other Topics Concern   Not on file  Social History Narrative   Not on file   Social Drivers of Health   Financial Resource Strain: Patient Declined (01/26/2024)   Overall Financial Resource Strain (CARDIA)    Difficulty of Paying Living Expenses: Patient declined  Food Insecurity: No Food Insecurity (01/26/2024)   Hunger Vital Sign    Worried About Running Out of Food in the Last Year: Never true    Ran Out of Food in the Last Year: Never true  Transportation Needs: No Transportation Needs (01/26/2024)   PRAPARE - Administrator, Civil Service (Medical): No    Lack of Transportation (Non-Medical): No  Physical Activity: Insufficiently Active (01/26/2024)   Exercise Vital Sign    Days of Exercise per Week: 2 days    Minutes of Exercise per Session: 10 min  Stress: Patient Declined (01/26/2024)   [employer] of Occupational Health - Occupational Stress Questionnaire    Feeling of Stress : Patient declined  Social Connections: Moderately Integrated (01/26/2024)   Social Connection and Isolation Panel    Frequency of Communication with Friends and Family: More than three times a week    Frequency of Social Gatherings with Friends and Family: Three times a week    Attends Religious Services: 1 to 4 times per year    Active Member of Clubs or Organizations: Yes    Attends Banker Meetings: More than 4 times per year    Marital Status: Widowed     Family History: The patient's family history includes Alcohol abuse in her brother; Breast cancer in her mother; Heart failure in her brother; Leukemia in her  father. ROS:   Please see the history of present illness.    All 14 point review of systems negative except as described per history of present illness.  EKGs/Labs/Other Studies Reviewed:    The following studies were reviewed today:   EKG:       Recent Labs: 12/21/2023: ALT 22; BUN 11; Creatinine, Ser 0.57; Hemoglobin 14.7; Platelets  228; Potassium 4.3; Sodium 142; TSH 0.468  Recent Lipid Panel    Component Value Date/Time   CHOL 132 12/21/2023 1145   TRIG 128 12/21/2023 1145   HDL 46 12/21/2023 1145   CHOLHDL 2.9 12/21/2023 1145   LDLCALC 63 12/21/2023 1145    Physical Exam:    VS:  BP 136/70   Pulse 88   Ht 5' 5.6 (1.666 m)   Wt 299 lb 6.4 oz (135.8 kg)   LMP  (LMP Unknown)   SpO2 95%   BMI 48.92 kg/m     Wt Readings from Last 3 Encounters:  06/02/24 299 lb 6.4 oz (135.8 kg)  03/09/24 290 lb (131.5 kg)  02/24/24 288 lb (130.6 kg)     GENERAL:  Well nourished, well developed in no acute distress NECK: No JVD; No carotid bruits CARDIAC: RRR, S1 and S2 present, no murmurs, no rubs, no gallops CHEST:  Clear to auscultation without rales, wheezing or rhonchi  Extremities: No pitting pedal edema. Pulses bilaterally symmetric with radial 2+ and dorsalis pedis 2+ NEUROLOGIC:  Alert and oriented x 3  Medication Adjustments/Labs and Tests Ordered: Current medicines are reviewed at length with the patient today.  Concerns regarding medicines are outlined above.  No orders of the defined types were placed in this encounter.  No orders of the defined types were placed in this encounter.   Signed, Alean jess Kobus, MD, MPH, [facility]. 06/02/2024 4:42 PM    [facility]

## 2024-06-02 NOTE — Assessment & Plan Note (Signed)
 Last lipid panel from 12/21/2023 well-controlled LDL 63, HDL 46, total cholesterol 867 and triglycerides 128. Continue rosuvastatin  20 mg once daily.

## 2024-06-02 NOTE — Assessment & Plan Note (Signed)
 Has been doing well. Improved functional capacity.  Continue dual antiplatelet therapy aspirin  plus Plavix  for 12 months post PCI following that we will plan to continue Plavix  75 mg once daily indefinitely.  Continue amlodipine  2.5 mg once daily Metoprolol  succinate 25 mg once daily Has sublingual nitroglycerin  to use as needed.

## 2024-06-02 NOTE — Assessment & Plan Note (Signed)
 BMI 48.9. Has gained weight over the last 4 months. Now back at work. Recommended she strongly monitor her calorie intake and have a diet plan for 2 to 3 days ahead of time so she does not do any impulsive eating. Advised to cut down on caffeinated soda intake.

## 2024-06-02 NOTE — Assessment & Plan Note (Signed)
Well controlled. Continue amlodipine and metoprolol.

## 2024-06-02 NOTE — Patient Instructions (Signed)

## 2024-06-27 ENCOUNTER — Encounter: Payer: Self-pay | Admitting: Physician Assistant

## 2024-06-27 ENCOUNTER — Ambulatory Visit (INDEPENDENT_AMBULATORY_CARE_PROVIDER_SITE_OTHER): Admitting: Physician Assistant

## 2024-06-27 ENCOUNTER — Other Ambulatory Visit: Payer: Self-pay | Admitting: Physician Assistant

## 2024-06-27 VITALS — BP 140/88 | HR 78 | Temp 98.0°F | Ht 65.6 in | Wt 293.6 lb

## 2024-06-27 DIAGNOSIS — M4306 Spondylolysis, lumbar region: Secondary | ICD-10-CM

## 2024-06-27 DIAGNOSIS — E559 Vitamin D deficiency, unspecified: Secondary | ICD-10-CM

## 2024-06-27 DIAGNOSIS — Z1211 Encounter for screening for malignant neoplasm of colon: Secondary | ICD-10-CM

## 2024-06-27 DIAGNOSIS — E119 Type 2 diabetes mellitus without complications: Secondary | ICD-10-CM

## 2024-06-27 DIAGNOSIS — F331 Major depressive disorder, recurrent, moderate: Secondary | ICD-10-CM | POA: Diagnosis not present

## 2024-06-27 DIAGNOSIS — E782 Mixed hyperlipidemia: Secondary | ICD-10-CM

## 2024-06-27 DIAGNOSIS — J438 Other emphysema: Secondary | ICD-10-CM | POA: Diagnosis not present

## 2024-06-27 DIAGNOSIS — I2510 Atherosclerotic heart disease of native coronary artery without angina pectoris: Secondary | ICD-10-CM

## 2024-06-27 DIAGNOSIS — K219 Gastro-esophageal reflux disease without esophagitis: Secondary | ICD-10-CM

## 2024-06-27 DIAGNOSIS — E039 Hypothyroidism, unspecified: Secondary | ICD-10-CM

## 2024-06-27 DIAGNOSIS — G43809 Other migraine, not intractable, without status migrainosus: Secondary | ICD-10-CM

## 2024-06-27 MED ORDER — BREZTRI AEROSPHERE 160-9-4.8 MCG/ACT IN AERO: 2.0000 | INHALATION_SPRAY | Freq: Two times a day (BID) | RESPIRATORY_TRACT | 5 refills | Status: AC

## 2024-06-27 MED ORDER — UBRELVY 50 MG PO TABS: 50.0000 mg | ORAL_TABLET | Freq: Every day | ORAL | Status: DC | PRN

## 2024-06-27 MED ORDER — BUPROPION HCL ER (XL) 150 MG PO TB24: 150.0000 mg | ORAL_TABLET | Freq: Every day | ORAL | 2 refills | Status: AC

## 2024-06-27 NOTE — Progress Notes (Signed)
 Subjective:  Patient ID: Kristen Vazquez, female    DOB: [birth year]  Age: 58 y.o. MRN: [id number]  Chief Complaint  Patient presents with   Medical Management of Chronic Issues    Hyperlipidemia    Mixed hyperlipidemia  Pt presents with hyperlipidemia. Compliance with treatment has beengood The patient is compliant with medications, maintains a low cholesterol diet , follows up as directed , and maintains an exercise regimen .   Pt with CAD s/p stent placement She is now on plavix  75mg  , crestor  20mg   toprol  XL 25mg  qd and norvasc  2.5mg  qd She is also on ASA 81mg  qd - has an upcoming follow up appt with cardiology  Pt with  hypothyroidism - currently on synthroid  125mcg qd - due for labwork Voices no problems or concerns  Pt with  GERD - stable on protonix   Pt with anxiety and depression - she is  on lexapro  20mg  and uses ativan  only as needed - states she is having breakthrough symptoms of anxiety and worry after being out of work for [employer] with her back  Pt with diabetes - currently trying to control with diet - - she has not been started on medication at this time - has actually gained weight since last visit She is trying to make small changes in her diet  Pt with vit D def - taking weekly supplement and due for labwork  Pt with asthma/COPD - symptoms are stable on airsupra  and breztri   Pt is currently being followed by [facility] for chronic low back pain - she states she is in Physical Therapy at this time and doing much better - she had been on Lidoderm  patches but has stopped those -- she had been taking ibuprofen  800mg  as needed but she is advised to stop that medication and only use tylenol  as needed (pt with cardiac stent and on ASA 81 and plavix  therapy)  Pt would like to reorder cologuard - Current Outpatient Medications on File Prior to Visit  Medication Sig Dispense Refill   Albuterol -Budesonide  (AIRSUPRA ) 90-80 MCG/ACT AERO Inhale 2 puffs into the lungs 4  (four) times daily as needed. 10.7 g 5   amLODipine  (NORVASC ) 2.5 MG tablet Take 1 tablet (2.5 mg total) by mouth daily. 180 tablet 3   aspirin  EC 81 MG tablet Take 1 tablet (81 mg total) by mouth daily. Swallow whole. 90 tablet 3   clopidogrel  (PLAVIX ) 75 MG tablet Take 1 tablet (75 mg total) by mouth daily. 90 tablet 3   escitalopram  (LEXAPRO ) 20 MG tablet Take 1 tablet by mouth once daily 90 tablet 0   fluticasone  (FLONASE ) 50 MCG/ACT nasal spray Place 2 sprays into both nostrils daily. 16 g 6   levothyroxine  (SYNTHROID ) 125 MCG tablet Take 1 tablet by mouth once daily 90 tablet 0   LORazepam  (ATIVAN ) 0.5 MG tablet Take 1 tablet (0.5 mg total) by mouth daily as needed for anxiety. 30 tablet 0   metoprolol  succinate (TOPROL -XL) 25 MG 24 hr tablet Take 1 tablet by mouth once daily 90 tablet 0   nitroGLYCERIN  (NITROSTAT ) 0.4 MG SL tablet Place 1 tablet (0.4 mg total) under the tongue every 5 (five) minutes as needed for chest pain. 25 tablet 1   pantoprazole  (PROTONIX ) 40 MG tablet TAKE 1 TABLET BY MOUTH ONCE DAILY 90 tablet 1   rosuvastatin  (CRESTOR ) 20 MG tablet Take 1 tablet (20 mg total) by mouth daily. 90 tablet 1   Vitamin D , Ergocalciferol , (DRISDOL ) 1.25 MG (50000  UNIT) CAPS capsule Take 1 capsule by mouth once a week 12 capsule 0   No current facility-administered medications on file prior to visit.   Past Medical History:  Diagnosis Date   Abnormal blood chemistry 01/02/2020   Acquired hypothyroidism 12/14/2018   Acute bilateral low back pain with right-sided sciatica 01/17/2024   Acute laryngopharyngitis 01/02/2020   Acute pain of right shoulder due to trauma 10/23/2020   Acute sinusitis 08/19/2021   Anterolisthesis of lumbar spine 02/24/2024   Anxiety 04/09/2020   Asthma    CAD (coronary artery disease);abnml stress MPI 04/2023; abnml CT cors 09/2023 mid LAD and RCA; s/p cath 11/11/2023 PCI of mid LAD 11/04/2023   Symptoms of dyspnea on exertion suggestive of angina and  progressive over the past 2 to 3 months.  Prior abnormal stress test with nuclear imaging from September 2024  Abnormal cardiac CT imaging on 10-12-2023 with CAD RADS 3 study moderate 50 to 69% lesion of LAD and RCA with CT FFR abnormal for possible hemodynamically significant lesions in mid to distal LAD.  Calcium  score 526 and total plaque    Clonus 03/09/2024   Controlled type 2 diabetes mellitus without complication, without long-term current use of insulin (HCC) 03/30/2023   COPD (chronic obstructive pulmonary disease) (HCC)    Depression    Dyspnea on exertion 03/30/2023   Encounter for screening mammogram for breast cancer 09/26/2019   Gastroesophageal reflux disease without esophagitis 12/14/2018   GERD (gastroesophageal reflux disease)    Hypertension 11/04/2023   Kidney stones    Major depressive disorder, recurrent episode, moderate (HCC) 08/23/2018   Menorrhagia with irregular cycle 04/01/2021   Mild intermittent asthma 09/26/2019   Mixed hyperlipidemia 09/26/2019   Morbid obesity (HCC) 02/24/2024   Pain of right upper extremity 10/23/2020   Pelvic organ prolapse quantification stage 1 cystocele 04/01/2021   Pulmonary hypertension, primary (HCC) 03/09/2024   Spondylolysis of lumbar region 01/26/2024   Spondylolysis, lumbar region 02/24/2024   Status post hysteroscopy 05/21/2021   Past Surgical History:  Procedure Laterality Date   CORONARY PRESSURE/FFR STUDY N/A 11/11/2023   Procedure: CORONARY PRESSURE/FFR STUDY;  Surgeon: Elmira Newman PARAS, MD;  Location: [facility];  Service: Cardiovascular;  Laterality: N/A;  LAD   CORONARY STENT INTERVENTION N/A 11/11/2023   Procedure: CORONARY STENT INTERVENTION;  Surgeon: Elmira Newman PARAS, MD;  Location: [facility];  Service: Cardiovascular;  Laterality: N/A;   HERNIA REPAIR  2014   LEFT HEART CATH AND CORONARY ANGIOGRAPHY N/A 11/11/2023   Procedure: LEFT HEART CATH AND CORONARY ANGIOGRAPHY;  Surgeon: Elmira Newman PARAS, MD;  Location: [facility];  Service: Cardiovascular;  Laterality: N/A;    Family History  Problem Relation Age of Onset   Breast cancer Mother    Leukemia Father    Heart failure Brother    Alcohol abuse Brother    Social History   Socioeconomic History   Marital status: Widowed    Spouse name: Not on file   Number of children: 1   Years of education: Not on file   Highest education level: 12th grade  Occupational History   Occupation: [employer]  Tobacco Use   Smoking status: Former    Current packs/day: 0.00    Average packs/day: 1 pack/day for 30.0 years (30.0 ttl pk-yrs)    Types: Cigarettes    Start date: 62    Quit date: 2012    Years since quitting: 13.8   Smokeless tobacco:  Never  Vaping Use   Vaping status: Never Used  Substance and Sexual Activity   Alcohol use: Yes    Alcohol/week: 2.0 standard drinks of alcohol    Types: 2 Glasses of wine per week    Comment: SOCIAL   Drug use: Never   Sexual activity: Not Currently  Other Topics Concern   Not on file  Social History Narrative   Not on file   Social Drivers of Health   Financial Resource Strain: Patient Declined (01/26/2024)   Overall Financial Resource Strain (CARDIA)    Difficulty of Paying Living Expenses: Patient declined  Food Insecurity: No Food Insecurity (01/26/2024)   Hunger Vital Sign    Worried About Running Out of Food in the Last Year: Never true    Ran Out of Food in the Last Year: Never true  Transportation Needs: No Transportation Needs (01/26/2024)   PRAPARE - Administrator, Civil Service (Medical): No    Lack of Transportation (Non-Medical): No  Physical Activity: Insufficiently Active (01/26/2024)   Exercise Vital Sign    Days of Exercise per Week: 2 days    Minutes of Exercise per Session: 10 min  Stress: Patient Declined (01/26/2024)   [employer] of Occupational Health - Occupational Stress Questionnaire    Feeling of Stress :  Patient declined  Social Connections: Moderately Integrated (01/26/2024)   Social Connection and Isolation Panel    Frequency of Communication with Friends and Family: More than three times a week    Frequency of Social Gatherings with Friends and Family: Three times a week    Attends Religious Services: 1 to 4 times per year    Active Member of Clubs or Organizations: Yes    Attends Banker Meetings: More than 4 times per year    Marital Status: Widowed    CONSTITUTIONAL: Negative for chills, fatigue, fever, unintentional weight gain and unintentional weight loss.  E/N/T: Negative for ear pain, nasal congestion and sore throat.  CARDIOVASCULAR: Negative for chest pain, dizziness, palpitations and pedal edema.  RESPIRATORY: Negative for recent cough and dyspnea.  GASTROINTESTINAL: Negative for abdominal pain, acid reflux symptoms, constipation, diarrhea, nausea and vomiting.  MSK: Negative for arthralgias and myalgias.  INTEGUMENTARY: Negative for rash.  NEUROLOGICAL: see HPI PSYCHIATRIC: see HPI      Objective:  PHYSICAL EXAM:   VS: BP (!) 140/88   Pulse 78   Temp 98 F (36.7 C)   Ht 5' 5.6 (1.666 m)   Wt 293 lb 9.6 oz (133.2 kg)   LMP  (LMP Unknown)   SpO2 98%   BMI 47.97 kg/m   GEN: Well nourished, well developed, in no acute distress  Cardiac: RRR; no murmurs, rubs, or gallops,no edema -  Respiratory:  normal respiratory rate and pattern with no distress - normal breath sounds with no rales, rhonchi, wheezes or rubs GI: normal bowel sounds, no masses or tenderness MS: no deformity or atrophy  Skin: warm and dry, no rash  Neuro:  Alert and Oriented x 3, - CN II-Xii grossly intact Psych: euthymic mood, appropriate affect and demeanor   Lab Results  Component Value Date   WBC 8.1 12/21/2023   HGB 14.7 12/21/2023   HCT 46.2 12/21/2023   PLT 228 12/21/2023   GLUCOSE 110 (H) 12/21/2023   CHOL 132 12/21/2023   TRIG 128 12/21/2023   HDL 46 12/21/2023    LDLCALC 63 12/21/2023   ALT 22 12/21/2023   AST 17 12/21/2023  NA 142 12/21/2023   K 4.3 12/21/2023   CL 105 12/21/2023   CREATININE 0.57 12/21/2023   BUN 11 12/21/2023   CO2 24 12/21/2023   TSH 0.468 12/21/2023   HGBA1C 6.9 (H) 12/21/2023      Assessment & Plan:   Problem List Items Addressed This Visit       Digestive   Gastroesophageal reflux disease without esophagitis Rx for protonix  40mg  qd     Endocrine   Acquired hypothyroidism - Primary Continue meds TSH pending     Other   Mixed hyperlipidemia   Relevant Orders   CBC with Differential/Platelet   Comprehensive metabolic panel   Lipid panel Watch diet Continue crestor  20mg  qd  Coronary artery disease (with stent placement) Continue meds as directed Follow up with cardiology as directed  Vit D def Continue supplement Labwork pending  Emphysema (HCC) Continue breztri  Continue Airsupra     Major depression with recurrent episode Continue lexapro  Add wellbutrin 150mg  qd  Other migraine Sample given of Ubrelvy to take as directed  Spondylolysis Continue tylenol  and physical therapy Follow up with ortho as scheduled    Type 2 diabetes mellitus with hyperglycemia, without long-term current use of insulin (HCC)       Relevant Orders   CBC with Differential/Platelet   Comprehensive metabolic panel   Lipid panel   Hemoglobin A1c Low sugar/low carb diet Efforts at weight loss  Colon cancer screening Complete cologuard      .  Meds ordered this encounter  Medications   budesonide -glycopyrrolate-formoterol  (BREZTRI  AEROSPHERE) 160-9-4.8 MCG/ACT AERO inhaler    Sig: Inhale 2 puffs into the lungs 2 (two) times daily.    Dispense:  11 g    Refill:  5    Supervising Provider:   COX, KIRSTEN [[id number]]   Ubrogepant (UBRELVY) 50 MG TABS    Sig: Take 1 tablet (50 mg total) by mouth daily as needed (migraine).    Supervising Provider:   COX, KIRSTEN [[id number]]   buPROPion (WELLBUTRIN XL) 150 MG  24 hr tablet    Sig: Take 1 tablet (150 mg total) by mouth daily.    Dispense:  30 tablet    Refill:  2    Supervising Provider:   COX, KIRSTEN [phone]     Orders Placed This Encounter  Procedures   CBC with Differential/Platelet   Comprehensive metabolic panel with GFR   TSH   Lipid panel   VITAMIN D  25 Hydroxy (Vit-D Deficiency, Fractures)   Hemoglobin A1c   Cologuard     Follow-up: Return in about 4 weeks (around 07/25/2024) for follow-up.  An After Visit Summary was printed and given to the patient.  Kristen Vazquez [facility] [phone]

## 2024-06-28 ENCOUNTER — Ambulatory Visit: Payer: Self-pay | Admitting: Family Medicine

## 2024-06-28 LAB — CBC WITH DIFFERENTIAL/PLATELET
Basophils Absolute: 0 x10E3/uL (ref 0.0–0.2)
Basos: 0 %
EOS (ABSOLUTE): 0.3 x10E3/uL (ref 0.0–0.4)
Eos: 3 %
Hematocrit: 45.1 % (ref 34.0–46.6)
Hemoglobin: 13.8 g/dL (ref 11.1–15.9)
Immature Grans (Abs): 0 x10E3/uL (ref 0.0–0.1)
Immature Granulocytes: 0 %
Lymphocytes Absolute: 2 x10E3/uL (ref 0.7–3.1)
Lymphs: 25 %
MCH: 27.1 pg (ref 26.6–33.0)
MCHC: 30.6 g/dL — ABNORMAL LOW (ref 31.5–35.7)
MCV: 88 fL (ref 79–97)
Monocytes Absolute: 0.5 x10E3/uL (ref 0.1–0.9)
Monocytes: 7 %
Neutrophils Absolute: 5 x10E3/uL (ref 1.4–7.0)
Neutrophils: 65 %
Platelets: 245 x10E3/uL (ref 150–450)
RBC: 5.1 x10E6/uL (ref 3.77–5.28)
RDW: 13.3 % (ref 11.7–15.4)
WBC: 7.8 x10E3/uL (ref 3.4–10.8)

## 2024-06-28 LAB — COMPREHENSIVE METABOLIC PANEL WITH GFR
ALT: 29 IU/L (ref 0–32)
AST: 33 IU/L (ref 0–40)
Albumin: 4.4 g/dL (ref 3.8–4.9)
Alkaline Phosphatase: 114 IU/L (ref 49–135)
BUN/Creatinine Ratio: 18 (ref 9–23)
BUN: 11 mg/dL (ref 6–24)
Bilirubin Total: 0.3 mg/dL (ref 0.0–1.2)
CO2: 25 mmol/L (ref 20–29)
Calcium: 9.5 mg/dL (ref 8.7–10.2)
Chloride: 100 mmol/L (ref 96–106)
Creatinine, Ser: 0.62 mg/dL (ref 0.57–1.00)
Globulin, Total: 2.6 g/dL (ref 1.5–4.5)
Glucose: 128 mg/dL — ABNORMAL HIGH (ref 70–99)
Potassium: 5.1 mmol/L (ref 3.5–5.2)
Sodium: 140 mmol/L (ref 134–144)
Total Protein: 7 g/dL (ref 6.0–8.5)
eGFR: 104 mL/min/1.73 (ref >59)

## 2024-06-28 LAB — LIPID PANEL
Chol/HDL Ratio: 3 ratio (ref 0.0–4.4)
Cholesterol, Total: 133 mg/dL (ref 100–199)
HDL: 44 mg/dL (ref >39)
LDL Chol Calc (NIH): 64 mg/dL (ref 0–99)
Triglycerides: 141 mg/dL (ref 0–149)
VLDL Cholesterol Cal: 25 mg/dL (ref 5–40)

## 2024-06-28 LAB — TSH: TSH: 1.15 u[IU]/mL (ref 0.450–4.500)

## 2024-06-28 LAB — VITAMIN D 25 HYDROXY (VIT D DEFICIENCY, FRACTURES): Vit D, 25-Hydroxy: 41.3 ng/mL (ref 30.0–100.0)

## 2024-06-28 LAB — HEMOGLOBIN A1C
Est. average glucose Bld gHb Est-mCnc: 160 mg/dL
Hgb A1c MFr Bld: 7.2 % — ABNORMAL HIGH (ref 4.8–5.6)

## 2024-06-30 ENCOUNTER — Encounter: Payer: Self-pay | Admitting: Physician Assistant

## 2024-06-30 ENCOUNTER — Other Ambulatory Visit: Payer: Self-pay | Admitting: Family Medicine

## 2024-06-30 DIAGNOSIS — G43809 Other migraine, not intractable, without status migrainosus: Secondary | ICD-10-CM

## 2024-06-30 MED ORDER — UBRELVY 50 MG PO TABS: 50.0000 mg | ORAL_TABLET | Freq: Every day | ORAL | 2 refills | Status: AC | PRN

## 2024-07-04 ENCOUNTER — Telehealth: Payer: Self-pay | Admitting: Physician Assistant

## 2024-07-04 NOTE — Telephone Encounter (Signed)
 [employer] Forms

## 2024-07-05 ENCOUNTER — Other Ambulatory Visit: Payer: Self-pay | Admitting: Physician Assistant

## 2024-07-05 DIAGNOSIS — K219 Gastro-esophageal reflux disease without esophagitis: Secondary | ICD-10-CM

## 2024-07-13 ENCOUNTER — Other Ambulatory Visit: Payer: Self-pay | Admitting: Physician Assistant

## 2024-07-13 DIAGNOSIS — F331 Major depressive disorder, recurrent, moderate: Secondary | ICD-10-CM

## 2024-07-16 ENCOUNTER — Other Ambulatory Visit: Payer: Self-pay | Admitting: Physician Assistant

## 2024-07-16 DIAGNOSIS — F419 Anxiety disorder, unspecified: Secondary | ICD-10-CM

## 2024-07-16 DIAGNOSIS — E782 Mixed hyperlipidemia: Secondary | ICD-10-CM

## 2024-07-19 ENCOUNTER — Encounter: Payer: Self-pay | Admitting: Physician Assistant

## 2024-07-19 ENCOUNTER — Ambulatory Visit: Admitting: Physician Assistant

## 2024-07-19 VITALS — BP 128/68 | HR 85 | Temp 97.6°F | Ht 65.6 in | Wt 286.0 lb

## 2024-07-19 DIAGNOSIS — E661 Drug-induced obesity: Secondary | ICD-10-CM

## 2024-07-19 DIAGNOSIS — I2510 Atherosclerotic heart disease of native coronary artery without angina pectoris: Secondary | ICD-10-CM

## 2024-07-19 DIAGNOSIS — E66813 Obesity, class 3: Secondary | ICD-10-CM

## 2024-07-19 DIAGNOSIS — F419 Anxiety disorder, unspecified: Secondary | ICD-10-CM | POA: Diagnosis not present

## 2024-07-19 DIAGNOSIS — Z6841 Body Mass Index (BMI) 40.0 and over, adult: Secondary | ICD-10-CM

## 2024-07-19 MED ORDER — WEGOVY 0.25 MG/0.5ML ~~LOC~~ SOAJ: 0.2500 mg | SUBCUTANEOUS | 0 refills | Status: DC

## 2024-07-19 MED ORDER — WEGOVY 0.25 MG/0.5ML ~~LOC~~ SOAJ: 0.2500 mg | SUBCUTANEOUS | 0 refills | Status: AC

## 2024-07-19 NOTE — Progress Notes (Signed)
 Subjective:  Patient ID: Kristen Vazquez, female    DOB: 10-May-1966  Age: 58 y.o. MRN: [id number]  Chief Complaint  Patient presents with   Depression follow up    HPI Pt in today for follow up of depression.  She is taking lexapro  20mg  qd and added in wellbutrin  150mg  at last visit.  States overall feels much better and anxiety much more controlled.  Able to sleep better .  States she is seeing therapist at work now also  Pt would like rx for wegovy  to help with weight loss.  She tries to watch diet and has lost a few pounds since last visit.  She does have CAD and GLP1 treatment would be helpful as well She is trying to start regular exercise activity     07/19/2024    1:25 PM 06/27/2024    8:14 AM 12/21/2023   11:06 AM 09/16/2023    9:18 AM 03/16/2023   10:54 AM  Depression screen PHQ 2/9  Decreased Interest 2 1 0 0 0  Down, Depressed, Hopeless 2 0 0 0 0  PHQ - 2 Score 4 1 0 0 0  Altered sleeping 3 1 0 0 1  Tired, decreased energy 2 1 0 0 1  Change in appetite 2 1 0 0 0  Feeling bad or failure about yourself  2 0 0 0 0  Trouble concentrating 1 1 0 0 0  Moving slowly or fidgety/restless 1 0 0 0 0  Suicidal thoughts 0 0 0 0 0  PHQ-9 Score 15 5 0  0  2   Difficult doing work/chores Very difficult Not difficult at all Not difficult at all Not difficult at all Not difficult at all     Data saved with a previous flowsheet row definition        11/10/2022    9:00 AM 03/16/2023   10:54 AM 09/16/2023    9:18 AM 12/21/2023   11:06 AM 06/27/2024    8:14 AM  Fall Risk  Falls in the past year? 0 0 0 0 1  Was there an injury with Fall? 0  0  0  0  1   Fall Risk Category Calculator 0 0 0 0 2  Patient at Risk for Falls Due to No Fall Risks No Fall Risks No Fall Risks No Fall Risks History of fall(s)  Fall risk Follow up Falls evaluation completed Falls evaluation completed Falls evaluation completed Falls evaluation completed Falls evaluation completed     Data saved with a previous  flowsheet row definition     ROS CONSTITUTIONAL: Negative for chills, fatigue, fever,   CARDIOVASCULAR: Negative for chest pain, dizziness, palpitations and pedal edema.  RESPIRATORY: Negative for recent cough and dyspnea.  GASTROINTESTINAL: Negative for abdominal pain, acid reflux symptoms, constipation, diarrhea, nausea and vomiting.   PSYCHIATRIC: Negative for sleep disturbance and to question depression screen.  Negative for depression, negative for anhedonia.    Current Outpatient Medications:    Albuterol -Budesonide  (AIRSUPRA ) 90-80 MCG/ACT AERO, Inhale 2 puffs into the lungs 4 (four) times daily as needed., Disp: 10.7 g, Rfl: 5   amLODipine  (NORVASC ) 2.5 MG tablet, Take 1 tablet (2.5 mg total) by mouth daily., Disp: 180 tablet, Rfl: 3   aspirin  EC 81 MG tablet, Take 1 tablet (81 mg total) by mouth daily. Swallow whole., Disp: 90 tablet, Rfl: 3   budesonide -glycopyrrolate-formoterol  (BREZTRI  AEROSPHERE) 160-9-4.8 MCG/ACT AERO inhaler, Inhale 2 puffs into the lungs 2 (two) times daily., Disp: 11  g, Rfl: 5   buPROPion  (WELLBUTRIN  XL) 150 MG 24 hr tablet, Take 1 tablet (150 mg total) by mouth daily., Disp: 30 tablet, Rfl: 2   clopidogrel  (PLAVIX ) 75 MG tablet, Take 1 tablet (75 mg total) by mouth daily., Disp: 90 tablet, Rfl: 3   escitalopram  (LEXAPRO ) 20 MG tablet, Take 1 tablet by mouth once daily, Disp: 90 tablet, Rfl: 0   fluticasone  (FLONASE ) 50 MCG/ACT nasal spray, Place 2 sprays into both nostrils daily., Disp: 16 g, Rfl: 6   levothyroxine  (SYNTHROID ) 125 MCG tablet, Take 1 tablet by mouth once daily, Disp: 90 tablet, Rfl: 0   LORazepam  (ATIVAN ) 0.5 MG tablet, TAKE 1 TABLET BY MOUTH ONCE DAILY AS NEEDED FOR ANXIETY, Disp: 30 tablet, Rfl: 0   metoprolol  succinate (TOPROL -XL) 25 MG 24 hr tablet, Take 1 tablet by mouth once daily, Disp: 90 tablet, Rfl: 0   nitroGLYCERIN  (NITROSTAT ) 0.4 MG SL tablet, Place 1 tablet (0.4 mg total) under the tongue every 5 (five) minutes as needed for  chest pain., Disp: 25 tablet, Rfl: 1   pantoprazole  (PROTONIX ) 40 MG tablet, Take 1 tablet by mouth once daily, Disp: 90 tablet, Rfl: 0   rosuvastatin  (CRESTOR ) 20 MG tablet, Take 1 tablet by mouth once daily, Disp: 90 tablet, Rfl: 0   semaglutide-weight management (WEGOVY) 0.25 MG/0.5ML SOAJ SQ injection, Inject 0.25 mg into the skin once a week., Disp: 2 mL, Rfl: 0   Ubrogepant  (UBRELVY ) 50 MG TABS, Take 1 tablet (50 mg total) by mouth daily as needed (migraine)., Disp: 16 tablet, Rfl: 2   Vitamin D , Ergocalciferol , (DRISDOL ) 1.25 MG (50000 UNIT) CAPS capsule, Take 1 capsule by mouth once a week, Disp: 12 capsule, Rfl: 0  Past Medical History:  Diagnosis Date   Abnormal blood chemistry 01/02/2020   Acquired hypothyroidism 12/14/2018   Acute bilateral low back pain with right-sided sciatica 01/17/2024   Acute laryngopharyngitis 01/02/2020   Acute pain of right shoulder due to trauma 10/23/2020   Acute sinusitis 08/19/2021   Anterolisthesis of lumbar spine 02/24/2024   Anxiety 04/09/2020   Asthma    CAD (coronary artery disease);abnml stress MPI 04/2023; abnml CT cors 09/2023 mid LAD and RCA; s/p cath 11/11/2023 PCI of mid LAD 11/04/2023   Symptoms of dyspnea on exertion suggestive of angina and progressive over the past 2 to 3 months.  Prior abnormal stress test with nuclear imaging from September 2024  Abnormal cardiac CT imaging on 10-12-2023 with CAD RADS 3 study moderate 50 to 69% lesion of LAD and RCA with CT FFR abnormal for possible hemodynamically significant lesions in mid to distal LAD.  Calcium  score 526 and total plaque    Clonus 03/09/2024   Controlled type 2 diabetes mellitus without complication, without long-term current use of insulin (HCC) 03/30/2023   COPD (chronic obstructive pulmonary disease) (HCC)    Depression    Dyspnea on exertion 03/30/2023   Encounter for screening mammogram for breast cancer 09/26/2019   Gastroesophageal reflux disease without esophagitis  12/14/2018   GERD (gastroesophageal reflux disease)    Hypertension 11/04/2023   Kidney stones    Major depressive disorder, recurrent episode, moderate (HCC) 08/23/2018   Menorrhagia with irregular cycle 04/01/2021   Mild intermittent asthma 09/26/2019   Mixed hyperlipidemia 09/26/2019   Morbid obesity (HCC) 02/24/2024   Pain of right upper extremity 10/23/2020   Pelvic organ prolapse quantification stage 1 cystocele 04/01/2021   Pulmonary hypertension, primary (HCC) 03/09/2024   Spondylolysis of lumbar region 01/26/2024  Spondylolysis, lumbar region 02/24/2024   Status post hysteroscopy 05/21/2021   Objective:  PHYSICAL EXAM:   BP 128/68 (BP Location: Right Arm, Patient Position: Sitting)   Pulse 85   Temp 97.6 F (36.4 C) (Temporal)   Ht 5' 5.6 (1.666 m)   Wt 286 lb (129.7 kg)   LMP  (LMP Unknown)   SpO2 98%   BMI 46.73 kg/m    GEN: Well nourished, well developed, in no acute distress   Cardiac: RRR; no murmurs Respiratory:  normal respiratory rate and pattern with no distress - normal breath sounds with no rales, rhonchi, wheezes or rubs  Skin: warm and dry, no rash   Psych: euthymic mood, appropriate affect and demeanor  Assessment & Plan:    Coronary artery disease involving native coronary artery of native heart without angina pectoris -     Birch.brandt; Inject 0.25 mg into the skin once a week.  Dispense: 2 mL; Refill: 0  Class 3 drug-induced obesity with serious comorbidity and body mass index (BMI) of 45.0 to 49.9 in adult [facility]) -     Tzhncb; Inject 0.25 mg into the skin once a week.  Dispense: 2 mL; Refill: 0 Continue to watch diet - increase exercise Anxiety Continue current meds    Follow-up: Return in about 5 months (around 12/17/2024) for chronic fasting follow-up.  An After Visit Summary was printed and given to the patient.  CAMIE JONELLE NICHOLAUS DEVONNA [facility] [phone]

## 2024-07-21 ENCOUNTER — Other Ambulatory Visit: Payer: Self-pay | Admitting: Physician Assistant

## 2024-07-21 DIAGNOSIS — R9439 Abnormal result of other cardiovascular function study: Secondary | ICD-10-CM

## 2024-07-22 ENCOUNTER — Other Ambulatory Visit: Payer: Self-pay | Admitting: Physician Assistant

## 2024-07-22 DIAGNOSIS — E039 Hypothyroidism, unspecified: Secondary | ICD-10-CM

## 2024-08-02 LAB — COLOGUARD: COLOGUARD: NEGATIVE

## 2024-08-04 ENCOUNTER — Telehealth: Admitting: Emergency Medicine

## 2024-08-04 DIAGNOSIS — R6889 Other general symptoms and signs: Secondary | ICD-10-CM

## 2024-08-04 MED ORDER — OSELTAMIVIR PHOSPHATE 75 MG PO CAPS: 75.0000 mg | ORAL_CAPSULE | Freq: Two times a day (BID) | ORAL | 0 refills | Status: AC

## 2024-08-04 MED ORDER — BENZONATATE 100 MG PO CAPS: 100.0000 mg | ORAL_CAPSULE | Freq: Two times a day (BID) | ORAL | 0 refills | Status: AC | PRN

## 2024-08-04 NOTE — Progress Notes (Signed)
 Message sent to patient requesting further input regarding current symptoms. Awaiting patient response.

## 2024-08-04 NOTE — Progress Notes (Signed)
 E visit for Flu like symptoms   We are sorry that you are not feeling well.  Here is how we plan to help! Based on what you have shared with me it looks like you may have a respiratory virus that may be influenza.  Influenza or the flu is  an infection caused by a respiratory virus. The flu virus is highly contagious and persons who did not receive their yearly flu vaccination may catch the flu from close contact.  We have anti-viral medications to treat the viruses that cause this infection. They are not a cure and only shorten the course of the infection. These prescriptions are most effective when they are given within the first 2 days of flu symptoms. Antiviral medications are indicated if you have a high risk of complications from the flu. You should  also consider an antiviral medication if you are in close contact with someone who is at risk. These medications can help patients avoid complications from the flu but have side effects that you should know.   Possible side effects from Tamiflu or oseltamivir include nausea, vomiting, diarrhea, dizziness, headaches, eye redness, sleep problems or other respiratory symptoms. You should not take Tamiflu if you have an allergy to oseltamivir or any to the ingredients in Tamiflu.  Based upon your symptoms and potential risk factors I have prescribed Oseltamivir (Tamiflu).  It has been sent to your designated pharmacy.  You will take one 75 mg capsule orally twice a day for the next 5 days.   For nasal congestion, you may use an oral decongestant such as Mucinex D or if you have glaucoma or high blood pressure use plain Mucinex.  Saline nasal spray or nasal drops can help and can safely be used as often as needed for congestion.  If you have a sore or scratchy throat, use a saltwater gargle-  to  teaspoon of salt dissolved in a 4-ounce to 8-ounce glass of warm water.  Gargle the solution for approximately 15-30 seconds and then spit.  It is  important not to swallow the solution.  You can also use throat lozenges/cough drops and Chloraseptic spray to help with throat pain or discomfort.  Warm or cold liquids can also be helpful in relieving throat pain.  For headache, pain or general discomfort, you can use Ibuprofen  or Tylenol  as directed.   Some authorities believe that zinc sprays or the use of Echinacea may shorten the course of your symptoms.  I have prescribed the following medications to help lessen symptoms: I have prescribed Tessalon  Perles 100 mg. You may take 1-2 capsules every 8 hours as needed for cough  You are to isolate at home until you have been fever-free for at least 24 hours without a fever-reducing medication, and symptoms have been steadily improving for 24 hours.  If you must be around other household members who do not have symptoms, you need to make sure that both you and the family members are masking consistently with a high-quality mask.  If you note any worsening of symptoms despite treatment, please seek an in-person evaluation ASAP. If you note any significant shortness of breath or any chest pain, please seek ED evaluation. Please do not delay care!  ANYONE WHO HAS FLU SYMPTOMS SHOULD: Stay home. The flu is highly contagious and going out or to work exposes others! Be sure to drink plenty of fluids. Water is fine as well as fruit juices, sodas and electrolyte beverages. You may want to stay  away from caffeine or alcohol. If you are nauseated, try taking small sips of liquids. How do you know if you are getting enough fluid? Your urine should be a pale yellow or almost colorless. Get rest. Taking a steamy shower or using a humidifier may help nasal congestion and ease sore throat pain. Using a saline nasal spray works much the same way. Cough drops, hard candies and sore throat lozenges may ease your cough. Line up a caregiver. Have someone check on you regularly.  GET HELP RIGHT AWAY IF: You cannot  keep down liquids or your medications. You become short of breath Your fell like you are going to pass out or loose consciousness. Your symptoms persist after you have completed your treatment plan  MAKE SURE YOU  Understand these instructions. Will watch your condition. Will get help right away if you are not doing well or get worse.  Your e-visit answers were reviewed by a board certified advanced clinical practitioner to complete your personal care plan.  Depending on the condition, your plan could have included both over the counter or prescription medications.  If there is a problem please reply  once you have received a response from your provider.  Your safety is important to us .  If you have drug allergies check your prescription carefully.    You can use MyChart to ask questions about todays visit, request a non-urgent call back, or ask for a work or school excuse for 24 hours related to this e-Visit. If it has been greater than 24 hours you will need to follow up with your provider, or enter a new e-Visit to address those concerns.  You will get an e-mail in the next two days asking about your experience.  I hope that your e-visit has been valuable and will speed your recovery. Thank you for using e-visits.   I have spent 5 minutes in review of e-visit questionnaire, review and updating patient chart, medical decision making and response to patient.   Lamar Schlossman, PA-C

## 2024-08-09 ENCOUNTER — Encounter: Payer: Self-pay | Admitting: Physician Assistant

## 2024-08-09 ENCOUNTER — Telehealth: Admitting: Physician Assistant

## 2024-08-09 VITALS — BP 130/72 | Temp 98.7°F | Ht 65.5 in | Wt 286.0 lb

## 2024-08-09 DIAGNOSIS — J40 Bronchitis, not specified as acute or chronic: Secondary | ICD-10-CM

## 2024-08-09 MED ORDER — HYDROCODONE BIT-HOMATROP MBR 5-1.5 MG/5ML PO SOLN: 5.0000 mL | Freq: Four times a day (QID) | ORAL | 0 refills | Status: AC | PRN

## 2024-08-09 MED ORDER — PREDNISONE 20 MG PO TABS: ORAL_TABLET | ORAL | 0 refills | Status: AC

## 2024-08-09 MED ORDER — DOXYCYCLINE HYCLATE 100 MG PO TABS: 100.0000 mg | ORAL_TABLET | Freq: Two times a day (BID) | ORAL | 0 refills | Status: AC

## 2024-08-09 NOTE — Progress Notes (Signed)
 "   Virtual Visit via Telephone Note   This visit type was conducted due to national recommendations for restrictions regarding the COVID-19 Pandemic (e.g. social distancing) in an effort to limit this patient's exposure and mitigate transmission in our community.  Due to her co-morbid illnesses, this patient is at least at moderate risk for complications without adequate follow up.  This format is felt to be most appropriate for this patient at this time.  The patient did not have access to video technology/had technical difficulties with video requiring transitioning to audio format only (telephone).  All issues noted in this document were discussed and addressed.  No physical exam could be performed with this format.  Patient verbally consented to a telehealth visit.   Date:  08/09/2024   ID:  Kristen Vazquez, DOB 1965-09-11, MRN [id number]  Patient Location: Home Provider Location: Office  PCP:  Nicholaus Credit, PA-C   Chief Complaint:  cough and congestion  History of Present Illness:    Kristen Vazquez is a 58 y.o. female with complaints of cough, congestion with sore throat - states she is using tessalon  perles but not helpful. She has proair  and breztri   at home but not using (forgot ) - she has had malaise, achiness Did have low grade fever last week -   The patient does have symptoms concerning for COVID-19 infection (fever, chills, cough, or new shortness of breath). But is over a week out with symptoms   Past Medical History:  Diagnosis Date   Abnormal blood chemistry 01/02/2020   Acquired hypothyroidism 12/14/2018   Acute bilateral low back pain with right-sided sciatica 01/17/2024   Acute laryngopharyngitis 01/02/2020   Acute pain of right shoulder due to trauma 10/23/2020   Acute sinusitis 08/19/2021   Anterolisthesis of lumbar spine 02/24/2024   Anxiety 04/09/2020   Asthma    CAD (coronary artery disease);abnml stress MPI 04/2023; abnml CT cors 09/2023 mid LAD and RCA;  s/p cath 11/11/2023 PCI of mid LAD 11/04/2023   Symptoms of dyspnea on exertion suggestive of angina and progressive over the past 2 to 3 months.  Prior abnormal stress test with nuclear imaging from September 2024  Abnormal cardiac CT imaging on 10-12-2023 with CAD RADS 3 study moderate 50 to 69% lesion of LAD and RCA with CT FFR abnormal for possible hemodynamically significant lesions in mid to distal LAD.  Calcium  score 526 and total plaque    Clonus 03/09/2024   Controlled type 2 diabetes mellitus without complication, without long-term current use of insulin (HCC) 03/30/2023   COPD (chronic obstructive pulmonary disease) (HCC)    Depression    Dyspnea on exertion 03/30/2023   Encounter for screening mammogram for breast cancer 09/26/2019   Gastroesophageal reflux disease without esophagitis 12/14/2018   GERD (gastroesophageal reflux disease)    Hypertension 11/04/2023   Kidney stones    Major depressive disorder, recurrent episode, moderate (HCC) 08/23/2018   Menorrhagia with irregular cycle 04/01/2021   Mild intermittent asthma 09/26/2019   Mixed hyperlipidemia 09/26/2019   Morbid obesity (HCC) 02/24/2024   Pain of right upper extremity 10/23/2020   Pelvic organ prolapse quantification stage 1 cystocele 04/01/2021   Pulmonary hypertension, primary (HCC) 03/09/2024   Spondylolysis of lumbar region 01/26/2024   Spondylolysis, lumbar region 02/24/2024   Status post hysteroscopy 05/21/2021   Past Surgical History:  Procedure Laterality Date   CORONARY PRESSURE/FFR STUDY N/A 11/11/2023   Procedure: CORONARY PRESSURE/FFR STUDY;  Surgeon: Elmira Newman PARAS, MD;  Location: [facility]  INVASIVE CV LAB;  Service: Cardiovascular;  Laterality: N/A;  LAD   CORONARY STENT INTERVENTION N/A 11/11/2023   Procedure: CORONARY STENT INTERVENTION;  Surgeon: Elmira Newman PARAS, MD;  Location: [facility] INVASIVE CV LAB;  Service: Cardiovascular;  Laterality: N/A;   HERNIA REPAIR  2014   LEFT HEART CATH AND CORONARY  ANGIOGRAPHY N/A 11/11/2023   Procedure: LEFT HEART CATH AND CORONARY ANGIOGRAPHY;  Surgeon: Elmira Newman PARAS, MD;  Location: [facility] INVASIVE CV LAB;  Service: Cardiovascular;  Laterality: N/A;     Active Medications[1]   Allergies:   Codeine, Sulfa antibiotics, and Coconut (cocos nucifera)   Social History[2]   Family Hx: The patient's family history includes Alcohol abuse in her brother; Breast cancer in her mother; Heart failure in her brother; Leukemia in her father.  ROS:   Please see the history of present illness.    All other systems reviewed and are negative.  Labs/Other Tests and Data Reviewed:    Recent Labs: 06/27/2024: ALT 29; BUN 11; Creatinine, Ser 0.62; Hemoglobin 13.8; Platelets 245; Potassium 5.1; Sodium 140; TSH 1.150   Recent Lipid Panel Lab Results  Component Value Date/Time   CHOL 133 06/27/2024 08:40 AM   TRIG 141 06/27/2024 08:40 AM   HDL 44 06/27/2024 08:40 AM   CHOLHDL 3.0 06/27/2024 08:40 AM   LDLCALC 64 06/27/2024 08:40 AM    Wt Readings from Last 3 Encounters:  08/09/24 286 lb (129.7 kg)  07/19/24 286 lb (129.7 kg)  06/27/24 293 lb 9.6 oz (133.2 kg)     Objective:    Vital Signs:  BP 130/72   Temp 98.7 F (37.1 C)   Ht 5' 5.5 (1.664 m)   Wt 286 lb (129.7 kg)   LMP  (LMP Unknown)   BMI 46.87 kg/m    VITAL SIGNS:  reviewed GEN - no acute distress - coughing during video exam ASSESSMENT & PLAN:    Bronchitis - rx for doxycycline  , hydromet and prednisone  Recommend use inhalers as directed  COVID-19 Education: The signs and symptoms of COVID-19 were discussed with the patient and how to seek care for testing (follow up with PCP or arrange E-visit). The importance of social distancing was discussed today.  Time:   Today, I have spent 10 minutes with the patient with telehealth technology discussing the above problems.     Medication Adjustments/Labs and Tests Ordered: Current medicines are reviewed at length with the patient  today.  Concerns regarding medicines are outlined above.   Tests Ordered: No orders of the defined types were placed in this encounter.   Medication Changes: No orders of the defined types were placed in this encounter.   Follow Up:  In Person prn  Signed, CAMIE JONELLE MOATS, PA-C  08/09/2024 8:44 AM    [facility] [place]      [1]  No outpatient medications have been marked as taking for the 08/09/24 encounter (Telemedicine) with Moats Camie, PA-C.  [2]  Social History Tobacco Use   Smoking status: Former    Current packs/day: 0.00    Average packs/day: 1 pack/day for 30.0 years (30.0 ttl pk-yrs)    Types: Cigarettes    Start date: 61    Quit date: 2012    Years since quitting: 13.9   Smokeless tobacco: Never  Vaping Use   Vaping status: Never Used  Substance Use Topics   Alcohol use: Yes    Alcohol/week: 2.0 standard drinks of alcohol    Types: 2 Glasses of wine  per week    Comment: SOCIAL   Drug use: Never   "

## 2024-08-24 ENCOUNTER — Other Ambulatory Visit: Payer: Self-pay

## 2024-08-24 MED ORDER — CLOPIDOGREL BISULFATE 75 MG PO TABS: 75.0000 mg | ORAL_TABLET | Freq: Every day | ORAL | 3 refills | Status: AC

## 2024-09-16 ENCOUNTER — Other Ambulatory Visit: Payer: Self-pay | Admitting: Physician Assistant

## 2024-09-16 DIAGNOSIS — E559 Vitamin D deficiency, unspecified: Secondary | ICD-10-CM

## 2024-12-19 ENCOUNTER — Ambulatory Visit: Admitting: Physician Assistant
